# Patient Record
Sex: Male | Born: 1960
Health system: Southern US, Community
[De-identification: ages and names within clinical notes are randomized; demographics above are authoritative.]

## PROBLEM LIST (undated history)

## (undated) DIAGNOSIS — K6389 Other specified diseases of intestine: Secondary | ICD-10-CM

## (undated) DIAGNOSIS — I10 Essential (primary) hypertension: Secondary | ICD-10-CM

## (undated) DIAGNOSIS — E785 Hyperlipidemia, unspecified: Secondary | ICD-10-CM

## (undated) DIAGNOSIS — L989 Disorder of the skin and subcutaneous tissue, unspecified: Secondary | ICD-10-CM

## (undated) DIAGNOSIS — J342 Deviated nasal septum: Secondary | ICD-10-CM

## (undated) DIAGNOSIS — T7840XA Allergy, unspecified, initial encounter: Secondary | ICD-10-CM

## (undated) HISTORY — DX: Essential (primary) hypertension: I10

## (undated) HISTORY — DX: Other specified diseases of intestine: K63.89

## (undated) HISTORY — PX: WISDOM TOOTH EXTRACTION: SHX21

## (undated) HISTORY — DX: Hyperlipidemia, unspecified: E78.5

## (undated) HISTORY — DX: Allergy, unspecified, initial encounter: T78.40XA

## (undated) HISTORY — PX: VASECTOMY: SHX75

## (undated) HISTORY — PX: COLONOSCOPY: SHX174

## (undated) HISTORY — DX: Disorder of the skin and subcutaneous tissue, unspecified: L98.9

---

## 2005-04-01 ENCOUNTER — Ambulatory Visit: Payer: Self-pay | Admitting: Family Medicine

## 2006-10-06 ENCOUNTER — Ambulatory Visit: Payer: Self-pay | Admitting: Family Medicine

## 2006-10-06 LAB — CONVERTED CEMR LAB
Albumin: 4.5 g/dL (ref 3.5–5.2)
BUN: 12 mg/dL (ref 6–23)
Basophils Absolute: 0 10*3/uL (ref 0.0–0.1)
CO2: 29 meq/L (ref 19–32)
Calcium: 9.6 mg/dL (ref 8.4–10.5)
Cholesterol: 222 mg/dL (ref 0–200)
Glomerular Filtration Rate, Af Am: 116 mL/min/{1.73_m2}
Glucose, Bld: 89 mg/dL (ref 70–99)
HDL: 44.8 mg/dL (ref >39.0)
LDL DIRECT: 147.5 mg/dL
MCV: 94.4 fL (ref 78.0–100.0)
Monocytes Relative: 9 % (ref 3.0–11.0)
Neutrophils Relative %: 56.8 % (ref 43.0–77.0)
Platelets: 261 10*3/uL (ref 150–400)
RBC: 5 M/uL (ref 4.22–5.81)
Sodium: 143 meq/L (ref 135–145)
Total Bilirubin: 1 mg/dL (ref 0.3–1.2)
WBC: 6.1 10*3/uL (ref 4.5–10.5)

## 2006-10-13 ENCOUNTER — Ambulatory Visit: Payer: Self-pay | Admitting: Family Medicine

## 2008-05-20 DIAGNOSIS — R310 Gross hematuria: Secondary | ICD-10-CM | POA: Insufficient documentation

## 2008-05-21 ENCOUNTER — Ambulatory Visit: Payer: Self-pay | Admitting: Family Medicine

## 2008-05-21 LAB — CONVERTED CEMR LAB
Glucose, Urine, Semiquant: NEGATIVE
Protein, U semiquant: 30
Urobilinogen, UA: 0.2

## 2008-06-11 ENCOUNTER — Ambulatory Visit: Payer: Self-pay | Admitting: Family Medicine

## 2008-06-11 DIAGNOSIS — R030 Elevated blood-pressure reading, without diagnosis of hypertension: Secondary | ICD-10-CM | POA: Insufficient documentation

## 2008-06-11 LAB — CONVERTED CEMR LAB
Ketones, urine, test strip: NEGATIVE
Protein, U semiquant: NEGATIVE
Urobilinogen, UA: 0.2
WBC Urine, dipstick: NEGATIVE

## 2008-06-25 ENCOUNTER — Ambulatory Visit: Payer: Self-pay | Admitting: Family Medicine

## 2008-06-25 DIAGNOSIS — J309 Allergic rhinitis, unspecified: Secondary | ICD-10-CM | POA: Insufficient documentation

## 2008-06-25 DIAGNOSIS — I10 Essential (primary) hypertension: Secondary | ICD-10-CM | POA: Insufficient documentation

## 2008-08-01 ENCOUNTER — Ambulatory Visit: Payer: Self-pay | Admitting: Family Medicine

## 2008-09-28 DIAGNOSIS — M5137 Other intervertebral disc degeneration, lumbosacral region: Secondary | ICD-10-CM | POA: Insufficient documentation

## 2008-09-28 DIAGNOSIS — M51379 Other intervertebral disc degeneration, lumbosacral region without mention of lumbar back pain or lower extremity pain: Secondary | ICD-10-CM | POA: Insufficient documentation

## 2008-10-08 ENCOUNTER — Ambulatory Visit: Payer: Self-pay | Admitting: Family Medicine

## 2008-10-10 ENCOUNTER — Ambulatory Visit: Payer: Self-pay | Admitting: Family Medicine

## 2009-05-04 DIAGNOSIS — J3489 Other specified disorders of nose and nasal sinuses: Secondary | ICD-10-CM | POA: Insufficient documentation

## 2009-05-07 ENCOUNTER — Ambulatory Visit: Payer: Self-pay | Admitting: Family Medicine

## 2009-06-09 ENCOUNTER — Telehealth: Payer: Self-pay | Admitting: Family Medicine

## 2009-06-18 ENCOUNTER — Telehealth: Payer: Self-pay | Admitting: Family Medicine

## 2009-09-10 ENCOUNTER — Telehealth: Payer: Self-pay | Admitting: *Deleted

## 2010-04-02 ENCOUNTER — Ambulatory Visit: Payer: Self-pay | Admitting: Family Medicine

## 2010-04-02 LAB — CONVERTED CEMR LAB
ALT: 31 units/L (ref 0–53)
Blood in Urine, dipstick: NEGATIVE
CO2: 30 meq/L (ref 19–32)
Chloride: 109 meq/L (ref 96–112)
Cholesterol: 209 mg/dL — ABNORMAL HIGH (ref 0–200)
Creatinine, Ser: 0.9 mg/dL (ref 0.4–1.5)
Eosinophils Relative: 0.9 % (ref 0.0–5.0)
HCT: 44.2 % (ref 39.0–52.0)
HDL: 41.7 mg/dL (ref >39.00)
Ketones, urine, test strip: NEGATIVE
Lymphocytes Relative: 25.2 % (ref 12.0–46.0)
MCHC: 34.7 g/dL (ref 30.0–36.0)
Monocytes Relative: 6.1 % (ref 3.0–12.0)
PSA: 0.37 ng/mL (ref 0.10–4.00)
Platelets: 243 10*3/uL (ref 150.0–400.0)
Potassium: 4.4 meq/L (ref 3.5–5.1)
RBC: 4.75 M/uL (ref 4.22–5.81)
Specific Gravity, Urine: 1.02
TSH: 1.11 microintl units/mL (ref 0.35–5.50)
Total Bilirubin: 0.5 mg/dL (ref 0.3–1.2)
Total Protein: 7.3 g/dL (ref 6.0–8.3)
Triglycerides: 116 mg/dL (ref 0.0–149.0)
Urobilinogen, UA: 0.2
WBC Urine, dipstick: NEGATIVE

## 2010-04-09 ENCOUNTER — Ambulatory Visit: Payer: Self-pay | Admitting: Family Medicine

## 2010-04-09 DIAGNOSIS — L2089 Other atopic dermatitis: Secondary | ICD-10-CM | POA: Insufficient documentation

## 2010-04-09 DIAGNOSIS — N529 Male erectile dysfunction, unspecified: Secondary | ICD-10-CM | POA: Insufficient documentation

## 2010-04-13 ENCOUNTER — Ambulatory Visit: Payer: Self-pay | Admitting: Family Medicine

## 2010-04-14 ENCOUNTER — Telehealth: Payer: Self-pay | Admitting: Family Medicine

## 2010-08-18 ENCOUNTER — Encounter: Payer: Self-pay | Admitting: Family Medicine

## 2011-01-18 ENCOUNTER — Telehealth: Payer: Self-pay | Admitting: Family Medicine

## 2011-01-26 NOTE — Progress Notes (Signed)
Summary: Pt returning call re: xray results. Pls call back asap  Phone Note Call from Patient Call back at [phone] cell   Caller: Patient Summary of Call: Pt returned call re: xray results. Pls call back asap.  Initial call taken by: Lucy Antigua,  April 14, 2010 8:41 AM  Follow-up for Phone Call        Phone Call Completed Follow-up by: Kern Reap CMA Duncan Dull),  April 14, 2010 9:03 AM

## 2011-01-26 NOTE — Assessment & Plan Note (Signed)
Summary: CPX // RS   Vital Signs:  Patient profile:   50 year old male Height:      71.25 inches Weight:      205 pounds BMI:     28.49 Temp:     98.4 degrees F oral BP sitting:   120 / 86  (left arm) Cuff size:   regular  Vitals Entered By: Kern Reap CMA Duncan Dull) (April 09, 2010 3:49 PM) CC: cpx   CC:  cpx.  History of Present Illness: Sherri is a 50 year old, married male, nonsmoker comes in today for physical examination  He has underlying hypertension, treated with Zestoretic 20 -- 12.5 daily.  BP 120/86.  He's been off smokeless tobacco now for about a year.  He tried to stand checks, but he had side effects.  He used patches finally, he use the nicotine gum.  He strained position from nicotine gum now to regular gum.  These concerned now because he chews gum.  All day long.  He has some postnasal drip.  No change in his voice.  No hemoptysis, etc., but he is worried about throat cancer.  He gets routine eye care.  Dental care.  Colonoscopy will be done.  This year.  Tetanus 1999.  He also symptoms of postnasal drip allergic rhinitis.  Will try him on a trial of plain Zyrtec at bedtime  He's also complaining of right-sided low back pain, which he's had for many years.  Now he states the pain is pretty much constant it's a 6 on a scale of one to 10.  It is relieved by rest, increased by sitting.  No history of trauma.  It does not radiate down his legs.  He denies any neurologic symptoms.  He also has some mild ED, and would like to discuss treatment options  Allergies (verified): No Known Drug Allergies  Past History:  Past medical, surgical, family and social histories (including risk factors) reviewed, and no changes noted (except as noted below).  Past Medical History: Reviewed history from 06/25/2008 and no changes required. Allergic rhinitis Hypertension  Family History: Reviewed history from 06/11/2008 and no changes required. Family History  Hypertension  Social History: Reviewed history from 06/11/2008 and no changes required. Occupation: Married Never Smoked Alcohol use-no Drug use-no Regular exercise-yes  Review of Systems      See HPI  Physical Exam  General:  Well-developed,well-nourished,in no acute distress; alert,appropriate and cooperative throughout examination Head:  Normocephalic and atraumatic without obvious abnormalities. No apparent alopecia or balding. Eyes:  No corneal or conjunctival inflammation noted. EOMI. Perrla. Funduscopic exam benign, without hemorrhages, exudates or papilledema. Vision grossly normal. Ears:  External ear exam shows no significant lesions or deformities.  Otoscopic examination reveals clear canals, tympanic membranes are intact bilaterally without bulging, retraction, inflammation or discharge. Hearing is grossly normal bilaterally. Nose:  External nasal examination shows no deformity or inflammation. Nasal mucosa are pink and moist without lesions or exudates. Mouth:  Oral mucosa and oropharynx without lesions or exudates.  Teeth in good repair. Neck:  No deformities, masses, or tenderness noted. Chest Wall:  No deformities, masses, tenderness or gynecomastia noted. Breasts:  No masses or gynecomastia noted Lungs:  Normal respiratory effort, chest expands symmetrically. Lungs are clear to auscultation, no crackles or wheezes. Heart:  Normal rate and regular rhythm. S1 and S2 normal without gallop, murmur, click, rub or other extra sounds. Abdomen:  Bowel sounds positive,abdomen soft and non-tender without masses, organomegaly or hernias noted. Rectal:  No  external abnormalities noted. Normal sphincter tone. No rectal masses or tenderness. Genitalia:  Testes bilaterally descended without nodularity, tenderness or masses. No scrotal masses or lesions. No penis lesions or urethral discharge. Prostate:  Prostate gland firm and smooth, no enlargement, nodularity, tenderness, mass,  asymmetry or induration. Msk:  No deformity or scoliosis noted of thoracic or lumbar spine.   Pulses:  R and L carotid,radial,femoral,dorsalis pedis and posterior tibial pulses are full and equal bilaterally Extremities:  No clubbing, cyanosis, edema, or deformity noted with normal full range of motion of all joints.   Neurologic:  No cranial nerve deficits noted. Station and gait are normal. Plantar reflexes are down-going bilaterally. DTRs are symmetrical throughout. Sensory, motor and coordinative functions appear intact. Skin:  Intact without suspicious lesions or rashes Cervical Nodes:  No lymphadenopathy noted Axillary Nodes:  No palpable lymphadenopathy Inguinal Nodes:  No significant adenopathy Psych:  Cognition and judgment appear intact. Alert and cooperative with normal attention span and concentration. No apparent delusions, illusions, hallucinations   Problems:  Medical Problems Added: 1)  Dx of Preventive Health Care  (ICD-V70.0) 2)  Dx of Eczema, Atopic  (ICD-691.8) 3)  Dx of Erectile Dysfunction, Organic  (VWU-981.19)  Impression & Recommendations:  Problem # 1:  DISC DISEASE, LUMBAR (ICD-722.52) Assessment Deteriorated  Orders: T-Lumbar Spine w/Flex & Ext 4 Views (14782NF)  Problem # 2:  HYPERTENSION (ICD-401.9) Assessment: Improved  His updated medication list for this problem includes:    Zestoretic 10-12.5 Mg Tabs (Lisinopril-hydrochlorothiazide) .Marland Kitchen... Take 1 tablet by mouth every morning  Orders: EKG w/ Interpretation (93000)  Problem # 3:  ALLERGIC RHINITIS (ICD-477.9) Assessment: Unchanged  Problem # 4:  GROSS HEMATURIA (ICD-599.71) Assessment: Improved  His updated medication list for this problem includes:    Keflex 500 Mg Caps (Cephalexin) .Marland Kitchen... 2 by mouth two times a day  Problem # 5:  ERECTILE DYSFUNCTION, ORGANIC (ICD-607.84) Assessment: New  His updated medication list for this problem includes:    Viagra 50 Mg Tabs (Sildenafil citrate)  ..... Uad  Problem # 6:  ECZEMA, ATOPIC (ICD-691.8)  His updated medication list for this problem includes:    Triamcinolone Acetonide 0.5 % Crea (Triamcinolone acetonide) .Marland Kitchen... Apply to feet two times a day    Fluocinonide 0.05 % Oint (Fluocinonide) .Marland Kitchen... Compound with salac 10% apply to feet two times a day  Complete Medication List: 1)  Zestoretic 10-12.5 Mg Tabs (Lisinopril-hydrochlorothiazide) .... Take 1 tablet by mouth every morning 2)  Keflex 500 Mg Caps (Cephalexin) .... 2 by mouth two times a day 3)  Triamcinolone Acetonide 0.5 % Crea (Triamcinolone acetonide) .... Apply to feet two times a day 4)  Fluocinonide 0.05 % Oint (Fluocinonide) .... Compound with salac 10% apply to feet two times a day 5)  Viagra 50 Mg Tabs (Sildenafil citrate) .... Uad  Other Orders: Gastroenterology Referral (GI) Tdap => 25yrs IM (62130) Admin 1st Vaccine (86578)  Patient Instructions: 1)  call Dr. Narda Bonds, ENT, for consultation. 2)  Go to the main office for your back.  X-rays .  I will call u   The report and we  will go from there on that issue 3)  Viagra 50 mg one half tablet one hour prior to sex. 4)  Continue the Zestoretic one tab daily for your hypertension. 5)  Takes Zyrtec 10 mg plain ...n o d ...... at bedtime to see if this helps with the postnasal drip 6)  y r  due for a screening colonoscopy.  I  will set that up for you Prescriptions: FLUOCINONIDE 0.05 % OINT (FLUOCINONIDE) compound with salac 10% apply to feet two times a day  #60 gr x 6   Entered and Authorized by:   Roderick Pee MD   Signed by:   Roderick Pee MD on 04/09/2010   Method used:   Electronically to        [facility]* (retail)       [address]       [place], [place]  [address]       Ph: [phone]       Fax: [phone]   RxID:   [id number] ZESTORETIC 10-12.5 MG  TABS (LISINOPRIL-HYDROCHLOROTHIAZIDE) Take 1 tablet by mouth every morning  #100 x 3   Entered and  Authorized by:   Roderick Pee MD   Signed by:   Roderick Pee MD on 04/09/2010   Method used:   Electronically to        [facility]* (retail)       [address]       [place], [place]  [address]       Ph: [phone]       Fax: [phone]   RxID:   [id number] VIAGRA 50 MG TABS (SILDENAFIL CITRATE) UAD  #6 x 11   Entered and Authorized by:   Roderick Pee MD   Signed by:   Roderick Pee MD on 04/09/2010   Method used:   Electronically to        [facility]* (retail)       [address]       [place], [place]  [address]       Ph: [phone]       Fax: [phone]   RxID:   [id number]    Immunizations Administered:  Tetanus Vaccine:    Vaccine Type: Tdap    Site: left deltoid    Mfr: GlaxoSmithKline    Dose: 0.5 ml    Route: IM    Given by: Kern Reap CMA (AAMA)    Exp. Date: 03/20/2012    Lot #: [id number]    Physician counseled: yes

## 2011-01-26 NOTE — Letter (Signed)
Summary: Referral - not able to see patient  [facility]  [address] [place], [place] [address]   Phone: [phone]  Fax: [phone]    August 18, 2010    Tinnie Gens A. Tawanna Cooler, M.D. [address] [place], [place] [address]   Re:   Jesse Arroyo DOB:  [birth year] MRN:   [id number]    Dear Dr. Tawanna Cooler:  Thank you for your kind referral of the above patient.  We have attempted to schedule the recommended procedure Screening Colonoscopy but have not been able to schedule because:   X  The patient was not available by phone and/or has not returned our calls.  ___ The patient declined to schedule the procedure at this time.  We appreciate the referral and hope that we will have the opportunity to treat this patient in the future.    Sincerely,    [facility] [phone]

## 2011-01-28 NOTE — Progress Notes (Signed)
Summary: Pt req refill of antibiotic for nose infection. [facility] Note Call from Patient Call back at Home Phone [phone]   Caller: spouse - Britta Mccreedy Summary of Call: Pt has gotten the same infection up in his nose, as he did before. Pt is req the antibiotic Dr Tawanna Cooler prescribed before, but maybe stronger, since infection keep recurring. If pt needs ov, pls advise. Otherwise, [facility]s [facility] [address] [phone] Initial call taken by: Lucy Antigua,  January 18, 2011 2:33 PM    New/Updated Medications: DOXYCYCLINE HYCLATE 100 MG CAPS (DOXYCYCLINE HYCLATE) Take 1 tablet by mouth two times a day SEPTRA DS 800-160 MG TABS (SULFAMETHOXAZOLE-TRIMETHOPRIM) Take 1 tablet by mouth two times a day Prescriptions: SEPTRA DS 800-160 MG TABS (SULFAMETHOXAZOLE-TRIMETHOPRIM) Take 1 tablet by mouth two times a day  #30 x 2   Entered and Authorized by:   Roderick Pee MD   Signed by:   Roderick Pee MD on 01/18/2011   Method used:   Electronically to        [phone][facility] (retail)       [address]       [place], [place]  [address]       Ph: [phone] or [phone]       Fax: [phone]   RxID:   [id number] DOXYCYCLINE HYCLATE 100 MG CAPS (DOXYCYCLINE HYCLATE) Take 1 tablet by mouth two times a day  #30 x 2   Entered and Authorized by:   Roderick Pee MD   Signed by:   Roderick Pee MD on 01/18/2011   Method used:   Electronically to        [phone] (retail)       [address]       [place], [place]  [address]       Ph: [phone] or [phone]       Fax: [phone]   RxID:   [id number]

## 2011-05-19 ENCOUNTER — Encounter: Payer: Self-pay | Admitting: Family Medicine

## 2011-05-20 ENCOUNTER — Ambulatory Visit: Payer: Self-pay | Admitting: Family Medicine

## 2011-06-22 ENCOUNTER — Other Ambulatory Visit: Payer: Self-pay | Admitting: Family Medicine

## 2011-12-28 HISTORY — PX: COLON RESECTION: SHX5231

## 2012-01-20 ENCOUNTER — Encounter: Payer: Self-pay | Admitting: Family Medicine

## 2012-01-20 ENCOUNTER — Ambulatory Visit (INDEPENDENT_AMBULATORY_CARE_PROVIDER_SITE_OTHER): Payer: 59 | Admitting: Family Medicine

## 2012-01-20 DIAGNOSIS — K5900 Constipation, unspecified: Secondary | ICD-10-CM

## 2012-01-20 DIAGNOSIS — K5909 Other constipation: Secondary | ICD-10-CM

## 2012-01-20 DIAGNOSIS — L2089 Other atopic dermatitis: Secondary | ICD-10-CM

## 2012-01-20 DIAGNOSIS — M722 Plantar fascial fibromatosis: Secondary | ICD-10-CM

## 2012-01-20 MED ORDER — TRIAMCINOLONE ACETONIDE 0.5 % EX CREA: 1.0000 "application " | TOPICAL_CREAM | Freq: Three times a day (TID) | CUTANEOUS | Status: DC

## 2012-01-20 NOTE — Progress Notes (Signed)
  Subjective:    Patient ID: Jesse Arroyo, male    DOB: 29-Aug-1961, 51 y.o.   MRN: [id number]  HPI Jesse Arroyo is a 51 year old, married man nonsmoker, who comes in today for evaluation of 3 problems.  He states for the past 3 months.  He said changes in his bowel habits.  He feels like he has to go and can go.  He's tried some fleets enemas to no avail.  He has some discomfort in the right lower quadrant.  It comes and goes, and he describes Pencil  type stools.  He said no fever, nausea, vomiting, diarrhea.  He also has a lot of gas.  Some of which sounds like a lactase issue.  We had asked him to get a colonoscopy when he turns 48, but he hasn't done so yet.  Family history negative for GI disease.  He also has pain in his right heel for the past couple weeks.  He also has eczema and needs a refill on a steroid cream and he has an ingrown the fifth toenail.   Review of Systems    General GI, musculoskeletal review of systems otherwise negative Objective:   Physical Exam  Well-developed well-nourished, male in no acute distress.  Examination the abdomen the abdomen is flat.  The bowel sounds are normal.  There is some tenderness right lower quadrant.  No rebound.  No palpable masses.  Rectal exam normal prostate normal stool guaiac-negative.  The exam of the right foot shows tenderness consistent in the heel with plantar fasciitis.  He has an ingrown fifth toenail that I trimmed and excised.  He has eczema on his feet.      Assessment & Plan:  Symptoms of gas and change in bowel habits.  Plan lactose-free diet, stool softener, GI consult ASAP for colonoscopy.  Plantar fasciitis Motrin, 600 b.i.d. Exercises given.  Eczema.  Triamcinolone with moisturizing cream nightly.  Ingrown toenail fifth right foot, soak an d file   weekly

## 2012-01-20 NOTE — Patient Instructions (Signed)
To apply triamcinolone, and a moisturizing cream nightly   Soak  and follow the right fifth toenail weekly and fill it you can remove the nail completely.  Complete lactose free diet.  We will set you up in GI ASAP for a colonoscopy.  Motrin 600 mg twice daily with food and stretching exercises daily in the morning before you get out of bed for the plantar fasciitis.  Milk of Magnesia 3 tablespoons twice daily

## 2012-01-21 ENCOUNTER — Encounter: Payer: Self-pay | Admitting: Gastroenterology

## 2012-02-04 ENCOUNTER — Ambulatory Visit: Payer: 59 | Admitting: Gastroenterology

## 2012-02-08 ENCOUNTER — Encounter: Payer: Self-pay | Admitting: Gastroenterology

## 2012-02-08 ENCOUNTER — Ambulatory Visit (INDEPENDENT_AMBULATORY_CARE_PROVIDER_SITE_OTHER): Payer: 59 | Admitting: Gastroenterology

## 2012-02-08 VITALS — BP 118/68 | HR 76 | Ht 72.0 in | Wt 212.0 lb

## 2012-02-08 DIAGNOSIS — K5909 Other constipation: Secondary | ICD-10-CM

## 2012-02-08 DIAGNOSIS — K5900 Constipation, unspecified: Secondary | ICD-10-CM

## 2012-02-08 NOTE — Patient Instructions (Signed)
You will need to call back to schedule your colonoscopy recommended to you by Dr Arlyce Dice at your convenience You will need to call the office at [phone] You will be scheduled with a Previsit nurse at that time to sign paperwork and get instructions There is no fee for that visit

## 2012-02-08 NOTE — Assessment & Plan Note (Signed)
Although he has daily bowel movement it apparently is incomplete with a large amount of straining. He likely has chronic idiopathic constipation. A structural abnormality of the colon is less likely.  Recommendations #1 colonoscopy #2 if no abnormalities are seen a colonoscopy to explain constipation he will consider enrollment in a constipation trial

## 2012-02-08 NOTE — Progress Notes (Signed)
History of Present Illness: [name] is a pleasant 51 year old white male referred at the request of Dr. Tawanna Arroyo for evaluation of constipation. For years bowel movements have consisted of poorly formed stool. He constantly has the sense of incomplete evacuation. He has to struggle and strain to pass a stool which is often pencil thin or quite small. He denies abdominal pain, per se. There is no history of rectal bleeding.    Past Medical History  Diagnosis Date  . Allergy   . Hypertension    History reviewed. No pertinent past surgical history. family history includes Hypertension in his other. Current Outpatient Prescriptions  Medication Sig Dispense Refill  . fluocinonide (LIDEX) 0.05 % cream Apply 1 application topically 2 (two) times daily.        Marland Kitchen lisinopril-hydrochlorothiazide (PRINZIDE,ZESTORETIC) 10-12.5 MG per tablet TAKE 1 TABLET EVERY MORNING  100 tablet  2  . sildenafil (VIAGRA) 50 MG tablet Take 50 mg by mouth daily as needed.        . triamcinolone cream (KENALOG) 0.5 % Apply 1 application topically 3 (three) times daily. Apply nightly  60 g  4   Allergies as of 02/08/2012  . (No Known Allergies)    reports that he has never smoked. He has never used smokeless tobacco. He reports that he does not drink alcohol or use illicit drugs.     Review of Systems: He complains of sinus problems and nonproductive cough Pertinent positive and negative review of systems were noted in the above HPI section. All other review of systems were otherwise negative.  Vital signs were reviewed in today's medical record Physical Exam: General: Well developed , well nourished, no acute distress Head: Normocephalic and atraumatic Eyes:  sclerae anicteric, EOMI Ears: Normal auditory acuity Mouth: No deformity or lesions Neck: Supple, no masses or thyromegaly Lungs: Clear throughout to auscultation Heart: Regular rate and rhythm; no murmurs, rubs or bruits Abdomen: Soft, non tender and non  distended. No masses, hepatosplenomegaly or hernias noted. Normal Bowel sounds Rectal:deferred Musculoskeletal: Symmetrical with no gross deformities  Skin: No lesions on visible extremities Pulses:  Normal pulses noted Extremities: No clubbing, cyanosis, edema or deformities noted Neurological: Alert oriented x 4, grossly nonfocal Cervical Nodes:  No significant cervical adenopathy Inguinal Nodes: No significant inguinal adenopathy Psychological:  Alert and cooperative. Normal mood and affect

## 2012-03-03 ENCOUNTER — Encounter: Payer: Self-pay | Admitting: Gastroenterology

## 2012-03-10 ENCOUNTER — Encounter: Payer: Self-pay | Admitting: Gastroenterology

## 2012-03-10 ENCOUNTER — Ambulatory Visit (AMBULATORY_SURGERY_CENTER): Payer: 59 | Admitting: *Deleted

## 2012-03-10 VITALS — Ht 72.0 in | Wt 211.0 lb

## 2012-03-10 DIAGNOSIS — K5900 Constipation, unspecified: Secondary | ICD-10-CM

## 2012-03-10 MED ORDER — PEG-KCL-NACL-NASULF-NA ASC-C 100 G PO SOLR: ORAL | Status: DC

## 2012-03-23 ENCOUNTER — Encounter: Payer: Self-pay | Admitting: Gastroenterology

## 2012-03-23 ENCOUNTER — Other Ambulatory Visit: Payer: Self-pay | Admitting: Gastroenterology

## 2012-03-23 ENCOUNTER — Telehealth: Payer: Self-pay

## 2012-03-23 ENCOUNTER — Ambulatory Visit (AMBULATORY_SURGERY_CENTER): Payer: 59 | Admitting: Gastroenterology

## 2012-03-23 VITALS — BP 117/78 | HR 75 | Temp 98.0°F | Resp 16 | Ht 72.0 in | Wt 211.0 lb

## 2012-03-23 DIAGNOSIS — K6389 Other specified diseases of intestine: Secondary | ICD-10-CM

## 2012-03-23 DIAGNOSIS — K5900 Constipation, unspecified: Secondary | ICD-10-CM

## 2012-03-23 MED ORDER — SODIUM CHLORIDE 0.9 % IV SOLN: 500.0000 mL | INTRAVENOUS | Status: DC

## 2012-03-23 NOTE — Op Note (Signed)
[facility] [address] [place], [place]  [address]  COLONOSCOPY PROCEDURE REPORT  PATIENT:  Jesse, Arroyo  MR#:  [id number] BIRTHDATE:  16-Apr-1961, 50 yrs. old  GENDER:  male ENDOSCOPIST:  Barbette Hair. Arlyce Dice, MD REF. BY:  Tinnie Gens A. Tawanna Cooler, M.D. PROCEDURE DATE:  03/23/2012 PROCEDURE:  Colonoscopy with biopsy ASA CLASS:  Class I INDICATIONS:  constipation MEDICATIONS:   MAC sedation, administered by CRNA propofol 200mg IV  DESCRIPTION OF PROCEDURE:   After the risks benefits and alternatives of the procedure were thoroughly explained, informed consent was obtained.  Digital rectal exam was performed and revealed no abnormalities.   The LB 180AL [id number] endoscope was introduced through the anus and advanced to the terminal ileum which was intubated for a short distance, without limitations. The quality of the prep was excellent, using MoviPrep.  The instrument was then slowly withdrawn as the colon was fully examined. <<PROCEDUREIMAGES>>  FINDINGS:  A tubulovillous mass was found at the ileocecal valve. Villous appearing mass encompassing the ileocecal valve and extending outward into cecum. Biopsies taken (see image3, image9, and image4).  The terminal ileum appeared normal (see image6). This was otherwise a normal examination of the colon (see image1 and image10).   Retroflexed views in the rectum revealed no abnormalities.    The time to cecum =  1) 2.75  minutes. The scope was then withdrawn in  1) 13.0  minutes from the cecum and the procedure completed. COMPLICATIONS:  None ENDOSCOPIC IMPRESSION: 1) Tubulovillous mass at the ileocecal valve 2) Normal terminal ileum 3) Otherwise normal examination RECOMMENDATIONS: 1) Surgery REPEAT EXAM:  In 1 year(s) for Colonoscopy.  ______________________________ Barbette Hair. Arlyce Dice, MD  CC:  n. eSIGNED:   Barbette Hair. Froylan Hobby at 03/23/2012 11:34 AM  Tonye Royalty, [phone]

## 2012-03-23 NOTE — Progress Notes (Signed)
PROPOFOL PER S CAMP CRNA. SEE SCANNED INTRA PROCEDURE REPORT. EWM 

## 2012-03-23 NOTE — Telephone Encounter (Signed)
Spoke with pt and he is aware of appt date and time with Dr. Donell Beers.

## 2012-03-23 NOTE — Telephone Encounter (Signed)
Message copied by Chrystie Nose on Thu Mar 23, 2012  3:49 PM ------      Message from: Marnette Burgess      Created: Thu Mar 23, 2012  3:45 PM      Regarding: RE: Appointment       Patient is scheduled to be seen by Dr. Almond Lint on 04/14/12 @ 9:00am, arrive @ 8:30am.  If you have any questions please call [phone].            Thank You,      Elane Fritz      ----- Message -----         From: Lily Lovings, RN         Sent: 03/23/2012   3:21 PM           To: Marnette Burgess      Subject: Appointment                                              Elane Fritz,            This pt needs an appt with Dr. Donell Beers for surgical consult. Referral in Epic.            Thanks,      Bonita Quin            ----- Message -----         From: Louis Meckel, MD         Sent: 03/23/2012  11:52 AM           To: Almond Lint, MD, Lily Lovings, RN            Darrick Huntsman,      This pt underwent colonoscopy today for severe constipation.  He has a villous appearing mass at the ileocecal valve.  Biopsies are pending but nevertheless will require surgical resection.            My office will make an appointment.

## 2012-03-23 NOTE — Patient Instructions (Signed)
YOU HAD AN ENDOSCOPIC PROCEDURE TODAY AT THE [facility]: Refer to the procedure report that was given to you for any specific questions about what was found during the examination.  If the procedure report does not answer your questions, please call your gastroenterologist to clarify.  If you requested that your care partner not be given the details of your procedure findings, then the procedure report has been included in a sealed envelope for you to review at your convenience later.  YOU SHOULD EXPECT: Some feelings of bloating in the abdomen. Passage of more gas than usual.  Walking can help get rid of the air that was put into your GI tract during the procedure and reduce the bloating. If you had a lower endoscopy (such as a colonoscopy or flexible sigmoidoscopy) you may notice spotting of blood in your stool or on the toilet paper. If you underwent a bowel prep for your procedure, then you may not have a normal bowel movement for a few days.  DIET: Your first meal following the procedure should be a light meal and then it is ok to progress to your normal diet.  A half-sandwich or bowl of soup is an example of a good first meal.  Heavy or fried foods are harder to digest and may make you feel nauseous or bloated.  Likewise meals heavy in dairy and vegetables can cause extra gas to form and this can also increase the bloating.  Drink plenty of fluids but you should avoid alcoholic beverages for 24 hours.  ACTIVITY: Your care partner should take you home directly after the procedure.  You should plan to take it easy, moving slowly for the rest of the day.  You can resume normal activity the day after the procedure however you should NOT DRIVE or use heavy machinery for 24 hours (because of the sedation medicines used during the test).    SYMPTOMS TO REPORT IMMEDIATELY: A gastroenterologist can be reached at any hour.  During normal business hours, 8:30 AM to 5:00 PM Monday through Friday,  call [phone].  After hours and on weekends, please call the GI answering service at [phone] who will take a message and have the physician on call contact you.   Following lower endoscopy (colonoscopy or flexible sigmoidoscopy):  Excessive amounts of blood in the stool  Significant tenderness or worsening of abdominal pains  Swelling of the abdomen that is new, acute  Fever of 100F or higher  Following upper endoscopy (EGD)  Vomiting of blood or coffee ground material  New chest pain or pain under the shoulder blades  Painful or persistently difficult swallowing  New shortness of breath  Fever of 100F or higher  Black, tarry-looking stools  FOLLOW UP: If any biopsies were taken you will be contacted by phone or by letter within the next 1-3 weeks.  Call your gastroenterologist if you have not heard about the biopsies in 3 weeks.  Our staff will call the home number listed on your records the next business day following your procedure to check on you and address any questions or concerns that you may have at that time regarding the information given to you following your procedure. This is a courtesy call and so if there is no answer at the home number and we have not heard from you through the emergency physician on call, we will assume that you have returned to your regular daily activities without incident.  SIGNATURES/CONFIDENTIALITY: You and/or your care   partner have signed paperwork which will be entered into your electronic medical record.  These signatures attest to the fact that that the information above on your After Visit Summary has been reviewed and is understood.  Full responsibility of the confidentiality of this discharge information lies with you and/or your care-partner. YOU HAD AN ENDOSCOPIC PROCEDURE TODAY AT THE  ENDOSCOPY CENTER: Refer to the procedure report that was given to you for any specific questions about what was found during the  examination.  If the procedure report does not answer your questions, please call your gastroenterologist to clarify.  If you requested that your care partner not be given the details of your procedure findings, then the procedure report has been included in a sealed envelope for you to review at your convenience later.  YOU SHOULD EXPECT: Some feelings of bloating in the abdomen. Passage of more gas than usual.  Walking can help get rid of the air that was put into your GI tract during the procedure and reduce the bloating. If you had a lower endoscopy (such as a colonoscopy or flexible sigmoidoscopy) you may notice spotting of blood in your stool or on the toilet paper. If you underwent a bowel prep for your procedure, then you may not have a normal bowel movement for a few days.  DIET: Your first meal following the procedure should be a light meal and then it is ok to progress to your normal diet.  A half-sandwich or bowl of soup is an example of a good first meal.  Heavy or fried foods are harder to digest and may make you feel nauseous or bloated.  Likewise meals heavy in dairy and vegetables can cause extra gas to form and this can also increase the bloating.  Drink plenty of fluids but you should avoid alcoholic beverages for 24 hours.  ACTIVITY: Your care partner should take you home directly after the procedure.  You should plan to take it easy, moving slowly for the rest of the day.  You can resume normal activity the day after the procedure however you should NOT DRIVE or use heavy machinery for 24 hours (because of the sedation medicines used during the test).    SYMPTOMS TO REPORT IMMEDIATELY: A gastroenterologist can be reached at any hour.  During normal business hours, 8:30 AM to 5:00 PM Monday through Friday, call [phone].  After hours and on weekends, please call the GI answering service at [phone] who will take a message and have the physician on call contact  you.   Following lower endoscopy (colonoscopy or flexible sigmoidoscopy):  Excessive amounts of blood in the stool  Significant tenderness or worsening of abdominal pains  Swelling of the abdomen that is new, acute  Fever of 100F or higher  Following upper endoscopy (EGD)  Vomiting of blood or coffee ground material  New chest pain or pain under the shoulder blades  Painful or persistently difficult swallowing  New shortness of breath  Fever of 100F or higher  Black, tarry-looking stools  FOLLOW UP: If any biopsies were taken you will be contacted by phone or by letter within the next 1-3 weeks.  Call your gastroenterologist if you have not heard about the biopsies in 3 weeks.  Our staff will call the home number listed on your records the next business day following your procedure to check on you and address any questions or concerns that you may have at that time regarding the information given to   you following your procedure. This is a courtesy call and so if there is no answer at the home number and we have not heard from you through the emergency physician on call, we will assume that you have returned to your regular daily activities without incident.  SIGNATURES/CONFIDENTIALITY: You and/or your care partner have signed paperwork which will be entered into your electronic medical record.  These signatures attest to the fact that that the information above on your After Visit Summary has been reviewed and is understood.  Full responsibility of the confidentiality of this discharge information lies with you and/or your care-partner.  

## 2012-03-23 NOTE — Progress Notes (Signed)
Patient did not experience any of the following events: a burn prior to discharge; a fall within the facility; wrong site/side/patient/procedure/implant event; or a hospital transfer or hospital admission upon discharge from the facility. (G8907) Patient did not have preoperative order for IV antibiotic SSI prophylaxis. (G8918)  

## 2012-03-27 ENCOUNTER — Telehealth: Payer: Self-pay | Admitting: *Deleted

## 2012-03-27 NOTE — Telephone Encounter (Signed)
  Follow up Call-  Call back number 03/23/2012  Post procedure Call Back phone  # [phone]  Permission to leave phone message Yes     Patient questions:  Do you have a fever, pain , or abdominal swelling? no Pain Score  0 *  Have you tolerated food without any problems? yes  Have you been able to return to your normal activities? yes  Do you have any questions about your discharge instructions: Diet   no Medications  no Follow up visit  no  Do you have questions or concerns about your Care? no  Actions: * If pain score is 4 or above: No action needed, pain <4.

## 2012-03-27 NOTE — Telephone Encounter (Signed)
Left a message for patient to call me. 

## 2012-03-27 NOTE — Telephone Encounter (Signed)
Message copied by Daphine Deutscher on Mon Mar 27, 2012  3:37 PM ------      Message from: Melvia Heaps D      Created: Mon Mar 27, 2012  9:48 AM       Please contact the patient and inform him that the biopsies showed changes consistent with a polyp but no cancer is seen. He's scheduled to see by Dr. Donell Beers already for surgery      ----- Message -----         From: [facility]         Sent: 03/24/2012   3:51 PM           To: Louis Meckel, MD

## 2012-03-28 NOTE — Telephone Encounter (Signed)
Left a message for patient to call me. 

## 2012-03-29 NOTE — Telephone Encounter (Signed)
Spoke with patient and gave him results and recommendations by Dr. Arlyce Dice.

## 2012-04-14 ENCOUNTER — Encounter (INDEPENDENT_AMBULATORY_CARE_PROVIDER_SITE_OTHER): Payer: Self-pay | Admitting: General Surgery

## 2012-04-14 ENCOUNTER — Ambulatory Visit (INDEPENDENT_AMBULATORY_CARE_PROVIDER_SITE_OTHER): Payer: 59 | Admitting: General Surgery

## 2012-04-14 VITALS — BP 124/78 | HR 75 | Temp 97.0°F | Resp 14 | Ht 72.0 in | Wt 213.2 lb

## 2012-04-14 DIAGNOSIS — D126 Benign neoplasm of colon, unspecified: Secondary | ICD-10-CM

## 2012-04-14 NOTE — Progress Notes (Signed)
Chief Complaint  Patient presents with  . New Evaluation    colon mass consult for surgery    HISTORY: Patient is a 51-year-old male who was referred to Dr. Kaplan for colonoscopy. He has a history of loose stools and sometimes pencil thin stools. 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 Biopsies were negative for cancer. He also has had pain in the right lower quadrant recently. He describes it as crampy. He denies nausea or vomiting. He denies constipation. He has no family history of cancer, but he does have a family history of similar types of GI issues.   Past Medical History  Diagnosis Date  . Allergy   . Hypertension   . Skin abnormalities     feet    Past Surgical History  Procedure Date  . Wisdom tooth extraction     Current Outpatient Prescriptions  Medication Sig Dispense Refill  . aspirin 81 MG tablet Take 81 mg by mouth daily.      . fluocinonide (LIDEX) 0.05 % cream Apply 1 application topically 2 (two) times daily.        . lisinopril-hydrochlorothiazide (PRINZIDE,ZESTORETIC) 10-12.5 MG per tablet TAKE 1 TABLET EVERY MORNING  100 tablet  2  . Multiple Vitamin (MULTIVITAMIN) tablet Take 1 tablet by mouth daily.      . sildenafil (VIAGRA) 50 MG tablet Take 50 mg by mouth daily as needed.        . triamcinolone cream (KENALOG) 0.5 % Apply 1 application topically 3 (three) times daily. Apply nightly  60 g  4     No Known Allergies   Family History  Problem Relation Age of Onset  . Hypertension Other   . Lung cancer Father 75  . Colon cancer Neg Hx      History   Social History  . Marital Status: Married    Spouse Name: N/A    Number of Children: 2  . 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 Smoking status: Former Smoker    Quit date: 12/27/1993  . Smokeless tobacco: Former User    Types: Snuff    Quit date: 12/28/2007  . Alcohol Use: 2.4 oz/week    4  Glasses of wine per week  . Drug Use: No  . Sexually Active: Not on file   Other Topics Concern  . Not on file   Social History Narrative  . No narrative on file     REVIEW OF SYSTEMS - PERTINENT POSITIVES ONLY: 12 point review of systems negative other than HPI and PMH  EXAM: Filed Vitals:   04/14/12 0839  BP: 124/78  Pulse: 75  Temp: 97 F (36.1 C)  Resp: 14    Gen:  No acute distress.  Well nourished and well groomed.   Neurological: Alert and oriented to person, place, and time. Coordination normal.  Head: Normocephalic and atraumatic.  Eyes: Conjunctivae are normal. Pupils are equal, round, and reactive to light. No scleral icterus.  Neck: Normal range of motion. Neck supple. No tracheal deviation or thyromegaly present.  Cardiovascular: Normal rate, regular rhythm, normal heart sounds and intact distal pulses.  Exam reveals no gallop and no friction rub.  No murmur heard. Respiratory: Effort normal.  No respiratory distress. No chest wall tenderness. Breath sounds normal.  No wheezes, rales or rhonchi.  GI: Soft. Bowel sounds are normal. The abdomen is soft and   nontender.  There is no rebound and no guarding.  Musculoskeletal: Normal range of motion. Extremities are nontender.  Lymphadenopathy: No cervical, preauricular, postauricular or axillary adenopathy is present Skin: Skin is warm and dry. No rash noted. No diaphoresis. No erythema. No pallor. No clubbing, cyanosis, or edema.   Psychiatric: Normal mood and affect. Behavior is normal. Judgment and thought content normal.    LABORATORY RESULTS: None available.     RADIOLOGY RESULTS: None    ASSESSMENT AND PLAN: Tubulovillous adenoma of ileocecal valve Pt with flat adenoma at ileocecal valve. Biopsies are negative for cancer.  Plan ileocecectomy.    Complications such as the ones below were discussed with the patient and include: Bleeding Infection and possible wound complications such as  hernia Damage to adjacent structures Leak of surgical connections, which can lead to other surgeries and possibly an ostomy. (5-10%) Possible need for other procedures, such as abscess drains in radiology  Possible prolonged hospital stay Possible diarrhea from removal of part of the colon. Possible constipation from narcotics.    Prolonged fatigue/weakness/appetite MOST PATIENTS' ENERGY LEVEL IS NOT BACK TO NORMAL FOR AT LEAST 2-4 MONTHS.  OLDER PATIENTS MAY FEEL WEAK FOR LONGER PERIODS OF TIME.   Difficulty with eating or post operative nausea  Possible cancer in the polyp Possible complications of your medical problems such as heart disease or arrhythmias. Death (less than 1%)  Educational material was given to the patient.  He will do a one day miralax bowel prep.       Alyxis Grippi L Cheskel Silverio MD Surgical Oncology, General and Endocrine Surgery [facility]      Visit Diagnoses: 1. Tubulovillous adenoma of ileocecal valve     Primary Care Physician: TODD,JEFFREY ALLEN, MD, MD    

## 2012-04-14 NOTE — Patient Instructions (Signed)
Partial Removal of the colon Your medical providers are recommending removing part of your colon for a polyp at the ileocecal valve.  Based on the location and type of colon problem you have, the following will help describe what happens when you have this surgery.  TREATMENT Surgery is the most common treatment for these polyps. We remove the part of the colon with the polyp and some of the healthy tissue around it. This tissue includes an apron of tissue with lymph nodes and blood vessels.  We usually use laparoscopy (smaller incisions) to help minimize the size of the larger incision on your abdomen.  However, even if we use laparoscopy primarily, we still need to make an incision large enough to bring the two ends of the colon out to reconnect them.  For tumors that are very large or very adherent to the adjacent structures, we may have to make a larger incision to do your operation safely.   When a section of the colon or rectum is removed, we can usually reconnect the healthy parts. However, sometimes reconnection is not possible. In this case, we will make a new path for your bowel movements to leave the body. This is an opening (a stoma) in the wall of the abdomen. The upper end of the intestine is then connected to the stoma. The other end is closed. The operation to create the stoma is called a colostomy. A flat bag fits over the stoma to collect waste, and a special adhesive holds it in place.  Most patients are able to do their normal activities with a colostomy, including travelling, exercising, and swimming.     The part of colon that we plan to remove in your case is the cecum (right colon).  Whatever the operative findings, I will discuss the case with your family after we are done in the operating room.  I will talk to you in the next few days when you are more awake.  I will see you in the hospital every weekday that I am not out of town.  My partners help see patients on the weekends  and if I am out of town.    IF YOU ARE TAKING ASPIRIN, PLAVIX, COUMADIN, OR OTHER BLOOD THINNERS, LET us KNOW IMMEDIATELY SO WE CAN CONTACT YOUR PRESCRIBING HEALTH CARE PROVIDER TO HOLD THE MEDICATION FOR 5-7 DAYS BEFORE SURGERY    WHAT HAPPENS AFTER SURGERY: After surgery, you will go first to the recovery room, then to your room.   You will have many tubes and lines in place which is standard for this operation.  You will have several IVs, including possibly a central line in your chest or neck.  YOU WILL NOT BE ABLE TO EAT FOR SEVERAL DAYS AFTER SURGERY.  You will have a catheter in your bladder for around 1-3 days.  On your abdomen, you will have and possibly a pain pump with numbing medicine.  You will have compression stockings on your legs to decrease the risk of blood clots.    We will address your pain in several ways.  We will use an IV pain pump called a "PCA," or Patient Controlled Analgesia.  This allows you to press a button and immediately receive a dose of pain medication without waiting for a nurse.  We also use IV Tylenol and sometimes IV Toradol which is similar to ibuprofen.  You may also have a pump with numbing medicine delivered directly to your incision.  I use doses  and medications that work for the majority of people, but you may need an adjustment to the dose or type of medicine if your pain is not adequately controlled.  Your throat may be sore, in which case you may need a throat spray or lozenges.  Some patients become disoriented with the pain medication and may need adjustments due to that.    We will ask you to get out of bed the day after surgery in order to maximize your chances of not having complications.  Your risk of pneumonia and blood clots is lower with walking and sitting in the chair.  We will also ask you to perform breathing exercises.  We will also ask to you walk in your room and in the halls for the above reasons, but also in order for you to keep up your  strength.    EATING: We will usually start you on clear liquids in around 1-2 days if your bowel function seems to be returning.  We advance your diet slowly to make sure you are tolerating each step.  All patients do not have a normal appetite when they go home.  Many patients also find that their taste buds do not seem the same right after surgery.  We may need to use different doses or type of medicine than you use at home to control high blood pressure, diabetes, or other medical problems.   SLEEPING: We do not give sleeping medications in the hospital routinely.  Many patients have difficulty sleeping due to the unfamiliar environment or the medical care that occurs at night.  Sleeping medications can interfere with the pain medications and cause significant mental status changes that are dangerous.    GOING HOME! Usually you are able to go home in 4-8 days, depending on whether or not complications happen and what is going on with your overall health status.   If you have more health problems or if you have limited help at home, the therapists and nurses may recommend a temporary rehab or nursing facility to help you get back on your feet before you go home.  These decisions would be made while you are in the hospital with the assistance of a social worker or case manager.  You cannot do any heavy lifting for 6-8 weeks due to the risk of hernia.    Please bring all insurance/disability forms to our office for the staff to fill out   POSSIBLE COMPLICATIONS This is a major operation and includes complications listed below: Bleeding Infection and possible wound complications such as hernia Damage to adjacent structures Leak of surgical connections, which can lead to other surgeries and possibly an ostomy. (5-10%) Possible need for other procedures, such as abscess drains in radiology  Possible prolonged hospital stay Possible diarrhea from removal of part of the colon. Possible  constipation from narcotics.    Prolonged fatigue/weakness/appetite MOST PATIENTS' ENERGY LEVEL IS NOT BACK TO NORMAL FOR AT LEAST 2-4 MONTHS.  OLDER PATIENTS MAY FEEL WEAK FOR LONGER PERIODS OF TIME.   Difficulty with eating or post operative nausea  Possible small focus of cancer in polyp Possible complications of your medical problems such as heart disease or arrhythmias. Death (less than 1%)  All possible complications are not listed, just the most common.    FURTHER INFORMATION? Please ask questions if you find something that we did not discuss in the office and would like more information.  If you would like another appointment if you have many  questions or if your family members would like to come as well, please contact the office.

## 2012-04-14 NOTE — Assessment & Plan Note (Signed)
Pt with flat adenoma at ileocecal valve. Biopsies are negative for cancer.  Plan ileocecectomy.    Complications such as the ones below were discussed with the patient and include: Bleeding Infection and possible wound complications such as hernia Damage to adjacent structures Leak of surgical connections, which can lead to other surgeries and possibly an ostomy. (5-10%) Possible need for other procedures, such as abscess drains in radiology  Possible prolonged hospital stay Possible diarrhea from removal of part of the colon. Possible constipation from narcotics.    Prolonged fatigue/weakness/appetite MOST PATIENTS' ENERGY LEVEL IS NOT BACK TO NORMAL FOR AT LEAST 2-4 MONTHS.  OLDER PATIENTS MAY FEEL WEAK FOR LONGER PERIODS OF TIME.   Difficulty with eating or post operative nausea  Possible cancer in the polyp Possible complications of your medical problems such as heart disease or arrhythmias. Death (less than 1%)  Educational material was given to the patient.  He will do a one day miralax bowel prep.

## 2012-04-27 ENCOUNTER — Encounter (HOSPITAL_COMMUNITY): Payer: Self-pay | Admitting: Pharmacy Technician

## 2012-05-03 ENCOUNTER — Encounter (HOSPITAL_COMMUNITY)
Admission: RE | Admit: 2012-05-03 | Discharge: 2012-05-03 | Disposition: A | Discharge status: Home / Self Care | Payer: 59 | Source: Ambulatory Visit | Attending: General Surgery | Admitting: General Surgery

## 2012-05-03 ENCOUNTER — Encounter (HOSPITAL_COMMUNITY): Payer: Self-pay

## 2012-05-03 ENCOUNTER — Ambulatory Visit (HOSPITAL_COMMUNITY)
Admission: RE | Admit: 2012-05-03 | Discharge: 2012-05-03 | Disposition: A | Discharge status: Home / Self Care | Payer: 59 | Source: Ambulatory Visit | Attending: General Surgery | Admitting: General Surgery

## 2012-05-03 DIAGNOSIS — Z01812 Encounter for preprocedural laboratory examination: Secondary | ICD-10-CM | POA: Insufficient documentation

## 2012-05-03 DIAGNOSIS — I10 Essential (primary) hypertension: Secondary | ICD-10-CM | POA: Insufficient documentation

## 2012-05-03 DIAGNOSIS — Z01810 Encounter for preprocedural cardiovascular examination: Secondary | ICD-10-CM | POA: Insufficient documentation

## 2012-05-03 DIAGNOSIS — D126 Benign neoplasm of colon, unspecified: Secondary | ICD-10-CM | POA: Insufficient documentation

## 2012-05-03 LAB — BASIC METABOLIC PANEL
Chloride: 103 mEq/L (ref 96–112)
Creatinine, Ser: 0.98 mg/dL (ref 0.50–1.35)
GFR calc Af Amer: >90 mL/min (ref >90)
Potassium: 4.2 mEq/L (ref 3.5–5.1)

## 2012-05-03 LAB — CBC
Platelets: 238 10*3/uL (ref 150–400)
RDW: 13.3 % (ref 11.5–15.5)
WBC: 6.7 10*3/uL (ref 4.0–10.5)

## 2012-05-03 LAB — SURGICAL PCR SCREEN: MRSA, PCR: NEGATIVE

## 2012-05-03 NOTE — Patient Instructions (Signed)
18 Jesse Arroyo  05/03/2012   Your procedure is scheduled on:  05/10/12 100pm-300pm  Report to [facility] at 1030 AM.  Call this number if you have problems the morning of surgery: [phone]   Remember: One Day Bowel Prep    Do not eat food:After Midnight.  May have clear liquids:until Midnight .   Take these medicines the morning of surgery with A SIP OF WATER:   Do not wear jewelry, .  Do not wear lotions, powders, or perfumes. .    Do not bring valuables to the hospital.  Contacts, dentures or bridgework may not be worn into surgery.  Leave suitcase in the car. After surgery it may be brought to your room.  For patients admitted to the hospital, checkout time is 11:00 AM the day of discharge.      Special Instructions: CHG Shower Use Special Wash: 1/2 bottle night before surgery and 1/2 bottle morning of surgery. shower chin to toes with CHG.  Wash face and private parts with regular soap.     Please read over the following fact sheets that you were given: MRSA Information, coughing and deep breathing exercises, leg exercises

## 2012-05-10 ENCOUNTER — Encounter (HOSPITAL_COMMUNITY): Payer: Self-pay

## 2012-05-10 ENCOUNTER — Encounter (HOSPITAL_COMMUNITY)
Admission: RE | Disposition: A | Discharge status: Home / Self Care | Payer: Self-pay | Source: Ambulatory Visit | Attending: General Surgery

## 2012-05-10 ENCOUNTER — Encounter (HOSPITAL_COMMUNITY): Payer: Self-pay | Admitting: Anesthesiology

## 2012-05-10 ENCOUNTER — Ambulatory Visit (HOSPITAL_COMMUNITY): Payer: 59 | Admitting: Anesthesiology

## 2012-05-10 ENCOUNTER — Inpatient Hospital Stay (HOSPITAL_COMMUNITY)
Admission: RE | Admit: 2012-05-10 | Discharge: 2012-05-17 | DRG: 329 | Disposition: A | Discharge status: Home / Self Care | Payer: 59 | Source: Ambulatory Visit | Attending: General Surgery | Admitting: General Surgery

## 2012-05-10 DIAGNOSIS — R05 Cough: Secondary | ICD-10-CM | POA: Diagnosis not present

## 2012-05-10 DIAGNOSIS — I10 Essential (primary) hypertension: Secondary | ICD-10-CM | POA: Insufficient documentation

## 2012-05-10 DIAGNOSIS — D126 Benign neoplasm of colon, unspecified: Secondary | ICD-10-CM

## 2012-05-10 DIAGNOSIS — J189 Pneumonia, unspecified organism: Secondary | ICD-10-CM | POA: Diagnosis not present

## 2012-05-10 DIAGNOSIS — R059 Cough, unspecified: Secondary | ICD-10-CM | POA: Diagnosis not present

## 2012-05-10 HISTORY — PX: HEMICOLECTOMY: SHX854

## 2012-05-10 LAB — TYPE AND SCREEN
ABO/RH(D): O POS
Antibody Screen: NEGATIVE

## 2012-05-10 LAB — CREATININE, SERUM
Creatinine, Ser: 0.93 mg/dL (ref 0.50–1.35)
GFR calc Af Amer: >90 mL/min (ref >90)
GFR calc non Af Amer: >90 mL/min (ref >90)

## 2012-05-10 LAB — CBC
HCT: 37 % — ABNORMAL LOW (ref 39.0–52.0)
Hemoglobin: 12.6 g/dL — ABNORMAL LOW (ref 13.0–17.0)
RBC: 4.12 MIL/uL — ABNORMAL LOW (ref 4.22–5.81)
WBC: 12 10*3/uL — ABNORMAL HIGH (ref 4.0–10.5)

## 2012-05-10 SURGERY — EXCISION, CECUM WITH ILEUM, LAPAROSCOPIC
Anesthesia: General | Site: Abdomen | Wound class: Contaminated

## 2012-05-10 MED ORDER — MORPHINE SULFATE (PF) 1 MG/ML IV SOLN
INTRAVENOUS | Status: DC
  Administered 2012-05-10: 1.5 mg via INTRAVENOUS
  Administered 2012-05-10: 17:00:00 via INTRAVENOUS
  Administered 2012-05-10: 1.5 mg via INTRAVENOUS
  Administered 2012-05-11: 3 mg via INTRAVENOUS
  Administered 2012-05-11: 06:00:00 via INTRAVENOUS
  Administered 2012-05-11: 4.5 mg via INTRAVENOUS
  Administered 2012-05-11: 6 mg via INTRAVENOUS
  Administered 2012-05-11: 1.5 mg via INTRAVENOUS
  Administered 2012-05-11: 6 mg via INTRAVENOUS
  Administered 2012-05-12: 11:00:00 via INTRAVENOUS
  Administered 2012-05-12: 3 mL via INTRAVENOUS
  Administered 2012-05-12: 7.5 mL via INTRAVENOUS
  Administered 2012-05-12: 1.5 mg via INTRAVENOUS
  Administered 2012-05-12: 3 mg via INTRAVENOUS
  Administered 2012-05-12: 0.903 mg via INTRAVENOUS
  Administered 2012-05-13: 1.5 mg via INTRAVENOUS
  Administered 2012-05-13: 3 mg via INTRAVENOUS
  Administered 2012-05-13: 1.5 mg via INTRAVENOUS
  Administered 2012-05-14: 3 mg via INTRAVENOUS
  Filled 2012-05-10 (×2): qty 25

## 2012-05-10 MED ORDER — ALVIMOPAN 12 MG PO CAPS
ORAL_CAPSULE | ORAL | Status: AC
  Administered 2012-05-10: 12 mg via ORAL
  Filled 2012-05-10: qty 1

## 2012-05-10 MED ORDER — KCL IN DEXTROSE-NACL 20-5-0.45 MEQ/L-%-% IV SOLN
INTRAVENOUS | Status: AC
  Filled 2012-05-10: qty 1000

## 2012-05-10 MED ORDER — SODIUM CHLORIDE 0.9 % IV SOLN
INTRAVENOUS | Status: AC
  Filled 2012-05-10: qty 1

## 2012-05-10 MED ORDER — SODIUM CHLORIDE 0.9 % IJ SOLN: 9.0000 mL | INTRAMUSCULAR | Status: DC | PRN

## 2012-05-10 MED ORDER — LIDOCAINE HCL (CARDIAC) 20 MG/ML IV SOLN
INTRAVENOUS | Status: DC | PRN
  Administered 2012-05-10: 80 mg via INTRAVENOUS

## 2012-05-10 MED ORDER — ALVIMOPAN 12 MG PO CAPS
12.0000 mg | ORAL_CAPSULE | Freq: Two times a day (BID) | ORAL | Status: DC
  Administered 2012-05-11 – 2012-05-14 (×7): 12 mg via ORAL
  Filled 2012-05-10 (×8): qty 1

## 2012-05-10 MED ORDER — KETAMINE HCL 50 MG/ML IJ SOLN
INTRAMUSCULAR | Status: DC | PRN
  Administered 2012-05-10: 25 mg via INTRAMUSCULAR

## 2012-05-10 MED ORDER — ONDANSETRON HCL 4 MG/2ML IJ SOLN
4.0000 mg | Freq: Four times a day (QID) | INTRAMUSCULAR | Status: DC | PRN
  Administered 2012-05-10 – 2012-05-12 (×2): 4 mg via INTRAVENOUS
  Filled 2012-05-10 (×2): qty 2

## 2012-05-10 MED ORDER — HYDROMORPHONE HCL PF 1 MG/ML IJ SOLN
INTRAMUSCULAR | Status: DC | PRN
  Administered 2012-05-10 (×2): 0.5 mg via INTRAVENOUS
  Administered 2012-05-10: 1 mg via INTRAVENOUS

## 2012-05-10 MED ORDER — HYDROMORPHONE HCL PF 1 MG/ML IJ SOLN
0.2500 mg | INTRAMUSCULAR | Status: DC | PRN
  Administered 2012-05-10: 0.25 mg via INTRAVENOUS
  Administered 2012-05-10: 0.5 mg via INTRAVENOUS
  Administered 2012-05-10: 0.25 mg via INTRAVENOUS

## 2012-05-10 MED ORDER — SODIUM CHLORIDE 0.9 % IV SOLN
1.0000 g | INTRAVENOUS | Status: AC
  Administered 2012-05-10: 1 g via INTRAVENOUS

## 2012-05-10 MED ORDER — ENOXAPARIN SODIUM 40 MG/0.4ML ~~LOC~~ SOLN
40.0000 mg | SUBCUTANEOUS | Status: DC
  Administered 2012-05-11 – 2012-05-16 (×6): 40 mg via SUBCUTANEOUS
  Filled 2012-05-10 (×7): qty 0.4

## 2012-05-10 MED ORDER — ONDANSETRON HCL 4 MG PO TABS: 4.0000 mg | ORAL_TABLET | Freq: Four times a day (QID) | ORAL | Status: DC | PRN

## 2012-05-10 MED ORDER — ONDANSETRON HCL 4 MG/2ML IJ SOLN: 4.0000 mg | Freq: Four times a day (QID) | INTRAMUSCULAR | Status: DC | PRN

## 2012-05-10 MED ORDER — LACTATED RINGERS IV SOLN: INTRAVENOUS | Status: DC

## 2012-05-10 MED ORDER — NALOXONE HCL 0.4 MG/ML IJ SOLN: 0.4000 mg | INTRAMUSCULAR | Status: DC | PRN

## 2012-05-10 MED ORDER — LACTATED RINGERS IR SOLN
Status: DC | PRN
  Administered 2012-05-10: 3000 mL

## 2012-05-10 MED ORDER — ROCURONIUM BROMIDE 100 MG/10ML IV SOLN
INTRAVENOUS | Status: DC | PRN
  Administered 2012-05-10: 40 mg via INTRAVENOUS
  Administered 2012-05-10 (×2): 10 mg via INTRAVENOUS
  Administered 2012-05-10: 20 mg via INTRAVENOUS
  Administered 2012-05-10: 10 mg via INTRAVENOUS

## 2012-05-10 MED ORDER — BUPIVACAINE-EPINEPHRINE PF 0.25-1:200000 % IJ SOLN
INTRAMUSCULAR | Status: AC
  Filled 2012-05-10: qty 30

## 2012-05-10 MED ORDER — OXYCODONE-ACETAMINOPHEN 5-325 MG PO TABS: 1.0000 | ORAL_TABLET | ORAL | Status: AC | PRN

## 2012-05-10 MED ORDER — KCL IN DEXTROSE-NACL 20-5-0.45 MEQ/L-%-% IV SOLN
INTRAVENOUS | Status: DC
  Administered 2012-05-10 – 2012-05-11 (×2): via INTRAVENOUS
  Administered 2012-05-11: 100 mL via INTRAVENOUS
  Administered 2012-05-12 – 2012-05-16 (×6): via INTRAVENOUS
  Filled 2012-05-10 (×14): qty 1000

## 2012-05-10 MED ORDER — ONDANSETRON HCL 4 MG/2ML IJ SOLN
INTRAMUSCULAR | Status: DC | PRN
  Administered 2012-05-10: 4 mg via INTRAVENOUS

## 2012-05-10 MED ORDER — DIPHENHYDRAMINE HCL 12.5 MG/5ML PO ELIX: 12.5000 mg | ORAL_SOLUTION | Freq: Four times a day (QID) | ORAL | Status: DC | PRN

## 2012-05-10 MED ORDER — BUPIVACAINE ON-Q PAIN PUMP (FOR ORDER SET NO CHG)
INJECTION | Status: DC
  Filled 2012-05-10: qty 1

## 2012-05-10 MED ORDER — GLYCOPYRROLATE 0.2 MG/ML IJ SOLN
INTRAMUSCULAR | Status: DC | PRN
  Administered 2012-05-10: 0.6 mg via INTRAVENOUS

## 2012-05-10 MED ORDER — BUPIVACAINE-EPINEPHRINE 0.25% -1:200000 IJ SOLN
INTRAMUSCULAR | Status: DC | PRN
  Administered 2012-05-10: 10 mL

## 2012-05-10 MED ORDER — ACETAMINOPHEN 10 MG/ML IV SOLN
INTRAVENOUS | Status: AC
  Filled 2012-05-10: qty 100

## 2012-05-10 MED ORDER — DEXAMETHASONE SODIUM PHOSPHATE 10 MG/ML IJ SOLN
INTRAMUSCULAR | Status: DC | PRN
  Administered 2012-05-10: 10 mg via INTRAVENOUS

## 2012-05-10 MED ORDER — DIPHENHYDRAMINE HCL 50 MG/ML IJ SOLN: 12.5000 mg | Freq: Four times a day (QID) | INTRAMUSCULAR | Status: DC | PRN

## 2012-05-10 MED ORDER — FENTANYL CITRATE 0.05 MG/ML IJ SOLN
INTRAMUSCULAR | Status: DC | PRN
  Administered 2012-05-10 (×5): 50 ug via INTRAVENOUS

## 2012-05-10 MED ORDER — MIDAZOLAM HCL 5 MG/5ML IJ SOLN
INTRAMUSCULAR | Status: DC | PRN
  Administered 2012-05-10: 2 mg via INTRAVENOUS

## 2012-05-10 MED ORDER — LIDOCAINE HCL (PF) 1 % IJ SOLN
INTRAMUSCULAR | Status: DC | PRN
  Administered 2012-05-10: 10 mL

## 2012-05-10 MED ORDER — MORPHINE SULFATE (PF) 1 MG/ML IV SOLN
INTRAVENOUS | Status: AC
  Filled 2012-05-10: qty 25

## 2012-05-10 MED ORDER — LACTATED RINGERS IV SOLN
INTRAVENOUS | Status: DC | PRN
  Administered 2012-05-10 (×2): via INTRAVENOUS

## 2012-05-10 MED ORDER — SODIUM CHLORIDE 0.9 % IV SOLN
1.0000 g | INTRAVENOUS | Status: AC
  Administered 2012-05-11: 1 g via INTRAVENOUS
  Filled 2012-05-10: qty 1

## 2012-05-10 MED ORDER — ALVIMOPAN 12 MG PO CAPS
12.0000 mg | ORAL_CAPSULE | Freq: Once | ORAL | Status: AC
  Administered 2012-05-10: 12 mg via ORAL

## 2012-05-10 MED ORDER — ACETAMINOPHEN 10 MG/ML IV SOLN
1000.0000 mg | Freq: Four times a day (QID) | INTRAVENOUS | Status: AC
  Administered 2012-05-10 – 2012-05-11 (×4): 1000 mg via INTRAVENOUS
  Filled 2012-05-10 (×5): qty 100

## 2012-05-10 MED ORDER — HYDROMORPHONE HCL PF 1 MG/ML IJ SOLN
INTRAMUSCULAR | Status: AC
  Filled 2012-05-10: qty 1

## 2012-05-10 MED ORDER — HETASTARCH-ELECTROLYTES 6 % IV SOLN
INTRAVENOUS | Status: DC | PRN
  Administered 2012-05-10: 15:00:00 via INTRAVENOUS

## 2012-05-10 MED ORDER — 0.9 % SODIUM CHLORIDE (POUR BTL) OPTIME
TOPICAL | Status: DC | PRN
  Administered 2012-05-10: 2000 mL

## 2012-05-10 MED ORDER — PROPOFOL 10 MG/ML IV EMUL
INTRAVENOUS | Status: DC | PRN
  Administered 2012-05-10: 250 mg via INTRAVENOUS

## 2012-05-10 MED ORDER — MORPHINE SULFATE 2 MG/ML IJ SOLN
1.0000 mg | INTRAMUSCULAR | Status: DC | PRN
  Administered 2012-05-16: 2 mg via INTRAVENOUS
  Filled 2012-05-10: qty 1

## 2012-05-10 MED ORDER — LIDOCAINE HCL 1 % IJ SOLN
INTRAMUSCULAR | Status: AC
  Filled 2012-05-10: qty 20

## 2012-05-10 MED ORDER — ACETAMINOPHEN 10 MG/ML IV SOLN
INTRAVENOUS | Status: DC | PRN
  Administered 2012-05-10: 1000 mg via INTRAVENOUS

## 2012-05-10 MED ORDER — NEOSTIGMINE METHYLSULFATE 1 MG/ML IJ SOLN
INTRAMUSCULAR | Status: DC | PRN
  Administered 2012-05-10: 5 mg via INTRAVENOUS

## 2012-05-10 MED ORDER — BUPIVACAINE 0.25 % ON-Q PUMP DUAL CATH 300 ML
300.0000 mL | INJECTION | Status: DC
  Administered 2012-05-10: 300 mL
  Filled 2012-05-10: qty 300

## 2012-05-10 SURGICAL SUPPLY — 88 items
ADH SKN CLS APL DERMABOND .7 (GAUZE/BANDAGES/DRESSINGS) ×1
APPLIER CLIP 5 13 M/L LIGAMAX5 (MISCELLANEOUS)
APPLIER CLIP ROT 10 11.4 M/L (STAPLE)
APR CLP MED LRG 11.4X10 (STAPLE)
APR CLP MED LRG 5 ANG JAW (MISCELLANEOUS)
BLADE EXTENDED COATED 6.5IN (ELECTRODE) ×2 IMPLANT
BLADE HEX COATED 2.75 (ELECTRODE) ×2 IMPLANT
BLADE SURG SZ10 CARB STEEL (BLADE) ×2 IMPLANT
CABLE HIGH FREQUENCY MONO STRZ (ELECTRODE) ×1 IMPLANT
CANNULA ENDOPATH XCEL 11M (ENDOMECHANICALS) IMPLANT
CATH KIT ON Q 5IN SLV (PAIN MANAGEMENT) ×2 IMPLANT
CELLS DAT CNTRL 66122 CELL SVR (MISCELLANEOUS) IMPLANT
CHLORAPREP W/TINT 26ML (MISCELLANEOUS) ×2 IMPLANT
CLIP APPLIE 5 13 M/L LIGAMAX5 (MISCELLANEOUS) IMPLANT
CLIP APPLIE ROT 10 11.4 M/L (STAPLE) IMPLANT
CLOTH BEACON ORANGE TIMEOUT ST (SAFETY) ×2 IMPLANT
COVER MAYO STAND STRL (DRAPES) ×2 IMPLANT
DECANTER SPIKE VIAL GLASS SM (MISCELLANEOUS) ×2 IMPLANT
DERMABOND ADVANCED (GAUZE/BANDAGES/DRESSINGS) ×1
DERMABOND ADVANCED .7 DNX12 (GAUZE/BANDAGES/DRESSINGS) IMPLANT
DISSECTOR BLUNT TIP ENDO 5MM (MISCELLANEOUS) IMPLANT
DRAPE LAPAROSCOPIC ABDOMINAL (DRAPES) ×1 IMPLANT
DRAPE LG THREE QUARTER DISP (DRAPES) ×1 IMPLANT
DRAPE UTILITY XL STRL (DRAPES) ×7 IMPLANT
DRAPE WARM FLUID 44X44 (DRAPE) ×2 IMPLANT
DRSG TEGADERM 2-3/8X2-3/4 SM (GAUZE/BANDAGES/DRESSINGS) ×3 IMPLANT
ELECT REM PT RETURN 9FT ADLT (ELECTROSURGICAL) ×2
ELECTRODE REM PT RTRN 9FT ADLT (ELECTROSURGICAL) ×1 IMPLANT
FILTER SMOKE EVAC LAPAROSHD (FILTER) IMPLANT
GAUZE SPONGE 2X2 8PLY STRL LF (GAUZE/BANDAGES/DRESSINGS) IMPLANT
GLOVE BIO SURGEON STRL SZ 6 (GLOVE) ×6 IMPLANT
GLOVE BIOGEL PI IND STRL 6.5 (GLOVE) ×1 IMPLANT
GLOVE BIOGEL PI IND STRL 7.0 (GLOVE) ×1 IMPLANT
GLOVE BIOGEL PI INDICATOR 6.5 (GLOVE) ×1
GLOVE BIOGEL PI INDICATOR 7.0 (GLOVE) ×1
GLOVE INDICATOR 6.5 STRL GRN (GLOVE) ×4 IMPLANT
GOWN PREVENTION PLUS XLARGE (GOWN DISPOSABLE) ×2 IMPLANT
GOWN PREVENTION PLUS XXLARGE (GOWN DISPOSABLE) ×2 IMPLANT
GOWN STRL NON-REIN LRG LVL3 (GOWN DISPOSABLE) ×2 IMPLANT
KIT BASIN OR (CUSTOM PROCEDURE TRAY) ×2 IMPLANT
LIGASURE IMPACT 36 18CM CVD LR (INSTRUMENTS) IMPLANT
NS IRRIG 1000ML POUR BTL (IV SOLUTION) ×5 IMPLANT
PENCIL BUTTON HOLSTER BLD 10FT (ELECTRODE) ×2 IMPLANT
RELOAD PROXIMATE 75MM BLUE (ENDOMECHANICALS) ×2 IMPLANT
RELOAD STAPLE 75 3.8 BLU REG (ENDOMECHANICALS) IMPLANT
RETRACTOR WND ALEXIS 18 MED (MISCELLANEOUS) IMPLANT
RTRCTR WOUND ALEXIS 18CM MED (MISCELLANEOUS)
SCALPEL HARMONIC ACE (MISCELLANEOUS) IMPLANT
SCISSORS LAP 5X35 DISP (ENDOMECHANICALS) ×1 IMPLANT
SEALER TISSUE G2 CVD JAW 35 (ENDOMECHANICALS) IMPLANT
SEALER TISSUE G2 CVD JAW 45CM (ENDOMECHANICALS) ×1
SET IRRIG TUBING LAPAROSCOPIC (IRRIGATION / IRRIGATOR) ×1 IMPLANT
SOLUTION ANTI FOG 6CC (MISCELLANEOUS) ×2 IMPLANT
SPONGE GAUZE 2X2 STER 10/PKG (GAUZE/BANDAGES/DRESSINGS) ×1
SPONGE GAUZE 4X4 12PLY (GAUZE/BANDAGES/DRESSINGS) ×2 IMPLANT
SPONGE LAP 18X18 X RAY DECT (DISPOSABLE) ×1 IMPLANT
STAPLER GUN LINEAR PROX 60 (STAPLE) ×1 IMPLANT
STAPLER PROXIMATE 75MM BLUE (STAPLE) ×2 IMPLANT
STAPLER VISISTAT 35W (STAPLE) ×2 IMPLANT
SUCTION POOLE TIP (SUCTIONS) ×2 IMPLANT
SUT PDS AB 0 CTX 60 (SUTURE) ×2 IMPLANT
SUT PDS AB 1 CTX 36 (SUTURE) IMPLANT
SUT PROLENE 2 0 SH DA (SUTURE) IMPLANT
SUT VIC AB 2-0 CT1 27 (SUTURE)
SUT VIC AB 2-0 CT1 TAPERPNT 27 (SUTURE) IMPLANT
SUT VIC AB 2-0 CTX 36 (SUTURE) IMPLANT
SUT VIC AB 2-0 SH 18 (SUTURE) ×2 IMPLANT
SUT VIC AB 3-0 SH 18 (SUTURE) IMPLANT
SUT VIC AB 3-0 SH 27 (SUTURE) ×2
SUT VIC AB 3-0 SH 27XBRD (SUTURE) IMPLANT
SUT VICRYL 2 0 18  UND BR (SUTURE) ×1
SUT VICRYL 2 0 18 UND BR (SUTURE) ×1 IMPLANT
SUT VICRYL 3 0 BR 18  UND (SUTURE)
SUT VICRYL 3 0 BR 18 UND (SUTURE) IMPLANT
SYR BULB IRRIGATION 50ML (SYRINGE) ×2 IMPLANT
SYS LAPSCP GELPORT 120MM (MISCELLANEOUS) ×2
SYSTEM LAPSCP GELPORT 120MM (MISCELLANEOUS) IMPLANT
TOWEL OR 17X26 10 PK STRL BLUE (TOWEL DISPOSABLE) ×2 IMPLANT
TOWEL OR NON WOVEN STRL DISP B (DISPOSABLE) ×1 IMPLANT
TRAY FOLEY CATH 14FRSI W/METER (CATHETERS) ×2 IMPLANT
TRAY LAP CHOLE (CUSTOM PROCEDURE TRAY) ×2 IMPLANT
TROCAR BLADELESS OPT 5 75 (ENDOMECHANICALS) ×4 IMPLANT
TROCAR XCEL BLUNT TIP 100MML (ENDOMECHANICALS) IMPLANT
TROCAR XCEL NON-BLD 11X100MML (ENDOMECHANICALS) ×1 IMPLANT
TUBING FILTER THERMOFLATOR (ELECTROSURGICAL) ×2 IMPLANT
TUNNELER SHEATH ON-Q 16GX12 DP (PAIN MANAGEMENT) ×1 IMPLANT
YANKAUER SUCT BULB TIP 10FT TU (MISCELLANEOUS) ×2 IMPLANT
YANKAUER SUCT BULB TIP NO VENT (SUCTIONS) ×2 IMPLANT

## 2012-05-10 NOTE — Interval H&P Note (Signed)
History and Physical Interval Note:  05/10/2012 12:22 PM  Jesse Arroyo  has presented today for surgery, with the diagnosis of tubulovillous adenoma at ileocecal valve  The various methods of treatment have been discussed with the patient and family. After consideration of risks, benefits and other options for treatment, the patient has consented to  Procedure(s) (LRB): LAPAROSCOPIC ILEOCECECTOMY (N/A) as a surgical intervention .  The patients' history has been reviewed, patient examined, no change in status, stable for surgery.  I have reviewed the patients' chart and labs.  Questions were answered to the patient's satisfaction.     [name]

## 2012-05-10 NOTE — Anesthesia Preprocedure Evaluation (Addendum)
Anesthesia Evaluation  Patient identified by MRN, date of birth, ID band Patient awake    Reviewed: Allergy & Precautions, H&P , NPO status , Patient's Chart, lab work & pertinent test results, reviewed documented beta blocker date and time   History of Anesthesia Complications (+) DIFFICULT AIRWAY  Airway Mallampati: II TM Distance: >3 FB Neck ROM: Full    Dental  (+) Teeth Intact and Dental Advisory Given   Pulmonary neg pulmonary ROS,  breath sounds clear to auscultation        Cardiovascular hypertension, Pt. on medications Rhythm:Regular Rate:Normal  Denies cardiac symptoms   Neuro/Psych negative neurological ROS  negative psych ROS   GI/Hepatic Neg liver ROS, Villous adenoma   Endo/Other  negative endocrine ROS  Renal/GU negative Renal ROS  negative genitourinary   Musculoskeletal negative musculoskeletal ROS (+)   Abdominal   Peds negative pediatric ROS (+)  Hematology negative hematology ROS (+)   Anesthesia Other Findings Upper front veneer  Reproductive/Obstetrics negative OB ROS                        Anesthesia Physical Anesthesia Plan  ASA: II  Anesthesia Plan: General   Post-op Pain Management:    Induction: Intravenous  Airway Management Planned: Oral ETT  Additional Equipment:   Intra-op Plan:   Post-operative Plan: Extubation in OR  Informed Consent: I have reviewed the patients History and Physical, chart, labs and discussed the procedure including the risks, benefits and alternatives for the proposed anesthesia with the patient or authorized representative who has indicated his/her understanding and acceptance.   Dental advisory given  Plan Discussed with: CRNA and Surgeon  Anesthesia Plan Comments:         Anesthesia Quick Evaluation

## 2012-05-10 NOTE — Transfer of Care (Signed)
Immediate Anesthesia Transfer of Care Note  Patient: Jesse Arroyo  Procedure(s) Performed: Procedure(s) (LRB): LAPAROSCOPIC ILEOCECECTOMY (N/A)  Patient Location: PACU  Anesthesia Type: General  Level of Consciousness: awake, patient cooperative and responds to stimulation  Airway & Oxygen Therapy: Patient Spontanous Breathing and Patient connected to face mask  Post-op Assessment: Report given to PACU RN, Post -op Vital signs reviewed and stable and Patient moving all extremities  Post vital signs: Reviewed and stable  Complications: No apparent anesthesia complications

## 2012-05-10 NOTE — Op Note (Signed)
Laparoscopic Partial Colectomy (R hemicolectomy) Procedure Note   Indications: This patient presents for a laparoscopic right colectomy for tubulovillous adenoma of IC valve  Pre-operative Diagnosis: tubulovillous adenoma   Post-operative Diagnosis: Same  Surgeon: Almond Lint   Anesthesia: General endotracheal anesthesia   ASA Class: 2   Procedure Details  The patient was seen in the Holding Room. The risks, benefits, complications, treatment options, and expected outcomes were discussed with the patient. The possibilities of reaction to medication, perforation of viscus, bleeding, recurrent infection, finding a normal colon, the need for additional procedures, failure to diagnose a condition, and creating a complication requiring transfusion or operation were discussed with the patient. The patient was advised of the risk of ostomy. The patient concurred with the proposed plan, giving informed consent. The patient was taken to the operating room, identified, and the procedure verified as partial colectomy. A Time Out was held and the above information confirmed.   The patient was brought to the operating room and placed supine. After induction of a general anesthetic, a Foley catheter was inserted and the abdomen was prepped and draped in standard fashion. Local anesthetic was infiltrated at the left costal margin. A 5 mm Optiview trocar was placed into the abdomen under direct visualization. Pneumoperitoneum was insufflated to a pressure of 15 mm Hg. The laparoscope was introduced.   Exploration revealed a normal omentum, colon, small bowel, peritoneum, liver, and stomach. Two additional left lateral and one right lateral 5-mm trocars were then placed after anesthetizing the skin and peritoneum with Marcaine. The ascending colon and hepatic flexure were then mobilized with gentle retraction of the colon in a medial direction with mobilization of the peritoneal reflection with cautery and  the Enseal. Mobilization of this area was complete to expose the retroperitoneum. The omentum was quite friable and oozed during dissection off the colon.  It also was adherent to the ascending colon. The duodenum was avoided. The omentum was taken off the middle and proximal transverse colon with the Enseal.   After completing mobilization, a 6 cm incision was made in the midline just above the umbilicus.  A Gelport was placed to protect the skin, and the colon was delivered through the incision. The transverse colon would not come out into the wound, so pneumoperitoneum was reachieved, and additional omentum and retroperitoneal attachments were taken down.  The terminal ileum and colon was resected with a linear stapling device proximal and distal to the area in question in regard to the specimen. The mesenteric vessels were divided with the EnSeal. The specimen was submitted to pathology.   An end-to-end anastomosis was performed through the small anterior incision with the linear stapling device. The mucosa was inspected and found to be hemostatic. Closure was achieved with the transverse stapling device. A 3-0 Vicryl suture was used to reapproximate the angle of the anastomosis. Hemostasis was confirmed. The mesenteric defect was closed with 2-0 running Vicryl suture. The bowel anastomosis was returned. The OnQ tunnelers were advanced through the skin on each side of the incision. The 5 inch catheters were advanced through the tunnelers.   The fascial incision was then closed with a running looped 0-PDS suture. The soft tissue was irrigated and the incisions were closed with staples.  Soft dressings were applied.    Instrument, sponge, and needle counts were correct prior to abdominal closure and at the conclusion of the case.   Findings: Right colon without large mass palpable externally.     Estimated Blood  Loss: 100 mL  Specimens: R colon/terminal ileum  Complications: None; patient tolerated  the procedure well.   Disposition: PACU - hemodynamically stable.   Condition: stable

## 2012-05-10 NOTE — H&P (View-Only) (Signed)
Chief Complaint  Patient presents with  . New Evaluation    colon mass consult for surgery    HISTORY: Patient is a 51 year old male who was referred to Dr. Arlyce Dice for colonoscopy. He has a history of loose stools and sometimes pencil thin stools. 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 Biopsies were negative for cancer. He also has had pain in the right lower quadrant recently. He describes it as crampy. He denies nausea or vomiting. He denies constipation. He has no family history of cancer, but he does have a family history of similar types of GI issues.   Past Medical History  Diagnosis Date  . Allergy   . Hypertension   . Skin abnormalities     feet    Past Surgical History  Procedure Date  . Wisdom tooth extraction     Current Outpatient Prescriptions  Medication Sig Dispense Refill  . aspirin 81 MG tablet Take 81 mg by mouth daily.      . fluocinonide (LIDEX) 0.05 % cream Apply 1 application topically 2 (two) times daily.        Marland Kitchen lisinopril-hydrochlorothiazide (PRINZIDE,ZESTORETIC) 10-12.5 MG per tablet TAKE 1 TABLET EVERY MORNING  100 tablet  2  . Multiple Vitamin (MULTIVITAMIN) tablet Take 1 tablet by mouth daily.      . sildenafil (VIAGRA) 50 MG tablet Take 50 mg by mouth daily as needed.        . triamcinolone cream (KENALOG) 0.5 % Apply 1 application topically 3 (three) times daily. Apply nightly  60 g  4     No Known Allergies   Family History  Problem Relation Age of Onset  . Hypertension Other   . Lung cancer Father 92  . Colon cancer Neg Hx      History   Social History  . Marital Status: Married    Spouse Name: N/A    Number of Children: 2  . 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 Smoking status: Former Smoker    Quit date: 12/27/1993  . Smokeless tobacco: Former Neurosurgeon    Types: Snuff    Quit date: 12/28/2007  . Alcohol Use: 2.4 oz/week    4  Glasses of wine per week  . Drug Use: No  . Sexually Active: Not on file   Other Topics Concern  . Not on file   Social History Narrative  . No narrative on file     REVIEW OF SYSTEMS - PERTINENT POSITIVES ONLY: 12 point review of systems negative other than HPI and PMH  EXAM: Filed Vitals:   04/14/12 0839  BP: 124/78  Pulse: 75  Temp: 97 F (36.1 C)  Resp: 14    Gen:  No acute distress.  Well nourished and well groomed.   Neurological: Alert and oriented to person, place, and time. Coordination normal.  Head: Normocephalic and atraumatic.  Eyes: Conjunctivae are normal. Pupils are equal, round, and reactive to light. No scleral icterus.  Neck: Normal range of motion. Neck supple. No tracheal deviation or thyromegaly present.  Cardiovascular: Normal rate, regular rhythm, normal heart sounds and intact distal pulses.  Exam reveals no gallop and no friction rub.  No murmur heard. Respiratory: Effort normal.  No respiratory distress. No chest wall tenderness. Breath sounds normal.  No wheezes, rales or rhonchi.  GI: Soft. Bowel sounds are normal. The abdomen is soft and  nontender.  There is no rebound and no guarding.  Musculoskeletal: Normal range of motion. Extremities are nontender.  Lymphadenopathy: No cervical, preauricular, postauricular or axillary adenopathy is present Skin: Skin is warm and dry. No rash noted. No diaphoresis. No erythema. No pallor. No clubbing, cyanosis, or edema.   Psychiatric: Normal mood and affect. Behavior is normal. Judgment and thought content normal.    LABORATORY RESULTS: None available.     RADIOLOGY RESULTS: None    ASSESSMENT AND PLAN: Tubulovillous adenoma of ileocecal valve Pt with flat adenoma at ileocecal valve. Biopsies are negative for cancer.  Plan ileocecectomy.    Complications such as the ones below were discussed with the patient and include: Bleeding Infection and possible wound complications such as  hernia Damage to adjacent structures Leak of surgical connections, which can lead to other surgeries and possibly an ostomy. (5-10%) Possible need for other procedures, such as abscess drains in radiology  Possible prolonged hospital stay Possible diarrhea from removal of part of the colon. Possible constipation from narcotics.    Prolonged fatigue/weakness/appetite MOST PATIENTS' ENERGY LEVEL IS NOT BACK TO NORMAL FOR AT LEAST 2-4 MONTHS.  OLDER PATIENTS MAY FEEL WEAK FOR LONGER PERIODS OF TIME.   Difficulty with eating or post operative nausea  Possible cancer in the polyp Possible complications of your medical problems such as heart disease or arrhythmias. Death (less than 1%)  Educational material was given to the patient.  He will do a one day miralax bowel prep.       Maudry Diego MD Surgical Oncology, General and Endocrine Surgery [facility]      Visit Diagnoses: 1. Tubulovillous adenoma of ileocecal valve     Primary Care Physician: Evette Georges, MD, MD

## 2012-05-10 NOTE — Anesthesia Postprocedure Evaluation (Signed)
  Anesthesia Post-op Note  Patient: Jesse Arroyo  Procedure(s) Performed: Procedure(s) (LRB): LAPAROSCOPIC ILEOCECECTOMY (N/A)  Patient Location: PACU  Anesthesia Type: General  Level of Consciousness: oriented and sedated  Airway and Oxygen Therapy: Patient Spontanous Breathing and Patient connected to nasal cannula oxygen  Post-op Pain: mild  Post-op Assessment: Post-op Vital signs reviewed, Patient's Cardiovascular Status Stable, Respiratory Function Stable and Patent Airway  Post-op Vital Signs: stable  Complications: No apparent anesthesia complications

## 2012-05-10 NOTE — Discharge Instructions (Addendum)
[facility]      [facility], [place] [phone]  ABDOMINAL SURGERY: POST OP INSTRUCTIONS  Always review your discharge instruction sheet given to you by the facility where your surgery was performed.  IF YOU HAVE DISABILITY OR FAMILY LEAVE FORMS, YOU MUST BRING THEM TO THE OFFICE FOR PROCESSING.  PLEASE DO NOT GIVE THEM TO YOUR DOCTOR.  1. A prescription for pain medication may be given to you upon discharge.  Take your pain medication as prescribed, if needed.  If narcotic pain medicine is not needed, then you may take acetaminophen (Tylenol) or ibuprofen (Advil) as needed. 2. Take your usually prescribed medications unless otherwise directed. 3. If you need a refill on your pain medication, please contact your pharmacy. They will contact our office to request authorization.  Prescriptions will not be filled after 5pm or on week-ends. 4. You should follow a low fat diet.   Be sure to include lots of fluids daily. 5. Most patients will experience some swelling and bruising in the area of the incision. Ice pack will help. Swelling and bruising can take several days to resolve..  6. It is common to experience some constipation if taking pain medication after surgery.  Increasing fluid intake and taking a stool softener will usually help or prevent this problem from occurring.  A mild laxative (Milk of Magnesia or Miralax) should be taken according to package directions if there are no bowel movements after 48 hours. 7.  You may have steri-strips (small skin tapes) in place directly over the incision.  These strips should be left on the skin.  If your surgeon used skin glue on the incision, you may shower in 48 hours.  The glue will flake off over the next 2-3 weeks.  Any sutures or staples will be removed at the office during your follow-up visit. You may find that a light gauze bandage over your incision may keep your staples from being rubbed or pulled. You may shower and replace the bandage  daily. 8. ACTIVITIES:  You may resume regular (light) daily activities beginning the next day--such as daily self-care, walking, climbing stairs--gradually increasing activities as tolerated.  You may have sexual intercourse when it is comfortable.  Refrain from any heavy lifting or straining until approved by your doctor. a. You may drive when you no longer are taking prescription pain medication, you can comfortably wear a seatbelt, and you can safely maneuver your car and apply brakes b. Return to Work: __________4-8 weeks depending on work_______________________ 9. You should see your doctor in the office for a follow-up appointment approximately 2-3 weeks after your surgery.  Make sure that you call for this appointment within a day or two after you arrive home to insure a convenient appointment time. OTHER INSTRUCTIONS:  _____________________________________________________________ _____________________________________________________________  WHEN TO CALL YOUR DOCTOR: 1. Fever over 101.5 2. Inability to urinate 3. Nausea and/or vomiting 4. Extreme swelling or bruising 5. Continued bleeding from incision. 6. Increased pain, redness, or drainage from the incision.  The clinic staff is available to answer your questions during regular business hours.  Please don't hesitate to call and ask to speak to one of the nurses if you have concerns.  For further questions, please visit [email or web address]

## 2012-05-10 NOTE — Preoperative (Signed)
Beta Blockers   Reason not to administer Beta Blockers:Not Applicable 

## 2012-05-11 DIAGNOSIS — I10 Essential (primary) hypertension: Secondary | ICD-10-CM

## 2012-05-11 LAB — CBC
MCV: 90.1 fL (ref 78.0–100.0)
Platelets: 218 10*3/uL (ref 150–400)
RBC: 4.06 MIL/uL — ABNORMAL LOW (ref 4.22–5.81)
RDW: 13.2 % (ref 11.5–15.5)
WBC: 12.8 10*3/uL — ABNORMAL HIGH (ref 4.0–10.5)

## 2012-05-11 LAB — BASIC METABOLIC PANEL
BUN: 15 mg/dL (ref 6–23)
Chloride: 103 mEq/L (ref 96–112)
GFR calc Af Amer: >90 mL/min (ref >90)
GFR calc non Af Amer: >90 mL/min (ref >90)
Potassium: 4.3 mEq/L (ref 3.5–5.1)
Sodium: 135 mEq/L (ref 135–145)

## 2012-05-11 LAB — MAGNESIUM: Magnesium: 1.9 mg/dL (ref 1.5–2.5)

## 2012-05-11 MED ORDER — LISINOPRIL-HYDROCHLOROTHIAZIDE 10-12.5 MG PO TABS: 1.0000 | ORAL_TABLET | Freq: Every day | ORAL | Status: DC

## 2012-05-11 MED ORDER — LISINOPRIL 10 MG PO TABS
10.0000 mg | ORAL_TABLET | Freq: Every day | ORAL | Status: DC
  Administered 2012-05-12 – 2012-05-17 (×6): 10 mg via ORAL
  Filled 2012-05-11 (×8): qty 1

## 2012-05-11 MED ORDER — HYDROCHLOROTHIAZIDE 12.5 MG PO CAPS
12.5000 mg | ORAL_CAPSULE | Freq: Every day | ORAL | Status: DC
  Administered 2012-05-12 – 2012-05-17 (×6): 12.5 mg via ORAL
  Filled 2012-05-11 (×8): qty 1

## 2012-05-11 NOTE — Progress Notes (Signed)
PCA controlling pain. Patient ambulated in the hall x1. Up in chair.

## 2012-05-11 NOTE — Progress Notes (Signed)
UR complete 

## 2012-05-12 LAB — CBC
HCT: 34.3 % — ABNORMAL LOW (ref 39.0–52.0)
Hemoglobin: 11.5 g/dL — ABNORMAL LOW (ref 13.0–17.0)
MCHC: 33.5 g/dL (ref 30.0–36.0)
RDW: 14 % (ref 11.5–15.5)
WBC: 14.3 10*3/uL — ABNORMAL HIGH (ref 4.0–10.5)

## 2012-05-12 LAB — BASIC METABOLIC PANEL
Chloride: 104 mEq/L (ref 96–112)
GFR calc Af Amer: >90 mL/min (ref >90)
GFR calc non Af Amer: >90 mL/min (ref >90)
Potassium: 3.8 mEq/L (ref 3.5–5.1)
Sodium: 138 mEq/L (ref 135–145)

## 2012-05-12 MED ORDER — BIOTENE DRY MOUTH MT LIQD
15.0000 mL | Freq: Two times a day (BID) | OROMUCOSAL | Status: DC
  Administered 2012-05-12 – 2012-05-17 (×8): 15 mL via OROMUCOSAL

## 2012-05-12 MED ORDER — MENTHOL 3 MG MT LOZG
1.0000 | LOZENGE | OROMUCOSAL | Status: DC | PRN
  Filled 2012-05-12 (×2): qty 9

## 2012-05-12 MED ORDER — GUAIFENESIN ER 600 MG PO TB12
600.0000 mg | ORAL_TABLET | Freq: Two times a day (BID) | ORAL | Status: DC
  Administered 2012-05-12 – 2012-05-17 (×11): 600 mg via ORAL
  Filled 2012-05-12 (×12): qty 1

## 2012-05-12 MED ORDER — PROMETHAZINE HCL 25 MG/ML IJ SOLN
12.5000 mg | Freq: Four times a day (QID) | INTRAMUSCULAR | Status: DC | PRN
  Administered 2012-05-13: 12.5 mg via INTRAVENOUS
  Filled 2012-05-12: qty 1

## 2012-05-12 NOTE — Progress Notes (Signed)
2 Days Post-Op  Subjective: Pt having some pain.  No belching.  No nausea.  Having issues taking deep breaths.    Objective: Vital signs in last 24 hours: Temp:  [98.1 F (36.7 C)-98.9 F (37.2 C)] 98.1 F (36.7 C) (05/17 1400) Pulse Rate:  [96-102] 98  (05/17 1400) Resp:  [16-28] 20  (05/17 1555) BP: (128-136)/(78-83) 128/82 mmHg (05/17 1400) SpO2:  [98 %-100 %] 99 % (05/17 1555) Last BM Date: 05/09/12  Intake/Output from previous day: 05/16 0701 - 05/17 0700 In: 2993.1 [P.O.:1080; I.V.:1913.1] Out: 2900 [Urine:2900] Intake/Output this shift: Total I/O In: 1077 [I.V.:1077] Out: 500 [Urine:500]  General appearance: alert, cooperative and mild distress Chest wall: no tenderness GI: soft, appropriately tender, wounds c/d/i Extremities: extremities normal, atraumatic, no cyanosis or edema  Lab Results:   Basename 05/12/12 0450 05/11/12 0418  WBC 14.3* 12.8*  HGB 11.5* 12.5*  HCT 34.3* 36.6*  PLT 204 218   BMET  Basename 05/12/12 0450 05/11/12 0418  NA 138 135  K 3.8 4.3  CL 104 103  CO2 26 26  GLUCOSE 133* 172*  BUN 10 15  CREATININE 0.81 0.91  CALCIUM 8.1* 8.1*   PT/INR No results found for this basename: LABPROT:2,INR:2 in the last 72 hours ABG No results found for this basename: PHART:2,PCO2:2,PO2:2,HCO3:2 in the last 72 hours  Studies/Results: No results found.  Anti-infectives: Anti-infectives     Start     Dose/Rate Route Frequency Ordered Stop   05/11/12 1400   ertapenem (INVANZ) 1 g in sodium chloride 0.9 % 50 mL IVPB        1 g 100 mL/hr over 30 Minutes Intravenous Every 24 hours 05/10/12 1723 05/11/12 1434   05/10/12 1126   ertapenem (INVANZ) 1 g in sodium chloride 0.9 % 50 mL IVPB        1 g 100 mL/hr over 30 Minutes Intravenous 60 min pre-op 05/10/12 1126 05/10/12 1325          Assessment/Plan: s/p Procedure(s) (LRB): LAPAROSCOPIC ILEOCECECTOMY (N/A) PAS Continue foley due to urinary output monitoring Sips of clears OK.     Ambulate Aggressive pulmonary toilet for atelectasis. Entereg for bowel function.   HCTZ, lisinopril for HTN   LOS: 2 days    [facility] 05/12/2012

## 2012-05-12 NOTE — Progress Notes (Signed)
Patient ID: Jesse Arroyo, male   DOB: September 14, 1961, 51 y.o.   MRN: [id number] 2 Days Post-Op  Subjective: Pt sweaty and having issues coughing up mucous.  Keeps having to stop talking to cough.  Pain OK with meds, but sore.    Objective: Vital signs in last 24 hours: Temp:  [97.9 F (36.6 C)-98.9 F (37.2 C)] 97.9 F (36.6 C) (05/17 1800) Pulse Rate:  [96-116] 116  (05/17 1800) Resp:  [16-28] 20  (05/17 1555) BP: (128-152)/(78-87) 152/87 mmHg (05/17 1800) SpO2:  [94 %-100 %] 94 % (05/17 1800) Last BM Date: 05/09/12  Intake/Output from previous day: 05/16 0701 - 05/17 0700 In: 2993.1 [P.O.:1080; I.V.:1913.1] Out: 2900 [Urine:2900] Intake/Output this shift: Total I/O In: 1077 [I.V.:1077] Out: 500 [Urine:500]  General appearance: alert, cooperative and moderate distress, sweaty Chest wall: no tenderness GI: soft, appropriately tender, wounds c/d/i, slightly distended Extremities: extremities normal, atraumatic, no cyanosis or edema  Lab Results:   Basename 05/12/12 0450 05/11/12 0418  WBC 14.3* 12.8*  HGB 11.5* 12.5*  HCT 34.3* 36.6*  PLT 204 218   BMET  Basename 05/12/12 0450 05/11/12 0418  NA 138 135  K 3.8 4.3  CL 104 103  CO2 26 26  GLUCOSE 133* 172*  BUN 10 15  CREATININE 0.81 0.91  CALCIUM 8.1* 8.1*   PT/INR No results found for this basename: LABPROT:2,INR:2 in the last 72 hours ABG No results found for this basename: PHART:2,PCO2:2,PO2:2,HCO3:2 in the last 72 hours  Studies/Results: No results found.  Anti-infectives: Anti-infectives     Start     Dose/Rate Route Frequency Ordered Stop   05/11/12 1400   ertapenem (INVANZ) 1 g in sodium chloride 0.9 % 50 mL IVPB        1 g 100 mL/hr over 30 Minutes Intravenous Every 24 hours 05/10/12 1723 05/11/12 1434   05/10/12 1126   ertapenem (INVANZ) 1 g in sodium chloride 0.9 % 50 mL IVPB        1 g 100 mL/hr over 30 Minutes Intravenous 60 min pre-op 05/10/12 1126 05/10/12 1325           Assessment/Plan: s/p Procedure(s) (LRB): LAPAROSCOPIC ILEOCECECTOMY (N/A) PAS D/C foley today Sips of clears only OK.   Ambulate Aggressive pulmonary toilet for atelectasis. Entereg for bowel function.   HCTZ, lisinopril for HTN May need to get CXR if cough continues.   LOS: 2 days    [facility] 05/12/2012

## 2012-05-13 LAB — CBC
HCT: 44.9 % (ref 39.0–52.0)
Hemoglobin: 15.4 g/dL (ref 13.0–17.0)
MCH: 31.2 pg (ref 26.0–34.0)
MCHC: 34.3 g/dL (ref 30.0–36.0)

## 2012-05-13 LAB — BASIC METABOLIC PANEL
BUN: 10 mg/dL (ref 6–23)
Calcium: 8.9 mg/dL (ref 8.4–10.5)
GFR calc non Af Amer: >90 mL/min (ref >90)
Glucose, Bld: 158 mg/dL — ABNORMAL HIGH (ref 70–99)

## 2012-05-13 MED ORDER — BISACODYL 10 MG RE SUPP
10.0000 mg | Freq: Once | RECTAL | Status: AC
  Administered 2012-05-13: 10 mg via RECTAL
  Filled 2012-05-13: qty 1

## 2012-05-13 NOTE — Progress Notes (Signed)
Patient ID: Jesse Arroyo, male   DOB: [birth year], 51 y.o.   MRN: [id number]  General Surgery - [facility] - Progress Note  POD# 3  Subjective: Patient diaphorectic, somnolent, nausea after phenergan administration.  No flatus, no BM.  Ambulatory.  Objective: Vital signs in last 24 hours: Temp:  [97.9 F (36.6 C)-98.9 F (37.2 C)] 98.4 F (36.9 C) (05/18 0609) Pulse Rate:  [98-116] 98  (05/18 0609) Resp:  [18-26] 18  (05/18 0755) BP: (125-152)/(82-97) 125/84 mmHg (05/18 0609) SpO2:  [94 %-100 %] 99 % (05/18 0755) Last BM Date: 05/09/12  Intake/Output from previous day: 05/17 0701 - 05/18 0700 In: 1077 [I.V.:1077] Out: 950 [Urine:950]  Exam: HEENT - clear, not icteric Neck - soft Chest - clear bilaterally Cor - RRR, no murmur Abd - moderate distension; no BS; no flatus; no BM; On-Q removed; wound clear and dry Ext - no significant edema Neuro - grossly intact, no focal deficits  Lab Results:   Basename 05/12/12 0450 05/11/12 0418  WBC 14.3* 12.8*  HGB 11.5* 12.5*  HCT 34.3* 36.6*  PLT 204 218     Basename 05/12/12 0450 05/11/12 0418  NA 138 135  K 3.8 4.3  CL 104 103  CO2 26 26  GLUCOSE 133* 172*  BUN 10 15  CREATININE 0.81 0.91  CALCIUM 8.1* 8.1*    Studies/Results: No results found.  Assessment / Plan: 1.  Status post ileocecectomy for polyp at IC valve  - clear liquid diet  - trial dulcolax supp  - OOB, ambulate as tolerated  - discontinue phenergan IV  - check labs today  Velora Heckler, MD, [facility] Office: [phone]  05/13/2012

## 2012-05-13 NOTE — Progress Notes (Signed)
Pt request clear liquid diet stated has not had anything to eat in 6 days requested contact surgeon on call for a diet of clear liquid paged dr Daphine Deutscher on call for patient request. Annitta Needs, RN

## 2012-05-13 NOTE — Progress Notes (Signed)
Pt had episode of diarrhea 

## 2012-05-13 NOTE — Progress Notes (Signed)
Patient ID: Jesse Arroyo, male   DOB: 1961-12-07, 51 y.o.   MRN: [id number] Patient seen and examined.  He is passing gas and stool.  Feels much better and wants something to drink.  WBC noted 16.9k. Will offer clear liquids PO.  Discussed with family in the room.

## 2012-05-14 ENCOUNTER — Inpatient Hospital Stay (HOSPITAL_COMMUNITY): Payer: 59

## 2012-05-14 LAB — URINALYSIS, ROUTINE W REFLEX MICROSCOPIC
Bilirubin Urine: NEGATIVE
Hgb urine dipstick: NEGATIVE
Ketones, ur: NEGATIVE mg/dL
Protein, ur: NEGATIVE mg/dL
Urobilinogen, UA: 1 mg/dL (ref 0.0–1.0)

## 2012-05-14 NOTE — Progress Notes (Signed)
Pharmacy Brief Note - Alvimopan (Entereg)  The standing order set for alvimopan (Entereg) now includes an automatic order to discontinue the drug after the patient has had a bowel movement.  The change was approved by the Pharmacy & Therapeutics Committee and the Medical Executive Committee.    This patient has had 4 bowel movements documented by nursing.  Therefore, alvimopan has been discontinued.  If there are questions, please contact the pharmacy at [phone].  Thank you.  Darrol Angel, PharmD Pager: [phone] 05/14/2012 2:23 PM

## 2012-05-14 NOTE — Progress Notes (Signed)
Patient ID: Jesse Arroyo, male   DOB: March 26, 1961, 51 y.o.   MRN: [id number]  General Surgery - [facility] - Progress Note  POD# 4  Subjective: Pt feeling much better.  Had 4 BMs and is passing flatus now.    Objective: Vital signs in last 24 hours: Temp:  [98 F (36.7 C)-98.7 F (37.1 C)] 98.2 F (36.8 C) (05/19 0516) Pulse Rate:  [86-110] 86  (05/19 0516) Resp:  [16-23] 16  (05/19 0516) BP: (104-129)/(71-84) 113/74 mmHg (05/19 0516) SpO2:  [95 %-100 %] 95 % (05/19 0516) Last BM Date: 05/12/12  Intake/Output from previous day: 05/18 0701 - 05/19 0700 In: 3990 [P.O.:75; I.V.:3915] Out: -   Exam: HEENT - clear, not icteric Neck - soft Abd - moderate distension; no erythema Ext - no significant edema Neuro - grossly intact, no focal deficits  Lab Results:   Basename 05/13/12 1115 05/12/12 0450  WBC 16.9* 14.3*  HGB 15.4 11.5*  HCT 44.9 34.3*  PLT 324 204     Basename 05/13/12 1115 05/12/12 0450  NA 136 138  K 3.9 3.8  CL 99 104  CO2 27 26  GLUCOSE 158* 133*  BUN 10 10  CREATININE 0.92 0.81  CALCIUM 8.9 8.1*    Studies/Results: No results found.  Assessment / Plan: 1.  Status post ileocecectomy for polyp at IC valve  - full liquid diet  - OOB, ambulate as tolerated  - check labs today    05/14/2012

## 2012-05-14 NOTE — Progress Notes (Signed)
Pt has not used PCA all day Annitta Needs, RN

## 2012-05-15 ENCOUNTER — Other Ambulatory Visit: Payer: Self-pay | Admitting: Family Medicine

## 2012-05-15 MED ORDER — OXYCODONE-ACETAMINOPHEN 5-325 MG PO TABS
1.0000 | ORAL_TABLET | ORAL | Status: DC | PRN
  Administered 2012-05-15: 1 via ORAL
  Administered 2012-05-16: 0.5 via ORAL
  Filled 2012-05-15 (×2): qty 1

## 2012-05-15 NOTE — Progress Notes (Signed)
Seen and agree.  I gave him path report.  Looks well

## 2012-05-15 NOTE — Progress Notes (Signed)
5 Days Post-Op  Subjective: Concerned about path and walking vigorously in the halls.  Objective: Vital signs in last 24 hours: Temp:  [98 F (36.7 C)-98.7 F (37.1 C)] 98.3 F (36.8 C) (05/20 0600) Pulse Rate:  [97-123] 101  (05/20 0600) Resp:  [15-24] 16  (05/20 1200) BP: (113-130)/(67-79) 113/67 mmHg (05/20 0600) SpO2:  [91 %-100 %] 98 % (05/20 1200) Last BM Date: 05/13/12  4 BM 5/18, none yesterday, 1 today so far, no PO recorded yesterday.  Walking halls without difficulty, afebrile, VSS, he stays a little tachycardic, last WBC was 5/18. He thinks he's ready to go up in his diet  Intake/Output from previous day: 05/19 0701 - 05/20 0700 In: 1212.5 [I.V.:1212.5] Out: -  Intake/Output this shift: Total I/O In: 75 [P.O.:75] Out: -   General appearance: alert, cooperative and no distress Resp: BS clear GI: +BS, wound OK +BM and flatus  Lab Results:   [facility] 05/13/12 1115  WBC 16.9*  HGB 15.4  HCT 44.9  PLT 324    BMET  Basename 05/13/12 1115  NA 136  K 3.9  CL 99  CO2 27  GLUCOSE 158*  BUN 10  CREATININE 0.92  CALCIUM 8.9   PT/INR No results found for this basename: LABPROT:2,INR:2 in the last 72 hours  No results found for this basename: AST:5,ALT:5,ALKPHOS:5,BILITOT:5,PROT:5,ALBUMIN:5 in the last 168 hours   Lipase  No results found for this basename: lipase     Studies/Results: Dg Chest 2 View  05/14/2012  *RADIOLOGY REPORT*  Clinical Data: Rule out infiltrate  CHEST - 2 VIEW  Comparison: 05/03/2012  Findings: Right lower lobe infiltrate has increased since the prior study.  There is now a small right pleural effusion.  This may represent pneumonia.  Negative for heart failure.  Small left effusion also noted.  Diffuse bowel dilatation has progressed and may represent ileus.  IMPRESSION: Progression of right lower lobe infiltrate and effusion, suspicious for pneumonia.  Original Report Authenticated By: Camelia Phenes, M.D.    Medications:     . antiseptic oral rinse  15 mL Mouth Rinse BID  . enoxaparin  40 mg Subcutaneous Q24H  . guaiFENesin  600 mg Oral BID  . hydrochlorothiazide  12.5 mg Oral Daily  . lisinopril  10 mg Oral Daily  . morphine   Intravenous Q4H  . DISCONTD: alvimopan  12 mg Oral BID    Assessment/Plan tubulovillous adenoma s/p Laparoscopic Partial Colectomy (R hemicolectomy) 05/10/12 Dr. Donell Beers Path:  Large Sessile Serrated Adenoma, 14 nodes negative for neoplasm, margins are negative, and appenid, no inflammation or neoplasm   Plan:  Advance diet, d/c PCA, recheck labs in AM. May be able to get home tomorrow.  LOS: 5 days    Jesse Arroyo 05/15/2012

## 2012-05-16 LAB — COMPREHENSIVE METABOLIC PANEL
AST: 18 U/L (ref 0–37)
Albumin: 2.5 g/dL — ABNORMAL LOW (ref 3.5–5.2)
CO2: 26 mEq/L (ref 19–32)
Calcium: 8.6 mg/dL (ref 8.4–10.5)
Creatinine, Ser: 0.81 mg/dL (ref 0.50–1.35)
GFR calc non Af Amer: >90 mL/min (ref >90)

## 2012-05-16 LAB — CBC
MCH: 31 pg (ref 26.0–34.0)
MCV: 89.9 fL (ref 78.0–100.0)
Platelets: 261 10*3/uL (ref 150–400)
RDW: 13.4 % (ref 11.5–15.5)

## 2012-05-16 MED ORDER — LEVOFLOXACIN 750 MG PO TABS
750.0000 mg | ORAL_TABLET | Freq: Every day | ORAL | Status: DC
  Administered 2012-05-16 – 2012-05-17 (×2): 750 mg via ORAL
  Filled 2012-05-16 (×2): qty 1

## 2012-05-16 NOTE — Progress Notes (Signed)
6 Days Post-Op  Subjective: Sore.  Coughing some.  Objective: Vital signs in last 24 hours: Temp:  [98.8 F (37.1 C)-100.4 F (38 C)] 100.4 F (38 C) (05/21 0558) Pulse Rate:  [100-111] 100  (05/21 0558) Resp:  [16-18] 18  (05/21 0558) BP: (129-135)/(70-75) 129/75 mmHg (05/21 0558) SpO2:  [94 %-98 %] 98 % (05/21 0558) Last BM Date: 05/13/12  Intake/Output from previous day: 05/20 0701 - 05/21 0700 In: 1962.5 [P.O.:760; I.V.:1202.5] Out: 0  Intake/Output this shift:    PE: Lungs-clear Abd-soft, incisions clean/dry/intact  Lab Results:   Basename 05/16/12 0412 05/13/12 1115  WBC 12.7* 16.9*  HGB 11.7* 15.4  HCT 33.9* 44.9  PLT 261 324   BMET  Basename 05/16/12 0412 05/13/12 1115  NA 135 136  K 3.4* 3.9  CL 99 99  CO2 26 27  GLUCOSE 129* 158*  BUN 8 10  CREATININE 0.81 0.92  CALCIUM 8.6 8.9   PT/INR No results found for this basename: LABPROT:2,INR:2 in the last 72 hours Comprehensive Metabolic Panel:    Component Value Date/Time   NA 135 05/16/2012 0412   K 3.4* 05/16/2012 0412   CL 99 05/16/2012 0412   CO2 26 05/16/2012 0412   BUN 8 05/16/2012 0412   CREATININE 0.81 05/16/2012 0412   GLUCOSE 129* 05/16/2012 0412   GLUCOSE 89 10/06/2006 1114   CALCIUM 8.6 05/16/2012 0412   AST 18 05/16/2012 0412   ALT 24 05/16/2012 0412   ALKPHOS 39 05/16/2012 0412   BILITOT 0.7 05/16/2012 0412   PROT 5.8* 05/16/2012 0412   ALBUMIN 2.5* 05/16/2012 0412     Studies/Results: No results found.  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  Heplock IV.   Jesse Arroyo Shela Commons 05/16/2012

## 2012-05-16 NOTE — Progress Notes (Signed)
UR complete 

## 2012-05-17 LAB — CREATININE, SERUM
Creatinine, Ser: 0.85 mg/dL (ref 0.50–1.35)
GFR calc non Af Amer: >90 mL/min (ref >90)

## 2012-05-17 LAB — CBC
MCH: 30.6 pg (ref 26.0–34.0)
Platelets: 297 10*3/uL (ref 150–400)
RBC: 3.89 MIL/uL — ABNORMAL LOW (ref 4.22–5.81)
RDW: 13.3 % (ref 11.5–15.5)

## 2012-05-17 MED ORDER — OXYCODONE-ACETAMINOPHEN 5-325 MG PO TABS: 1.0000 | ORAL_TABLET | ORAL | Status: AC | PRN

## 2012-05-17 MED ORDER — LEVOFLOXACIN 750 MG PO TABS: 750.0000 mg | ORAL_TABLET | Freq: Every day | ORAL | Status: AC

## 2012-05-17 NOTE — Progress Notes (Signed)
7 Days Post-Op  Subjective: Tolerating diet.  Bowels moving.  Objective: Vital signs in last 24 hours: Temp:  [97.9 F (36.6 C)-99.2 F (37.3 C)] 97.9 F (36.6 C) (05/22 0620) Pulse Rate:  [94-110] 94  (05/22 0620) Resp:  [16-20] 18  (05/22 0620) BP: (130-142)/(78-87) 138/81 mmHg (05/22 0620) SpO2:  [94 %-100 %] 94 % (05/22 0620) Last BM Date: 05/16/12  Intake/Output from previous day: 05/21 0701 - 05/22 0700 In: 480 [P.O.:480] Out: 650 [Urine:650] Intake/Output this shift:    PE: Abd-soft, incisions clean/dry/intact  Lab Results:   Basename 05/17/12 0420 05/16/12 0412  WBC 9.5 12.7*  HGB 11.9* 11.7*  HCT 34.5* 33.9*  PLT 297 261   BMET  Basename 05/17/12 0420 05/16/12 0412  NA -- 135  K -- 3.4*  CL -- 99  CO2 -- 26  GLUCOSE -- 129*  BUN -- 8  CREATININE 0.85 0.81  CALCIUM -- 8.6   PT/INR No results found for this basename: LABPROT:2,INR:2 in the last 72 hours Comprehensive Metabolic Panel:    Component Value Date/Time   NA 135 05/16/2012 0412   K 3.4* 05/16/2012 0412   CL 99 05/16/2012 0412   CO2 26 05/16/2012 0412   BUN 8 05/16/2012 0412   CREATININE 0.85 05/17/2012 0420   GLUCOSE 129* 05/16/2012 0412   GLUCOSE 89 10/06/2006 1114   CALCIUM 8.6 05/16/2012 0412   AST 18 05/16/2012 0412   ALT 24 05/16/2012 0412   ALKPHOS 39 05/16/2012 0412   BILITOT 0.7 05/16/2012 0412   PROT 5.8* 05/16/2012 0412   ALBUMIN 2.5* 05/16/2012 0412     Studies/Results: No results found.  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  Discharge.  Continue Levaquin for 10 days. F/u with Dr. Donell Beers in 2 weeks.   Jesse Arroyo 05/17/2012

## 2012-05-17 NOTE — Progress Notes (Signed)
Patient provided with discharge instructions and prescription. Patient verbalized understanding. Patient discharged to home. 

## 2012-05-23 NOTE — Discharge Summary (Signed)
Physician Discharge Summary  Patient ID: Jesse Arroyo MRN: [id number] DOB/AGE: [birth year] 51 y.o.  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  He did reasonably well overnight, but POD 2, was having low grade fevers and sweats.  His white blood cell count also went up.  He got a pretty significant ileus, and had to be made NPO again.  He also developed a productive cough.  An xray demonstrated pneumonia, and he was started on po antibiotics.  His ileus resolved.  He was able to ambulate.  He was discharged to home in stable condition.  Consults: None  Significant Diagnostic Studies: radiology: CXR: infiltrates: lower lobe on the right  Treatments: antibiotics: Levaquin and surgery: see above.  Discharge Exam: Blood pressure 138/81, pulse 94, temperature 97.9 F (36.6 C), temperature source Oral, resp. rate 18, height 6' (1.829 m), weight 214 lb (97.07 kg), SpO2 94.00%. General appearance: alert and no distress Chest wall: Decreased right lower breath sounds. GI: soft, appropriately tender, wounds OK.  Disposition: 01-Home or Self Care   Medication List  As of 05/23/2012  3:05 PM   TAKE these medications         aspirin 81 MG tablet   Take 81 mg by mouth daily with breakfast.      fluocinonide cream 0.05 %   Commonly known as: LIDEX   Apply 1 application topically 2 (two) times daily.      levofloxacin 750 MG tablet   Commonly known as: LEVAQUIN   Take 1 tablet (750 mg total) by mouth daily.      lisinopril-hydrochlorothiazide 10-12.5 MG per tablet   Commonly known as: PRINZIDE,ZESTORETIC   TAKE 1 TABLET EVERY MORNING      multivitamin tablet   Take 1 tablet by mouth  daily with breakfast.      oxyCODONE-acetaminophen 5-325 MG per tablet   Commonly known as: PERCOCET   Take 1-2 tablets by mouth every 4 (four) hours as needed.      triamcinolone cream 0.5 %   Commonly known as: KENALOG   Apply 1 application topically 3 (three) times daily. Apply nightly           Follow-up Information    Follow up with [facility], [place]. Schedule an appointment as soon as possible for a visit in 3 weeks.   Contact information:   [employer] [address] [place] [address] [phone]          Signed: Almond Lint 05/23/2012, 3:05 PM

## 2012-06-02 ENCOUNTER — Telehealth (INDEPENDENT_AMBULATORY_CARE_PROVIDER_SITE_OTHER): Payer: Self-pay | Admitting: General Surgery

## 2012-06-02 NOTE — Telephone Encounter (Signed)
The patient contacted the office stating the incision over his umbilicus is bumpy and red, denies any drainage, no heat to touch and he is afebrile. He stated he is using neosporin. Recommended that he continue with the neosporin and wear clothing that does not rub the area. Patient has a scheduled appt 06/05/12 and is told to call back if symptoms get worse.

## 2012-06-03 ENCOUNTER — Other Ambulatory Visit: Payer: Self-pay | Admitting: Family Medicine

## 2012-06-06 ENCOUNTER — Ambulatory Visit (INDEPENDENT_AMBULATORY_CARE_PROVIDER_SITE_OTHER): Payer: 59 | Admitting: General Surgery

## 2012-06-06 ENCOUNTER — Encounter (INDEPENDENT_AMBULATORY_CARE_PROVIDER_SITE_OTHER): Payer: Self-pay | Admitting: General Surgery

## 2012-06-06 VITALS — BP 102/68 | HR 82 | Temp 97.9°F | Ht 72.0 in | Wt 201.4 lb

## 2012-06-06 DIAGNOSIS — D126 Benign neoplasm of colon, unspecified: Secondary | ICD-10-CM

## 2012-06-06 MED ORDER — HYDROCODONE-ACETAMINOPHEN 5-500 MG PO TABS: 1.0000 | ORAL_TABLET | Freq: Four times a day (QID) | ORAL | Status: AC | PRN

## 2012-06-06 NOTE — Assessment & Plan Note (Signed)
Doing OK post op Seroma of wound.    Follow up wound in 3 weeks.  OK to go back to work 6/20.  Advised pt to do half days for 2 days, then he can see how he does.  He is still very tired from surgery, and he still is fairly sore.  Advised to take laxative for small volume frequent stools.

## 2012-06-06 NOTE — Patient Instructions (Signed)
Remove packing tomorrow in shower.   Dry dressing as needed to protect clothes. Expect drainage for a week or so.  Follow up with me in 3 weeks. OK to go back to work 6/20

## 2012-06-06 NOTE — Progress Notes (Signed)
HISTORY: Pt is s/p lap R hemicolectomy for serrated adenoma at the ileocecal valve.  He is still a bit sore, and tiring out easily.  He is also having frequent small volume stools.  The stools are soft, but "look like stamey's hushpuppies."  He also complains of a 'blister' at his midline incision.      EXAM: General:  Alert and oriented.  No distress Incision:  Granulation tissue.  Opened with seroma present.  No erythema or warmth of incision.     PATHOLOGY: Serrated adenoma.   ASSESSMENT AND PLAN:   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 Doing OK post op Seroma of wound.    Follow up wound in 3 weeks.  OK to go back to work 6/20.  Advised pt to do half days for 2 days, then he can see how he does.  He is still very tired from surgery, and he still is fairly sore.  Advised to take laxative for small volume frequent stools.            Maudry Diego, MD Surgical Oncology, General & Endocrine Surgery [facility]  Evette Georges, MD, MD Roderick Pee, MD

## 2012-06-26 ENCOUNTER — Ambulatory Visit (INDEPENDENT_AMBULATORY_CARE_PROVIDER_SITE_OTHER): Payer: 59 | Admitting: General Surgery

## 2012-06-26 ENCOUNTER — Encounter (INDEPENDENT_AMBULATORY_CARE_PROVIDER_SITE_OTHER): Payer: Self-pay | Admitting: General Surgery

## 2012-06-26 VITALS — BP 142/82 | HR 103 | Temp 97.2°F | Resp 16 | Ht 72.0 in | Wt 198.4 lb

## 2012-06-26 DIAGNOSIS — D126 Benign neoplasm of colon, unspecified: Secondary | ICD-10-CM

## 2012-06-26 NOTE — Patient Instructions (Signed)
You will need a colonoscopy in 1-3 years (Dr. Marzetta Board office will call you).  Follow up with me as needed.

## 2012-06-26 NOTE — Progress Notes (Signed)
HISTORY: Pt s/p right lap hemicolectomy for large adenoma.  Still having some stool irregularity, but getting better.  On no narcotics.  No fevers.  No n/v.  Back at work.    EXAM: General:   Alert and oriented.  Incision:  Wound closed, but possible area that may open up.     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   Pt with possible stitch granuloma.    Pt advised that this may open back up.   He will call.       Maudry Diego, MD Surgical Oncology, General & Endocrine Surgery [facility]  Evette Georges, MD Roderick Pee, MD

## 2012-06-26 NOTE — Assessment & Plan Note (Signed)
Follow up as needed.   Pt with possible stitch granuloma.    Pt advised that this may open back up.   He will call.

## 2012-07-26 ENCOUNTER — Ambulatory Visit (INDEPENDENT_AMBULATORY_CARE_PROVIDER_SITE_OTHER): Payer: 59 | Admitting: Family Medicine

## 2012-07-26 ENCOUNTER — Encounter: Payer: Self-pay | Admitting: Family Medicine

## 2012-07-26 VITALS — BP 110/80 | Temp 98.1°F | Wt 199.0 lb

## 2012-07-26 DIAGNOSIS — R310 Gross hematuria: Secondary | ICD-10-CM

## 2012-07-26 DIAGNOSIS — R319 Hematuria, unspecified: Secondary | ICD-10-CM

## 2012-07-26 LAB — POCT URINALYSIS DIPSTICK
Bilirubin, UA: NEGATIVE
Nitrite, UA: NEGATIVE
Protein, UA: NEGATIVE
Urobilinogen, UA: 0.2
pH, UA: 7

## 2012-07-26 NOTE — Progress Notes (Signed)
  Subjective:    Patient ID: Jesse Arroyo, male    DOB: 1961-05-22, 51 y.o.   MRN: [id number]  HPI Jesse Arroyo is a 51 year old married male nonsmoker who comes in today for evaluation of hematuria  He states that about 3 days ago he hit the end of urination he started pain bright red blood. It's diminished since that time. No fever chills back pain testicular pain or previous history of hematuria. No history of trauma  Recently had a benign lesion removed from his colon and had to be done in the OR.   Review of Systems General and urologic review of systems otherwise negative    Objective:   Physical Exam Well-developed well-nourished male no acute distress urinalysis shows 4+ blood otherwise normal       Assessment & Plan:  Hematuria plan stop aspirin and urologic consult if bleeding persists

## 2012-07-26 NOTE — Patient Instructions (Addendum)
Drink lots of water  Stop the aspirin  Consult with urology if the bleeding persists  Also return ASAP for removal of the 2 red lesions on your back

## 2012-07-31 ENCOUNTER — Ambulatory Visit (INDEPENDENT_AMBULATORY_CARE_PROVIDER_SITE_OTHER): Payer: 59 | Admitting: Family Medicine

## 2012-07-31 ENCOUNTER — Encounter: Payer: Self-pay | Admitting: Family Medicine

## 2012-07-31 DIAGNOSIS — L989 Disorder of the skin and subcutaneous tissue, unspecified: Secondary | ICD-10-CM

## 2012-07-31 DIAGNOSIS — D239 Other benign neoplasm of skin, unspecified: Secondary | ICD-10-CM | POA: Insufficient documentation

## 2012-07-31 NOTE — Patient Instructions (Signed)
Removed the Band-Aids tomorrow  Within 2 weeks we will call you the report

## 2012-07-31 NOTE — Progress Notes (Signed)
  Subjective:    Patient ID: Jesse Arroyo, male    DOB: 28-Oct-1961, 51 y.o.   MRN: [id number]  HPI Jesse Arroyo is a 51 year old married male nonsmoker comes in today for removal of 3 lesions  Lesion #1 is a 5 mm x5 mm black lesion left hip  Lesion #2 is a 10 MM by 10 MM lesion to the right of L1  Lesion #3 is a 10 MM x 10 MM lesion in the left posterior axillary line below the scapula   Review of Systems    dermatologic review of systems otherwise negative Objective:   Physical Exam  Procedure after informed consent each lesion was cleaned with alcohol and then anesthetized with 1% Xylocaine with epinephrine. The lesions were removed in toto with 3 mm margins and sent for pathologic analysis. The bases were cauterized Band-Aids were applied he tolerated the procedure well no complications. Clinically they appear to be dysplastic nevi Path pending      Assessment & Plan:  Dysplastic nevi x3 Path pending

## 2013-01-02 ENCOUNTER — Encounter (INDEPENDENT_AMBULATORY_CARE_PROVIDER_SITE_OTHER): Payer: Self-pay | Admitting: General Surgery

## 2013-01-02 ENCOUNTER — Ambulatory Visit (INDEPENDENT_AMBULATORY_CARE_PROVIDER_SITE_OTHER): Payer: 59 | Admitting: General Surgery

## 2013-01-02 VITALS — BP 126/70 | HR 77 | Temp 97.8°F | Resp 18 | Ht 72.0 in | Wt 196.0 lb

## 2013-01-02 DIAGNOSIS — K432 Incisional hernia without obstruction or gangrene: Secondary | ICD-10-CM

## 2013-01-02 NOTE — Assessment & Plan Note (Signed)
Patient with small 3 cm reducible incisional hernia that is minimally symptomatic.  Is currently bothered some by would like to time surgery for a better time for his job.  He is going to back and see me in April to set up surgery. -I have sent a message to Dr. Arlyce Dice regarding timing of followup colonoscopy.   I recommended that he wear a binder or lifting corset when he is doing strenuous activity. If this hernia is getting larger he can come back earlier.  I also discussed incarceration and symptoms of incarceration. I advised him that if this occurs this is an emergency.

## 2013-01-02 NOTE — Patient Instructions (Addendum)
Wear lifting belt while doing strenuous activity.  Follow up in April.  I have sent message to Dr. Arlyce Dice regarding colonoscopy.

## 2013-01-02 NOTE — Progress Notes (Signed)
HISTORY: Patient is a 52 year old male who underwent right laparoscopic hemicolectomy for a large tubulovillous adenoma at the ileocecal valve. He was visiting his daughter multiple times in November and noticed a small bulge in his abdomen at that time. He does have to rub it it because of the discomfort. When he is standing, he does see a bulge in this area.  He is also having issues with his stool.     PERTINENT REVIEW OF SYSTEMS: Otherwise negative.     EXAM: Head: Normocephalic and atraumatic.  Eyes:  Conjunctivae are normal. Pupils are equal, round, and reactive to light. No scleral icterus.   Resp: No respiratory distress, normal effort. Abd:  Abdomen is soft, non distended and non tender. 3 cm incisional hernia  There is no rebound and no guarding.  Neurological: Alert and oriented to person, place, and time. Coordination normal.  Skin: Skin is warm and dry. No rash noted. No diaphoretic. No erythema. No pallor.  Psychiatric: Normal mood and affect. Normal behavior. Judgment and thought content normal.      ASSESSMENT AND PLAN:   Incisional hernia Patient with small 3 cm reducible incisional hernia that is minimally symptomatic.  Is currently bothered some by would like to time surgery for a better time for his job.  He is going to back and see me in April to set up surgery. -I have sent a message to Dr. Arlyce Dice regarding timing of followup colonoscopy.   I recommended that he wear a binder or lifting corset when he is doing strenuous activity. If this hernia is getting larger he can come back earlier.  I also discussed incarceration and symptoms of incarceration. I advised him that if this occurs this is an emergency.    Maudry Diego, MD Surgical Oncology, General & Endocrine Surgery [facility]  Evette Georges, MD Roderick Pee, MD

## 2013-02-11 ENCOUNTER — Other Ambulatory Visit: Payer: Self-pay | Admitting: Family Medicine

## 2013-03-26 ENCOUNTER — Encounter: Payer: Self-pay | Admitting: Gastroenterology

## 2013-08-28 ENCOUNTER — Ambulatory Visit (AMBULATORY_SURGERY_CENTER): Payer: 59

## 2013-08-28 VITALS — Ht 72.0 in | Wt 196.0 lb

## 2013-08-28 DIAGNOSIS — Z860100 Personal history of colon polyps, unspecified: Secondary | ICD-10-CM

## 2013-08-28 MED ORDER — SUPREP BOWEL PREP KIT 17.5-3.13-1.6 GM/177ML PO SOLN: 1.0000 | Freq: Once | ORAL | Status: DC

## 2013-08-29 ENCOUNTER — Encounter: Payer: Self-pay | Admitting: Gastroenterology

## 2013-09-10 ENCOUNTER — Encounter: Payer: Self-pay | Admitting: Gastroenterology

## 2013-09-10 ENCOUNTER — Ambulatory Visit (AMBULATORY_SURGERY_CENTER): Payer: 59 | Admitting: Gastroenterology

## 2013-09-10 VITALS — BP 130/78 | HR 66 | Temp 96.6°F | Resp 24 | Ht 72.0 in | Wt 196.0 lb

## 2013-09-10 DIAGNOSIS — Z86010 Personal history of colonic polyps: Secondary | ICD-10-CM

## 2013-09-10 MED ORDER — SODIUM CHLORIDE 0.9 % IV SOLN: 500.0000 mL | INTRAVENOUS | Status: DC

## 2013-09-10 NOTE — Progress Notes (Signed)
Procedure ends, to recovery, report given and VSS. 

## 2013-09-10 NOTE — Op Note (Addendum)
[facility] [address] [place], [address]   COLONOSCOPY PROCEDURE REPORT  PATIENT: Jesse Arroyo, Jesse Arroyo  MR#: [id number] BIRTHDATE: 1961-05-02 , 52  yrs. old GENDER: Male ENDOSCOPIST: Louis Meckel, MD REFERRED BY: PROCEDURE DATE:  09/10/2013 PROCEDURE:   Colonoscopy, diagnostic First Screening Colonoscopy - Avg.  risk and is 50 yrs.  old or older - No.  Prior Negative Screening - Now for repeat screening. N/A  History of Adenoma - Now for follow-up colonoscopy & has been > or = to 3 yrs.  No.  It has been less than 3 yrs since last colonoscopy.  Other: See Comments  Polyps Removed Today? No. Recommend repeat exam, <10 yrs? Yes.  High risk (family or personal hx). ASA CLASS:   Class III INDICATIONS:Patient's personal history of adenomatous colon polyps. status post ileocecectomy for 3 x 2 cm sessile serrated adenoma encompassing the ileocecal bowel MEDICATIONS: propofol (Diprivan) 300mg  IV  DESCRIPTION OF PROCEDURE:   After the risks benefits and alternatives of the procedure were thoroughly explained, informed consent was obtained.  A digital rectal exam revealed no abnormalities of the rectum.   The LB [id number]  endoscope was introduced through the anus and advanced to the surgical anastomosis. No adverse events experienced.   The quality of the prep was excellent using Suprep  The instrument was then slowly withdrawn as the colon was fully examined.      COLON FINDINGS: A normal appearing anastamosis was identified.  The ascending, hepatic flexure, transverse, splenic flexure, descending, sigmoid colon and rectum appeared unremarkable.  No polyps or cancers were seen.  Retroflexed views revealed no abnormalities. The time to anastamosis=2 minutes 46 seconds. Withdrawal time=6 minutes 03 seconds.  The scope was withdrawn and the procedure completed. COMPLICATIONS: There were no complications.  ENDOSCOPIC IMPRESSION: Normal  colon  RECOMMENDATIONS: Colonoscopy in 3 years   eSigned:  Louis Meckel, MD 09/10/2013 5:08 PM Revised: 09/10/2013 5:08 PM  cc: Roderick Pee, MD and Almond Lint, MD

## 2013-09-10 NOTE — Progress Notes (Signed)
Patient did not experience any of the following events: a burn prior to discharge; a fall within the facility; wrong site/side/patient/procedure/implant event; or a hospital transfer or hospital admission upon discharge from the facility. (G8907) Patient did not have preoperative order for IV antibiotic SSI prophylaxis. (G8918)  

## 2013-09-10 NOTE — Patient Instructions (Signed)
Normal colon exam today. Repeat colonoscopy in 3 years.  Resume current medications. Call us with any questions or concerns. Thank you!!  YOU HAD AN ENDOSCOPIC PROCEDURE TODAY AT THE [facility]: Refer to the procedure report that was given to you for any specific questions about what was found during the examination.  If the procedure report does not answer your questions, please call your gastroenterologist to clarify.  If you requested that your care partner not be given the details of your procedure findings, then the procedure report has been included in a sealed envelope for you to review at your convenience later.  YOU SHOULD EXPECT: Some feelings of bloating in the abdomen. Passage of more gas than usual.  Walking can help get rid of the air that was put into your GI tract during the procedure and reduce the bloating. If you had a lower endoscopy (such as a colonoscopy or flexible sigmoidoscopy) you may notice spotting of blood in your stool or on the toilet paper. If you underwent a bowel prep for your procedure, then you may not have a normal bowel movement for a few days.  DIET: Your first meal following the procedure should be a light meal and then it is ok to progress to your normal diet.  A half-sandwich or bowl of soup is an example of a good first meal.  Heavy or fried foods are harder to digest and may make you feel nauseous or bloated.  Likewise meals heavy in dairy and vegetables can cause extra gas to form and this can also increase the bloating.  Drink plenty of fluids but you should avoid alcoholic beverages for 24 hours.  ACTIVITY: Your care partner should take you home directly after the procedure.  You should plan to take it easy, moving slowly for the rest of the day.  You can resume normal activity the day after the procedure however you should NOT DRIVE or use heavy machinery for 24 hours (because of the sedation medicines used during the test).    SYMPTOMS TO  REPORT IMMEDIATELY: A gastroenterologist can be reached at any hour.  During normal business hours, 8:30 AM to 5:00 PM Monday through Friday, call [phone].  After hours and on weekends, please call the GI answering service at [phone] who will take a message and have the physician on call contact you.   Following lower endoscopy (colonoscopy or flexible sigmoidoscopy):  Excessive amounts of blood in the stool  Significant tenderness or worsening of abdominal pains  Swelling of the abdomen that is new, acute  Fever of 100F or higher  Following upper endoscopy (EGD)  Vomiting of blood or coffee ground material  New chest pain or pain under the shoulder blades  Painful or persistently difficult swallowing  New shortness of breath  Fever of 100F or higher  Black, tarry-looking stools  FOLLOW UP: If any biopsies were taken you will be contacted by phone or by letter within the next 1-3 weeks.  Call your gastroenterologist if you have not heard about the biopsies in 3 weeks.  Our staff will call the home number listed on your records the next business day following your procedure to check on you and address any questions or concerns that you may have at that time regarding the information given to you following your procedure. This is a courtesy call and so if there is no answer at the home number and we have not heard from you through the emergency  physician on call, we will assume that you have returned to your regular daily activities without incident.  SIGNATURES/CONFIDENTIALITY: You and/or your care partner have signed paperwork which will be entered into your electronic medical record.  These signatures attest to the fact that that the information above on your After Visit Summary has been reviewed and is understood.  Full responsibility of the confidentiality of this discharge information lies with you and/or your care-partner.

## 2013-09-11 ENCOUNTER — Telehealth: Payer: Self-pay

## 2013-09-11 NOTE — Telephone Encounter (Signed)
  Follow up Call-  Call back number 09/10/2013 03/23/2012  Post procedure Call Back phone  # [phone]  Permission to leave phone message Yes Yes     Patient questions:  Do you have a fever, pain , or abdominal swelling? no Pain Score  0 *  Have you tolerated food without any problems? yes  Have you been able to return to your normal activities? yes  Do you have any questions about your discharge instructions: Diet   no Medications  no Follow up visit  no  Do you have questions or concerns about your Care? no  Actions: * If pain score is 4 or above: No action needed, pain <4.

## 2013-11-19 ENCOUNTER — Encounter: Payer: Self-pay | Admitting: Internal Medicine

## 2013-11-19 ENCOUNTER — Ambulatory Visit (INDEPENDENT_AMBULATORY_CARE_PROVIDER_SITE_OTHER): Payer: 59 | Admitting: Internal Medicine

## 2013-11-19 VITALS — BP 130/76 | HR 88 | Temp 97.7°F | Resp 20 | Wt 200.0 lb

## 2013-11-19 DIAGNOSIS — I10 Essential (primary) hypertension: Secondary | ICD-10-CM

## 2013-11-19 DIAGNOSIS — M5137 Other intervertebral disc degeneration, lumbosacral region: Secondary | ICD-10-CM

## 2013-11-19 DIAGNOSIS — R300 Dysuria: Secondary | ICD-10-CM

## 2013-11-19 LAB — POCT URINALYSIS DIPSTICK
Nitrite, UA: NEGATIVE
Protein, UA: 30
Urobilinogen, UA: 0.2
pH, UA: 6

## 2013-11-19 MED ORDER — CIPROFLOXACIN HCL 500 MG PO TABS: 500.0000 mg | ORAL_TABLET | Freq: Two times a day (BID) | ORAL | Status: DC

## 2013-11-19 NOTE — Progress Notes (Signed)
Subjective:    Patient ID: Jesse Arroyo, male    DOB: [birth year], 52 y.o.   MRN: [id number]  HPI  52 year old patient who was stable until 2 days ago when he had the onset of diarrhea and low-grade fever. He later developed some mild dysuria but no frequency. He is noted no change in the quality of his urine. He does have a history of brief gross hematuria in July of 2013. No history of kidney stones does have a history of lumbar disc disease chronic constipation. No high fever chills or diaphoresis. No flank pain. His diarrhea has improved. He continues to have some mild dysuria but no frequency or urgency  The urinalysis was reviewed and revealed large occult blood and trace pyuria  Past Medical History  Diagnosis Date  . Allergy   . Hypertension   . Skin abnormalities     feet    History   Social History  . Marital Status: Married    Spouse Name: N/A    Number of Children: 2  . 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 Smoking status: Former Smoker    Quit date: 12/27/1993  . Smokeless tobacco: Former Neurosurgeon    Types: Snuff    Quit date: 12/28/2007  . Alcohol Use: 2.4 oz/week    4 Glasses of wine per week  . Drug Use: No  . Sexual Activity: Not on file   Other Topics Concern  . Not on file   Social History Narrative  . No narrative on file    Past Surgical History  Procedure Laterality Date  . Wisdom tooth extraction    . Vasectomy    . Hemicolectomy  05/10/12    Family History  Problem Relation Age of Onset  . Hypertension Other   . Lung cancer Father 69  . Cancer Father     lung  . Colon cancer Neg Hx   . Rectal cancer Neg Hx   . Stomach cancer Neg Hx   . Pancreatic cancer Neg Hx     No Known Allergies  Current Outpatient Prescriptions on File Prior to Visit  Medication Sig Dispense Refill  . aspirin 81 MG tablet Take 81 mg by mouth daily with breakfast.       .  lisinopril-hydrochlorothiazide (PRINZIDE,ZESTORETIC) 10-12.5 MG per tablet TAKE 1 TABLET EVERY MORNING  100 tablet  3  . Multiple Vitamin (MULTIVITAMIN) tablet Take 1 tablet by mouth daily with breakfast.        No current facility-administered medications on file prior to visit.    BP 130/76  Pulse 88  Temp(Src) 97.7 F (36.5 C) (Oral)  Resp 20  Wt 200 lb (90.719 kg)  SpO2 98%       Review of Systems  Constitutional: Positive for fever, activity change, appetite change and fatigue. Negative for chills.  HENT: Negative for congestion, dental problem, ear pain, hearing loss, sore throat, tinnitus, trouble swallowing and voice change.   Eyes: Negative for pain, discharge and visual disturbance.  Respiratory: Negative for cough, chest tightness, wheezing and stridor.   Cardiovascular: Negative for chest pain, palpitations and leg swelling.  Gastrointestinal: Positive for diarrhea. Negative for nausea, vomiting, abdominal pain, constipation, blood in stool and abdominal distention.  Genitourinary: Positive for dysuria. Negative for urgency, hematuria, flank pain, discharge, difficulty urinating and genital sores.  Musculoskeletal: Negative for arthralgias, back pain, gait problem, joint swelling, myalgias and neck  stiffness.  Skin: Negative for rash.  Neurological: Negative for dizziness, syncope, speech difficulty, weakness, numbness and headaches.  Hematological: Negative for adenopathy. Does not bruise/bleed easily.  Psychiatric/Behavioral: Negative for behavioral problems and dysphoric mood. The patient is not nervous/anxious.        Objective:   Physical Exam  Constitutional: He is oriented to person, place, and time. He appears well-developed.  HENT:  Head: Normocephalic.  Right Ear: External ear normal.  Left Ear: External ear normal.  Eyes: Conjunctivae and EOM are normal.  Neck: Normal range of motion.  Cardiovascular: Normal rate and normal heart sounds.    Pulmonary/Chest: Breath sounds normal.  Abdominal: Bowel sounds are normal.  Midline scar with incisional hernia  No CVA tenderness  Musculoskeletal: Normal range of motion. He exhibits no edema and no tenderness.  Neurological: He is alert and oriented to person, place, and time.  Psychiatric: He has a normal mood and affect. His behavior is normal.          Assessment & Plan:   Dysuria microscopic hematuria with mild pyuria. We'll treat with Cipro for 7 days. Hypertension  We'll sch and repeat UA at that timeedule for annual exam

## 2013-11-19 NOTE — Patient Instructions (Signed)
Drink as much fluid as you  can tolerate over the next few days  Take your antibiotic as prescribed until ALL of it is gone, but stop if you develop a rash, swelling, or any side effects of the medication.  Contact our office as soon as possible if  there are side effects of the medication.  Follow up with Dr. Tawanna Cooler in 3 months for your annual exam

## 2013-11-26 ENCOUNTER — Encounter (INDEPENDENT_AMBULATORY_CARE_PROVIDER_SITE_OTHER): Payer: Self-pay | Admitting: General Surgery

## 2013-11-26 ENCOUNTER — Ambulatory Visit (INDEPENDENT_AMBULATORY_CARE_PROVIDER_SITE_OTHER): Payer: 59 | Admitting: General Surgery

## 2013-11-26 VITALS — BP 130/76 | HR 88 | Temp 98.1°F | Resp 14 | Ht 72.0 in | Wt 198.0 lb

## 2013-11-26 DIAGNOSIS — K432 Incisional hernia without obstruction or gangrene: Secondary | ICD-10-CM

## 2013-11-26 NOTE — Patient Instructions (Signed)
Ventral Hernia Repair Ventral hernia repairis a surgery to fix a ventral hernia. Aventral hernia, also called an incisional hernia, is a bulge of body tissue or intestines that pushes through the front part of the abdomen. This can happen if the connective tissue covering the muscles over the abdomen has a weak spot or is torn because of a surgical cut (incision) from a previous surgery. Ventral hernia repair is often done soon after diagnosis to stop the hernia from getting bigger, becoming uncomfortable, or becoming an emergency. This surgery usually takes about 2 hours, but the time can vary greatly. LET YOUR CAREGIVER KNOW ABOUT:  Any allergies you have.  All medicines you are taking, including steroids, vitamins, herbs, eyedrops, and over-the-counter medicines and creams.  Previous problems you or members of your family have had with the use of anesthetics.  Any blood disorders or bleeding problems you have had.  Past surgeries you have had.  Other health problems you have. RISKS AND COMPLICATIONS  Generally, laparoscopic ventral hernia repair is a safe procedure. However, as with any surgical procedure, complications can occur. Possible complications include:  Bleeding.  Trouble passing urine or having a bowel movement after the operation.  Infection.  Pneumonia.  Blood clots.  Pain in the area of the hernia.  A bulge in the area of the hernia that may be caused by a collection of fluid.  Injury to intestines or other structures in the abdomen.  Return of the hernia after surgery.  BEFORE THE PROCEDURE   You may need to have blood tests, urine tests, a chest X-ray, or electrocardiography done before the day of the surgery.  Ask your caregiver about changing or stopping your regular medicines.  You may need to wash with a special type of germ-killing soap.  Do not eat or drink anything for at least 6 hours before the surgery.  Make plans to have someone drive you  home after the procedure. PROCEDURE   Small monitors will be put on your body. They are used to check your heart, blood pressure, and oxygen level.  An intravenous (IV) access tube will be put into a vein in your hand or arm. Fluids and medicine will flow directly into your body through the IV tube.  You will be given medicine to make you sleep through the procedure (general anesthetic).  Your abdomen will be cleaned with a special soap to kill any germs on your skin.  Once you are asleep, several small incisions will be made in your abdomen.  The large space in your abdomen will be filled with air so that it expands. This gives the caregiver more room and a better view.  A thin, lighted tube with a tiny camera on the end (laparoscope) is put through a small incision in your abdomen. The camera on the laparoscope sends a picture to a TV screen in the operating room. This gives the caregiver a good view inside the abdomen.  Hollow tubes are put through the other small incisions in your abdomen. The tools needed for the procedure are put through these tubes.  The caregiver puts the tissue or intestines that formed the hernia back in place. We will make an incision over the prior incision to close the muscle defect.    A screen-like patch (mesh) is used to close the hernia. This helps make the area stronger. Stitches, tacks, or staples are used to keep the mesh in place.  Medicine and a bandage (dressing) or skin glue  will be put over the incisions. AFTER THE PROCEDURE   You will stay in a recovery area until the anesthetic wears off. Your blood pressure and pulse will be checked often.  You may be able to go home the same day or may need to stay in the hospital for 1 or 2 days after this surgery. Your caregiver will decide when you can go home.  You may feel some pain. You will likely be given medicine for pain.  You will be urged to do breathing exercises that involve taking deep  breaths. This helps prevent a lung infection after a surgery.  You may have to wear compression stockings while you are in the hospital. These stockings help keep blood clots from forming in your legs.

## 2013-11-30 NOTE — Assessment & Plan Note (Signed)
Patient will investigate timing for ventral hernia repair with wife.  I think given his level of activity that he would benefit from a hybrid approach to repair this hernia. I would free up adhesions laparoscopically and then open to place the mesh and to close the muscle back together.  However, with the laparoscopic approach, the mesh can be fixed much more laterally than with open repair.  I reviewed the risk of surgery including bleeding, infection, damage to adjacent structures, hernia recurrence, prolonged pain.  He will schedule when he is able.

## 2013-11-30 NOTE — Progress Notes (Signed)
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 I saw him in January and discovered that he did have a hernia.  He was asymptomatic at that time, and was back at work and was not interested in getting it fixed at that time. He has started to develop some soreness and thinks the hernia is getting a bit larger.  He presents to discuss repair.   PERTINENT REVIEW OF SYSTEMS: Otherwise negative.     EXAM: Head: Normocephalic and atraumatic.  Eyes:  Conjunctivae are normal. Pupils are equal, round, and reactive to light. No scleral icterus.   Resp: No respiratory distress, normal effort. Abd:  Abdomen is soft, non distended and non tender. 5 cm reducible incisional hernia  There is no rebound and no guarding.  Neurological: Alert and oriented to person, place, and time. Coordination normal.  Skin: Skin is warm and dry. No rash noted. No diaphoretic. No erythema. No pallor.  Psychiatric: Normal mood and affect. Normal behavior. Judgment and thought content normal.      ASSESSMENT AND PLAN:   Incisional hernia Patient will investigate timing for ventral hernia repair with wife.  I think given his level of activity that he would benefit from a hybrid approach to repair this hernia. I would free up adhesions laparoscopically and then open to place the mesh and to close the muscle back together.  However, with the laparoscopic approach, the mesh can be fixed much more laterally than with open repair.  I reviewed the risk of surgery including bleeding, infection, damage to adjacent structures, hernia recurrence, prolonged pain.  He will schedule when he is able.     Maudry Diego, MD Surgical Oncology, General & Endocrine Surgery [facility]  Evette Georges, MD Roderick Pee, MD

## 2014-01-10 ENCOUNTER — Encounter (HOSPITAL_COMMUNITY): Payer: Self-pay | Admitting: Pharmacy Technician

## 2014-01-11 ENCOUNTER — Other Ambulatory Visit (HOSPITAL_COMMUNITY): Payer: Self-pay | Admitting: General Surgery

## 2014-01-11 NOTE — Patient Instructions (Addendum)
[place]  01/11/2014   Your procedure is scheduled on:  01/23/14  [facility]  Report to [facility] at  0600     AM.  Call this number if you have problems the morning of surgery: [phone]       Remember:   Do not eat food  Or drink :After Midnight. Tuesday NIGHT   Take these medicines the morning of surgery with A SIP OF WATER: NONE   .  Contacts, dentures or partial plates can not be worn to surgery  Leave suitcase in the car. After surgery it may be brought to your room.  For patients admitted to the hospital, checkout time is 11:00 AM day of  discharge.             SPECIAL INSTRUCTIONS- SEE [place] PREPARING FOR SURGERY INSTRUCTION SHEET-     DO NOT WEAR JEWELRY, LOTIONS, POWDERS, OR PERFUMES.  WOMEN-- DO NOT SHAVE LEGS OR UNDERARMS FOR 12 HOURS BEFORE SHOWERS. MEN MAY SHAVE FACE.  Patients discharged the day of surgery will not be allowed to drive home. IF going home the day of surgery, you must have a driver and someone to stay with you for the first 24 hours  Name and phone number of your driver:    Laurin Coder  [phone]                 [place]                                                  Patient Signature _____________________________

## 2014-01-14 ENCOUNTER — Encounter (HOSPITAL_COMMUNITY)
Admission: RE | Admit: 2014-01-14 | Discharge: 2014-01-14 | Disposition: A | Discharge status: Home / Self Care | Payer: 59 | Source: Ambulatory Visit | Attending: General Surgery | Admitting: General Surgery

## 2014-01-14 ENCOUNTER — Encounter (HOSPITAL_COMMUNITY): Payer: Self-pay

## 2014-01-14 ENCOUNTER — Ambulatory Visit (HOSPITAL_COMMUNITY)
Admission: RE | Admit: 2014-01-14 | Discharge: 2014-01-14 | Disposition: A | Discharge status: Home / Self Care | Payer: 59 | Source: Ambulatory Visit | Attending: General Surgery | Admitting: General Surgery

## 2014-01-14 DIAGNOSIS — Z01818 Encounter for other preprocedural examination: Secondary | ICD-10-CM | POA: Insufficient documentation

## 2014-01-14 DIAGNOSIS — K432 Incisional hernia without obstruction or gangrene: Secondary | ICD-10-CM | POA: Insufficient documentation

## 2014-01-14 DIAGNOSIS — Z01810 Encounter for preprocedural cardiovascular examination: Secondary | ICD-10-CM | POA: Insufficient documentation

## 2014-01-14 DIAGNOSIS — J342 Deviated nasal septum: Secondary | ICD-10-CM

## 2014-01-14 DIAGNOSIS — I10 Essential (primary) hypertension: Secondary | ICD-10-CM | POA: Insufficient documentation

## 2014-01-14 DIAGNOSIS — Z01812 Encounter for preprocedural laboratory examination: Secondary | ICD-10-CM | POA: Insufficient documentation

## 2014-01-14 HISTORY — DX: Deviated nasal septum: J34.2

## 2014-01-14 LAB — BASIC METABOLIC PANEL
BUN: 15 mg/dL (ref 6–23)
CHLORIDE: 103 meq/L (ref 96–112)
CO2: 27 mEq/L (ref 19–32)
CREATININE: 0.91 mg/dL (ref 0.50–1.35)
Calcium: 9.3 mg/dL (ref 8.4–10.5)
GFR calc non Af Amer: >90 mL/min (ref >90)
Glucose, Bld: 112 mg/dL — ABNORMAL HIGH (ref 70–99)
Potassium: 4.6 mEq/L (ref 3.7–5.3)
Sodium: 141 mEq/L (ref 137–147)

## 2014-01-14 LAB — CBC
HEMATOCRIT: 44.6 % (ref 39.0–52.0)
Hemoglobin: 15.6 g/dL (ref 13.0–17.0)
MCH: 31.3 pg (ref 26.0–34.0)
MCHC: 35 g/dL (ref 30.0–36.0)
MCV: 89.4 fL (ref 78.0–100.0)
Platelets: 224 10*3/uL (ref 150–400)
RBC: 4.99 MIL/uL (ref 4.22–5.81)
RDW: 13.8 % (ref 11.5–15.5)
WBC: 6 10*3/uL (ref 4.0–10.5)

## 2014-01-14 NOTE — Progress Notes (Signed)
01/14/14 0838  OBSTRUCTIVE SLEEP APNEA  Have you ever been diagnosed with sleep apnea through a sleep study? No  Do you snore loudly (loud enough to be heard through closed doors)?  1  Do you often feel tired, fatigued, or sleepy during the daytime? 0  Has anyone observed you stop breathing during your sleep? 0  Do you have, or are you being treated for high blood pressure? 1  BMI more than 35 kg/m2? 0  Age over 55 years old? 1  Neck circumference greater than 40 cm/18 inches? 0  Gender: 1  Obstructive Sleep Apnea Score 4  Score 4 or greater  Results sent to PCP

## 2014-01-14 NOTE — Progress Notes (Signed)
Per patient has missing left lower molar tooth

## 2014-01-22 NOTE — Anesthesia Preprocedure Evaluation (Addendum)
Anesthesia Evaluation  Patient identified by MRN, date of birth, ID band Patient awake    Reviewed: Allergy & Precautions, H&P , NPO status , Patient's Chart, lab work & pertinent test results, reviewed documented beta blocker date and time   History of Anesthesia Complications (+) DIFFICULT AIRWAY  Airway Mallampati: II TM Distance: >3 FB Neck ROM: Full  Mouth opening: Limited Mouth Opening  Dental  (+) Teeth Intact, Dental Advisory Given, Chipped and Missing,    Pulmonary former smoker,  breath sounds clear to auscultation        Cardiovascular hypertension, Pt. on medications Rhythm:Regular Rate:Normal  Denies cardiac symptoms   Neuro/Psych negative neurological ROS  negative psych ROS   GI/Hepatic Neg liver ROS, Villous adenoma   Endo/Other  negative endocrine ROS  Renal/GU negative Renal ROS  negative genitourinary   Musculoskeletal negative musculoskeletal ROS (+)   Abdominal   Peds  Hematology negative hematology ROS (+)   Anesthesia Other Findings Upper front veneer  Reproductive/Obstetrics                          Anesthesia Physical Anesthesia Plan  ASA: II  Anesthesia Plan: General   Post-op Pain Management:    Induction: Intravenous  Airway Management Planned: Oral ETT  Additional Equipment:   Intra-op Plan:   Post-operative Plan: Extubation in OR  Informed Consent: I have reviewed the patients History and Physical, chart, labs and discussed the procedure including the risks, benefits and alternatives for the proposed anesthesia with the patient or authorized representative who has indicated his/her understanding and acceptance.   Dental advisory given  Plan Discussed with: CRNA  Anesthesia Plan Comments:         Anesthesia Quick Evaluation

## 2014-01-23 ENCOUNTER — Observation Stay (HOSPITAL_COMMUNITY)
Admission: RE | Admit: 2014-01-23 | Discharge: 2014-01-25 | Disposition: A | Discharge status: Home / Self Care | Payer: 59 | Source: Ambulatory Visit | Attending: General Surgery | Admitting: General Surgery

## 2014-01-23 ENCOUNTER — Encounter (HOSPITAL_COMMUNITY): Payer: 59 | Admitting: Anesthesiology

## 2014-01-23 ENCOUNTER — Encounter (HOSPITAL_COMMUNITY)
Admission: RE | Disposition: A | Discharge status: Home / Self Care | Payer: Self-pay | Source: Ambulatory Visit | Attending: General Surgery

## 2014-01-23 ENCOUNTER — Encounter (HOSPITAL_COMMUNITY): Payer: Self-pay | Admitting: *Deleted

## 2014-01-23 ENCOUNTER — Ambulatory Visit (HOSPITAL_COMMUNITY): Payer: 59 | Admitting: Anesthesiology

## 2014-01-23 DIAGNOSIS — K432 Incisional hernia without obstruction or gangrene: Principal | ICD-10-CM | POA: Insufficient documentation

## 2014-01-23 DIAGNOSIS — K439 Ventral hernia without obstruction or gangrene: Secondary | ICD-10-CM | POA: Diagnosis present

## 2014-01-23 DIAGNOSIS — I10 Essential (primary) hypertension: Secondary | ICD-10-CM | POA: Insufficient documentation

## 2014-01-23 HISTORY — PX: VENTRAL HERNIA REPAIR: SHX424

## 2014-01-23 HISTORY — PX: INSERTION OF MESH: SHX5868

## 2014-01-23 LAB — CBC
HCT: 40.1 % (ref 39.0–52.0)
Hemoglobin: 14 g/dL (ref 13.0–17.0)
MCH: 31 pg (ref 26.0–34.0)
MCHC: 34.9 g/dL (ref 30.0–36.0)
MCV: 88.7 fL (ref 78.0–100.0)
PLATELETS: 212 10*3/uL (ref 150–400)
RBC: 4.52 MIL/uL (ref 4.22–5.81)
RDW: 13.7 % (ref 11.5–15.5)
WBC: 14.2 10*3/uL — ABNORMAL HIGH (ref 4.0–10.5)

## 2014-01-23 LAB — CREATININE, SERUM
CREATININE: 0.94 mg/dL (ref 0.50–1.35)
GFR calc Af Amer: >90 mL/min (ref >90)

## 2014-01-23 SURGERY — REPAIR, HERNIA, VENTRAL, LAPAROSCOPIC
Anesthesia: General | Site: Abdomen

## 2014-01-23 MED ORDER — HYDROCHLOROTHIAZIDE 12.5 MG PO CAPS
12.5000 mg | ORAL_CAPSULE | Freq: Every day | ORAL | Status: DC
  Administered 2014-01-23 – 2014-01-25 (×3): 12.5 mg via ORAL
  Filled 2014-01-23 (×3): qty 1

## 2014-01-23 MED ORDER — HYDROMORPHONE HCL PF 1 MG/ML IJ SOLN
INTRAMUSCULAR | Status: AC
  Filled 2014-01-23: qty 1

## 2014-01-23 MED ORDER — MIDAZOLAM HCL 2 MG/2ML IJ SOLN
INTRAMUSCULAR | Status: AC
  Filled 2014-01-23: qty 2

## 2014-01-23 MED ORDER — KCL IN DEXTROSE-NACL 20-5-0.45 MEQ/L-%-% IV SOLN
INTRAVENOUS | Status: AC
  Filled 2014-01-23: qty 1000

## 2014-01-23 MED ORDER — CEFAZOLIN SODIUM-DEXTROSE 2-3 GM-% IV SOLR
INTRAVENOUS | Status: AC
  Filled 2014-01-23: qty 50

## 2014-01-23 MED ORDER — PROPOFOL 10 MG/ML IV BOLUS
INTRAVENOUS | Status: DC | PRN
  Administered 2014-01-23: 150 mg via INTRAVENOUS

## 2014-01-23 MED ORDER — ONDANSETRON HCL 4 MG/2ML IJ SOLN
INTRAMUSCULAR | Status: DC | PRN
  Administered 2014-01-23: 4 mg via INTRAVENOUS

## 2014-01-23 MED ORDER — ROCURONIUM BROMIDE 100 MG/10ML IV SOLN
INTRAVENOUS | Status: AC
  Filled 2014-01-23: qty 1

## 2014-01-23 MED ORDER — KETOROLAC TROMETHAMINE 15 MG/ML IJ SOLN
15.0000 mg | Freq: Four times a day (QID) | INTRAMUSCULAR | Status: DC
  Administered 2014-01-23 – 2014-01-25 (×8): 15 mg via INTRAVENOUS
  Filled 2014-01-23 (×13): qty 1

## 2014-01-23 MED ORDER — ONDANSETRON HCL 4 MG/2ML IJ SOLN: 4.0000 mg | Freq: Four times a day (QID) | INTRAMUSCULAR | Status: DC | PRN

## 2014-01-23 MED ORDER — HYDROMORPHONE HCL PF 1 MG/ML IJ SOLN
0.2500 mg | INTRAMUSCULAR | Status: DC | PRN
  Administered 2014-01-23 (×4): 0.5 mg via INTRAVENOUS

## 2014-01-23 MED ORDER — GLYCOPYRROLATE 0.2 MG/ML IJ SOLN
INTRAMUSCULAR | Status: DC | PRN
  Administered 2014-01-23: 0.6 mg via INTRAVENOUS

## 2014-01-23 MED ORDER — ONDANSETRON HCL 4 MG PO TABS: 4.0000 mg | ORAL_TABLET | Freq: Four times a day (QID) | ORAL | Status: DC | PRN

## 2014-01-23 MED ORDER — LACTATED RINGERS IV SOLN: INTRAVENOUS | Status: DC

## 2014-01-23 MED ORDER — PROMETHAZINE HCL 25 MG/ML IJ SOLN: 6.2500 mg | INTRAMUSCULAR | Status: DC | PRN

## 2014-01-23 MED ORDER — LIDOCAINE HCL (PF) 1 % IJ SOLN
INTRAMUSCULAR | Status: DC | PRN
  Administered 2014-01-23: 2 mL via SUBCUTANEOUS

## 2014-01-23 MED ORDER — BUPIVACAINE-EPINEPHRINE 0.25% -1:200000 IJ SOLN
INTRAMUSCULAR | Status: DC | PRN
Start: 2014-01-23 — End: 2014-01-23
  Administered 2014-01-23: 2 mL

## 2014-01-23 MED ORDER — DEXAMETHASONE SODIUM PHOSPHATE 10 MG/ML IJ SOLN
INTRAMUSCULAR | Status: DC | PRN
  Administered 2014-01-23: 10 mg via INTRAVENOUS

## 2014-01-23 MED ORDER — ONDANSETRON HCL 4 MG/2ML IJ SOLN
INTRAMUSCULAR | Status: AC
  Filled 2014-01-23: qty 2

## 2014-01-23 MED ORDER — NEOSTIGMINE METHYLSULFATE 1 MG/ML IJ SOLN
INTRAMUSCULAR | Status: AC
  Filled 2014-01-23: qty 10

## 2014-01-23 MED ORDER — NEOSTIGMINE METHYLSULFATE 1 MG/ML IJ SOLN
INTRAMUSCULAR | Status: DC | PRN
  Administered 2014-01-23: 4 mg via INTRAVENOUS

## 2014-01-23 MED ORDER — CEFAZOLIN SODIUM 1-5 GM-% IV SOLN
1.0000 g | Freq: Four times a day (QID) | INTRAVENOUS | Status: AC
  Administered 2014-01-23 – 2014-01-24 (×3): 1 g via INTRAVENOUS
  Filled 2014-01-23 (×4): qty 50

## 2014-01-23 MED ORDER — DIPHENHYDRAMINE HCL 50 MG/ML IJ SOLN: 12.5000 mg | Freq: Four times a day (QID) | INTRAMUSCULAR | Status: DC | PRN

## 2014-01-23 MED ORDER — KCL IN DEXTROSE-NACL 20-5-0.45 MEQ/L-%-% IV SOLN
INTRAVENOUS | Status: DC
  Administered 2014-01-23 – 2014-01-25 (×4): via INTRAVENOUS
  Filled 2014-01-23 (×7): qty 1000

## 2014-01-23 MED ORDER — FENTANYL CITRATE 0.05 MG/ML IJ SOLN
INTRAMUSCULAR | Status: AC
  Filled 2014-01-23: qty 2

## 2014-01-23 MED ORDER — HEPARIN SODIUM (PORCINE) 5000 UNIT/ML IJ SOLN
5000.0000 [IU] | Freq: Three times a day (TID) | INTRAMUSCULAR | Status: DC
  Administered 2014-01-24 – 2014-01-25 (×4): 5000 [IU] via SUBCUTANEOUS
  Filled 2014-01-23 (×7): qty 1

## 2014-01-23 MED ORDER — PROPOFOL 10 MG/ML IV BOLUS
INTRAVENOUS | Status: AC
  Filled 2014-01-23: qty 20

## 2014-01-23 MED ORDER — ACETAMINOPHEN 500 MG PO TABS
1000.0000 mg | ORAL_TABLET | Freq: Four times a day (QID) | ORAL | Status: AC
  Administered 2014-01-23 – 2014-01-24 (×4): 1000 mg via ORAL
  Filled 2014-01-23 (×7): qty 2

## 2014-01-23 MED ORDER — LIDOCAINE HCL 1 % IJ SOLN
INTRAMUSCULAR | Status: AC
  Filled 2014-01-23: qty 20

## 2014-01-23 MED ORDER — 0.9 % SODIUM CHLORIDE (POUR BTL) OPTIME
TOPICAL | Status: DC | PRN
  Administered 2014-01-23: 1000 mL

## 2014-01-23 MED ORDER — KETOROLAC TROMETHAMINE 15 MG/ML IJ SOLN
INTRAMUSCULAR | Status: AC
  Filled 2014-01-23: qty 1

## 2014-01-23 MED ORDER — CEFAZOLIN SODIUM-DEXTROSE 2-3 GM-% IV SOLR
2.0000 g | INTRAVENOUS | Status: AC
  Administered 2014-01-23: 2 g via INTRAVENOUS

## 2014-01-23 MED ORDER — BUPIVACAINE-EPINEPHRINE PF 0.25-1:200000 % IJ SOLN
INTRAMUSCULAR | Status: AC
  Filled 2014-01-23: qty 30

## 2014-01-23 MED ORDER — HYDROMORPHONE HCL PF 1 MG/ML IJ SOLN
0.5000 mg | INTRAMUSCULAR | Status: DC | PRN
  Administered 2014-01-23: 0.5 mg via INTRAVENOUS
  Filled 2014-01-23: qty 1

## 2014-01-23 MED ORDER — LACTATED RINGERS IV SOLN
INTRAVENOUS | Status: DC | PRN
  Administered 2014-01-23: 08:00:00 via INTRAVENOUS

## 2014-01-23 MED ORDER — FENTANYL CITRATE 0.05 MG/ML IJ SOLN
INTRAMUSCULAR | Status: DC | PRN
  Administered 2014-01-23 (×2): 25 ug via INTRAVENOUS
  Administered 2014-01-23 (×4): 50 ug via INTRAVENOUS
  Administered 2014-01-23: 100 ug via INTRAVENOUS

## 2014-01-23 MED ORDER — METHOCARBAMOL 100 MG/ML IJ SOLN
500.0000 mg | Freq: Three times a day (TID) | INTRAVENOUS | Status: DC | PRN
  Filled 2014-01-23: qty 5

## 2014-01-23 MED ORDER — FENTANYL CITRATE 0.05 MG/ML IJ SOLN
INTRAMUSCULAR | Status: AC
  Filled 2014-01-23: qty 5

## 2014-01-23 MED ORDER — LISINOPRIL-HYDROCHLOROTHIAZIDE 10-12.5 MG PO TABS: 1.0000 | ORAL_TABLET | Freq: Every morning | ORAL | Status: DC

## 2014-01-23 MED ORDER — ROCURONIUM BROMIDE 100 MG/10ML IV SOLN
INTRAVENOUS | Status: DC | PRN
  Administered 2014-01-23: 30 mg via INTRAVENOUS
  Administered 2014-01-23: 10 mg via INTRAVENOUS
  Administered 2014-01-23: 5 mg via INTRAVENOUS

## 2014-01-23 MED ORDER — MIDAZOLAM HCL 5 MG/5ML IJ SOLN
INTRAMUSCULAR | Status: DC | PRN
  Administered 2014-01-23: 2 mg via INTRAVENOUS

## 2014-01-23 MED ORDER — OXYCODONE-ACETAMINOPHEN 5-325 MG PO TABS: 1.0000 | ORAL_TABLET | ORAL | Status: DC | PRN

## 2014-01-23 MED ORDER — SUCCINYLCHOLINE CHLORIDE 20 MG/ML IJ SOLN
INTRAMUSCULAR | Status: DC | PRN
  Administered 2014-01-23: 100 mg via INTRAVENOUS

## 2014-01-23 MED ORDER — DEXAMETHASONE SODIUM PHOSPHATE 10 MG/ML IJ SOLN
INTRAMUSCULAR | Status: AC
  Filled 2014-01-23: qty 1

## 2014-01-23 MED ORDER — GLYCOPYRROLATE 0.2 MG/ML IJ SOLN
INTRAMUSCULAR | Status: AC
  Filled 2014-01-23: qty 3

## 2014-01-23 MED ORDER — LISINOPRIL 10 MG PO TABS
10.0000 mg | ORAL_TABLET | Freq: Every day | ORAL | Status: DC
  Administered 2014-01-23 – 2014-01-25 (×3): 10 mg via ORAL
  Filled 2014-01-23 (×3): qty 1

## 2014-01-23 SURGICAL SUPPLY — 53 items
ADH SKN CLS APL DERMABOND .7 (GAUZE/BANDAGES/DRESSINGS) ×1
APL SKNCLS STERI-STRIP NONHPOA (GAUZE/BANDAGES/DRESSINGS)
BANDAGE ADH SHEER 1  50/CT (GAUZE/BANDAGES/DRESSINGS) ×4 IMPLANT
BENZOIN TINCTURE PRP APPL 2/3 (GAUZE/BANDAGES/DRESSINGS) ×1 IMPLANT
BINDER ABD UNIV 12 45-62 (WOUND CARE) IMPLANT
BINDER ABDOMINAL 46IN 62IN (WOUND CARE) ×2
BLADE HEX COATED 2.75 (ELECTRODE) ×1 IMPLANT
CABLE HIGH FREQUENCY MONO STRZ (ELECTRODE) ×1 IMPLANT
CANISTER SUCTION 2500CC (MISCELLANEOUS) ×2 IMPLANT
DECANTER SPIKE VIAL GLASS SM (MISCELLANEOUS) ×2 IMPLANT
DERMABOND ADVANCED (GAUZE/BANDAGES/DRESSINGS) ×1
DERMABOND ADVANCED .7 DNX12 (GAUZE/BANDAGES/DRESSINGS) IMPLANT
DEVICE PMI PUNCTURE CLOSURE (MISCELLANEOUS) ×2 IMPLANT
DEVICE SECURE STRAP 25 ABSORB (INSTRUMENTS) ×1 IMPLANT
DISSECTOR BLUNT TIP ENDO 5MM (MISCELLANEOUS) IMPLANT
DRAPE INCISE IOBAN 66X45 STRL (DRAPES) ×1 IMPLANT
DRAPE LAPAROSCOPIC ABDOMINAL (DRAPES) ×2 IMPLANT
DRSG TEGADERM 2-3/8X2-3/4 SM (GAUZE/BANDAGES/DRESSINGS) ×3 IMPLANT
ELECT REM PT RETURN 9FT ADLT (ELECTROSURGICAL) ×2
ELECTRODE REM PT RTRN 9FT ADLT (ELECTROSURGICAL) ×1 IMPLANT
GLOVE BIO SURGEON STRL SZ 6 (GLOVE) ×2 IMPLANT
GLOVE BIOGEL PI IND STRL 6.5 (GLOVE) ×1 IMPLANT
GLOVE BIOGEL PI IND STRL 7.0 (GLOVE) ×1 IMPLANT
GLOVE BIOGEL PI INDICATOR 6.5 (GLOVE) ×1
GLOVE BIOGEL PI INDICATOR 7.0 (GLOVE) ×1
GLOVE INDICATOR 6.5 STRL GRN (GLOVE) ×2 IMPLANT
GOWN STRL REUS W/TWL 2XL LVL3 (GOWN DISPOSABLE) ×2 IMPLANT
KIT BASIN OR (CUSTOM PROCEDURE TRAY) ×2 IMPLANT
MARKER SKIN DUAL TIP RULER LAB (MISCELLANEOUS) ×2 IMPLANT
MESH VENTRALIGHT ST 4X6IN (Mesh General) ×1 IMPLANT
NDL SPNL 22GX3.5 QUINCKE BK (NEEDLE) ×1 IMPLANT
NEEDLE SPNL 22GX3.5 QUINCKE BK (NEEDLE) ×2 IMPLANT
NS IRRIG 1000ML POUR BTL (IV SOLUTION) ×2 IMPLANT
PENCIL BUTTON HOLSTER BLD 10FT (ELECTRODE) ×1 IMPLANT
SCALPEL HARMONIC ACE (MISCELLANEOUS) ×1 IMPLANT
SCISSORS LAP 5X35 DISP (ENDOMECHANICALS) ×2 IMPLANT
SET IRRIG TUBING LAPAROSCOPIC (IRRIGATION / IRRIGATOR) IMPLANT
SOAP 2 % CHG 4 OZ (WOUND CARE) ×1 IMPLANT
SOLUTION ANTI FOG 6CC (MISCELLANEOUS) ×2 IMPLANT
SPONGE LAP 18X18 X RAY DECT (DISPOSABLE) ×2 IMPLANT
STRIP CLOSURE SKIN 1/2X4 (GAUZE/BANDAGES/DRESSINGS) ×1 IMPLANT
SUT MNCRL AB 4-0 PS2 18 (SUTURE) ×2 IMPLANT
SUT NOVA NAB DX-16 0-1 5-0 T12 (SUTURE) ×4 IMPLANT
SUT PROLENE 0 CT 1 CR/8 (SUTURE) ×1 IMPLANT
SUT VIC AB 3-0 SH 18 (SUTURE) ×1 IMPLANT
TOWEL OR 17X26 10 PK STRL BLUE (TOWEL DISPOSABLE) ×2 IMPLANT
TRAY FOLEY CATH 14FRSI W/METER (CATHETERS) ×1 IMPLANT
TRAY LAP CHOLE (CUSTOM PROCEDURE TRAY) ×2 IMPLANT
TROCAR BLADELESS OPT 5 75 (ENDOMECHANICALS) ×1 IMPLANT
TROCAR SLEEVE XCEL 5X75 (ENDOMECHANICALS) ×2 IMPLANT
TROCAR XCEL BLUNT TIP 100MML (ENDOMECHANICALS) IMPLANT
TROCAR XCEL UNIV SLVE 11M 100M (ENDOMECHANICALS) IMPLANT
TUBING INSUFFLATION 10FT LAP (TUBING) ×2 IMPLANT

## 2014-01-23 NOTE — Transfer of Care (Signed)
Immediate Anesthesia Transfer of Care Note  Patient: Jesse Arroyo  Procedure(s) Performed: Procedure(s): LAPAROSCOPIC VENTRAL HERNIA REPAIR (N/A) OPEN INSERTION OF MESH (N/A)  Patient Location: PACU  Anesthesia Type:General  Level of Consciousness: awake, alert , oriented and patient cooperative  Airway & Oxygen Therapy: Patient Spontanous Breathing and Patient connected to face mask oxygen  Post-op Assessment: Report given to PACU RN and Post -op Vital signs reviewed and stable  Post vital signs: Reviewed and stable  Complications: No apparent anesthesia complications

## 2014-01-23 NOTE — H&P (Signed)
  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 I saw him in January and discovered that he did have a hernia. He was asymptomatic at that time, and was back at work and was not interested in getting it fixed at that time. Hernia has gotten larger.   He has had no new symptoms.    PERTINENT REVIEW OF SYSTEMS:  Otherwise negative.   Wt Readings from Last 3 Encounters:  01/14/14 200 lb (90.719 kg)  11/26/13 198 lb (89.812 kg)  11/19/13 200 lb (90.719 kg)   Temp Readings from Last 3 Encounters:  01/23/14 97.9 F (36.6 C) Oral  01/23/14 97.9 F (36.6 C) Oral  01/14/14 97 F (36.1 C) Oral   BP Readings from Last 3 Encounters:  01/23/14 127/77  01/23/14 127/77  01/14/14 128/85   Pulse Readings from Last 3 Encounters:  01/23/14 68  01/23/14 68  01/14/14 79    EXAM:  Head: Normocephalic and atraumatic.  Eyes: Conjunctivae are normal. Pupils are equal, round, and reactive to light. No scleral icterus.  Resp: No respiratory distress, normal effort.  Abd: Abdomen is soft, non distended and non tender. 5 cm reducible incisional hernia above the umbilicus. There is no rebound and no guarding.  Neurological: Alert and oriented to person, place, and time. Coordination normal.  Skin: Skin is warm and dry. No rash noted. No diaphoretic. No erythema. No pallor.  Psychiatric: Normal mood and affect. Normal behavior. Judgment and thought content normal.   ASSESSMENT AND PLAN:  Incisional hernia  We will plan hybrid repair with lap LOA and open to place mesh.    I reviewed the risk of surgery including bleeding, infection, damage to adjacent structures, hernia recurrence, prolonged pain.     Milus Height, MD  Surgical Oncology, [facility]   Joycelyn Man, MD  Dorena Cookey, MD

## 2014-01-23 NOTE — Discharge Instructions (Signed)
[facility] _____[facility]  UMBILICAL OR INGUINAL HERNIA REPAIR: POST OP INSTRUCTIONS  Always review your discharge instruction sheet given to you by the facility where your surgery was performed. IF YOU HAVE DISABILITY OR FAMILY LEAVE FORMS, YOU MUST BRING THEM TO THE OFFICE FOR PROCESSING.   DO NOT GIVE THEM TO YOUR DOCTOR.  1. A  prescription for pain medication may be given to you upon discharge.  Take your pain medication as prescribed, if needed.  If narcotic pain medicine is not needed, then you may take acetaminophen (Tylenol) or ibuprofen (Advil) as needed. 2. Take your usually prescribed medications unless otherwise directed. 3. If you need a refill on your pain medication, please contact your pharmacy.  They will contact our office to request authorization. Prescriptions will not be filled after 5 pm or on week-ends. 4. You should follow a light diet the first 24 hours after arrival home, such as soup and crackers, etc.  Be sure to include lots of fluids daily.  Resume your normal diet the day after surgery. 5. Most patients will experience some swelling and bruising around the umbilicus or in the groin and scrotum.  Ice packs and reclining will help.  Swelling and bruising can take several days to resolve.  6. It is common to experience some constipation if taking pain medication after surgery.  Increasing fluid intake and taking a stool softener (such as Colace) will usually help or prevent this problem from occurring.  A mild laxative (Milk of Magnesia or Miralax) should be taken according to package directions if there are no bowel movements after 48 hours. 7. Unless discharge instructions indicate otherwise, you may remove your bandages 24-48 hours after surgery, and you may shower at that time.  You may have steri-strips (small skin tapes) in place directly over the incision.  These strips should be left on the skin for 7-10 days.  If your surgeon used skin glue on the  incision, you may shower in 24 hours.  The glue will flake off over the next 2-3 weeks.  Any sutures or staples will be removed at the office during your follow-up visit. 8. ACTIVITIES:  You may resume regular (light) daily activities beginning the next day--such as daily self-care, walking, climbing stairs--gradually increasing activities as tolerated.  You may have sexual intercourse when it is comfortable.  Refrain from any heavy lifting or straining until approved by your doctor. a. You may drive when you are no longer taking prescription pain medication, you can comfortably wear a seatbelt, and you can safely maneuver your car and apply brakes. b. RETURN TO WORK:  __________________________________________________________ 9. You should see your doctor in the office for a follow-up appointment approximately 2-3 weeks after your surgery.  Make sure that you call for this appointment within a day or two after you arrive home to insure a convenient appointment time. 10. OTHER INSTRUCTIONS:  __________________________________________________________________________________________________________________________________________________________________________________________  WHEN TO CALL YOUR DOCTOR: 1. Fever over 101.0 2. Inability to urinate 3. Nausea and/or vomiting 4. Extreme swelling or bruising 5. Continued bleeding from incision. 6. Increased pain, redness, or drainage from the incision  The clinic staff is available to answer your questions during regular business hours.  Please don't hesitate to call and ask to speak to one of the nurses for clinical concerns.  If you have a medical emergency, go to the nearest emergency room or call 911.  A surgeon from [facility] is always on call at the hospital     [address], [address], [place], [place]  [address] ?  [address] [address], [place], [place]   [address] [phone] ? [phone] ? FAX [phone] Web site:  [email or web address]  

## 2014-01-23 NOTE — Anesthesia Postprocedure Evaluation (Signed)
Anesthesia Post Note  Patient: Jesse Arroyo  Procedure(s) Performed: Procedure(s) (LRB): LAPAROSCOPIC VENTRAL HERNIA REPAIR (N/A) OPEN INSERTION OF MESH (N/A)  Anesthesia type: General  Patient location: PACU  Post pain: Pain level controlled  Post assessment: Post-op Vital signs reviewed  Last Vitals:  Filed Vitals:   01/23/14 1220  BP: 117/73  Pulse: 56  Temp: 36.5 C  Resp: 14    Post vital signs: Reviewed  Level of consciousness: sedated  Complications: No apparent anesthesia complications

## 2014-01-23 NOTE — Op Note (Signed)
Laparoscopic Assisted Ventral Hernia Repair with mesh  Indications: Symptomatic ventral hernia   Pre-operative Diagnosis: Reducible Incisional Ventral hernia   Post-operative Diagnosis: Same  Surgeon: [facility]   Assistants: none   Anesthesia: General endotracheal anesthesia and Local anesthesia 1% buffered lidocaine, 0.25.% bupivacaine, with epinephrine   ASA Class: 2  Procedure Details   The patient was seen in the Holding Room. The risks, benefits, complications, treatment options, and expected outcomes were discussed with the patient. The possibilities of reaction to medication, pulmonary aspiration, perforation of viscus, bleeding, recurrent infection, the need for additional procedures, failure to diagnose a condition, and creating a complication requiring transfusion or operation were discussed with the patient. The patient concurred with the proposed plan, giving informed consent. The site of surgery properly noted/marked. The patient was taken to the operating room, identified, and the procedure verified as ventral hernia repair. A Time Out was held and the above information confirmed.   The patient was placed supine. After establishing general anesthesia, the abdomen was prepped with Chloraprep and draped in sterile fashion. The patient was placed in to reverse trendelenberg position and rotated to the right. The abdomen was accessed with a 5 mm Optiview port without complication. Pneumoperitoneum was achieved to a pressure of 15 mm of mercury. 2 additional ports are placed on the left side of the abdomen after administration of local anesthetic.   The ventral hernia was seen and there was omentum adherent to the sac. This was pulled down with the locking graspers and cautery.  The colon was adherent to the right lateral abdominal wall.  This was gently grasped and scissors were used to take down filmy adhesions to the abdominal wall. The hernia defect was measured. This is  approximately 4x6 cm. The falciform ligament was taken down with the [facility] with cautery.    Ventralight ST 10.2x15.5 cm mesh was selected.  The midline incision was opened.  The mesh had 4 sutures placed at the 4 corners of the mesh.  The mesh was advanced into the abdomen.  The fascia was closed with interrupted #1 Novofil sutures.  Small incisions were made with the #11 blade and the suture passer was used to pull up the 4 sutures through the abdominal wall.  These were pulled tight and tied down.  The mesh was tacked down circumferentially with the SecureStrap tacker.  The mesh was examined and seen to have good lie.   The pneumoperitoneum was allowed to evacuate. This was done after making sure there was no pooling of blood in the abdomen. The skin incisions were closed with 4-0 Monocryl in subcuticular fashion. The wounds were then cleaned, dried, and dressed with Dermabond. The patient was awakened from anesthesia and taken to the [facility] in stable condition. Needle sponge and instrument counts were correct x2.   Findings:  Incarcerated omentum.    Estimated Blood Loss: Minimal   Drains: none   Complications: None; patient tolerated the procedure well.   Disposition: PACU - hemodynamically stable.   Condition: stable

## 2014-01-24 ENCOUNTER — Encounter (HOSPITAL_COMMUNITY): Payer: Self-pay | Admitting: General Surgery

## 2014-01-24 LAB — BASIC METABOLIC PANEL
BUN: 13 mg/dL (ref 6–23)
CO2: 26 meq/L (ref 19–32)
CREATININE: 0.88 mg/dL (ref 0.50–1.35)
Calcium: 8.6 mg/dL (ref 8.4–10.5)
Chloride: 101 mEq/L (ref 96–112)
GFR calc non Af Amer: >90 mL/min (ref >90)
Glucose, Bld: 145 mg/dL — ABNORMAL HIGH (ref 70–99)
Potassium: 4 mEq/L (ref 3.7–5.3)
SODIUM: 139 meq/L (ref 137–147)

## 2014-01-24 LAB — CBC
HCT: 39.8 % (ref 39.0–52.0)
Hemoglobin: 13.7 g/dL (ref 13.0–17.0)
MCH: 30.8 pg (ref 26.0–34.0)
MCHC: 34.4 g/dL (ref 30.0–36.0)
MCV: 89.4 fL (ref 78.0–100.0)
PLATELETS: 244 10*3/uL (ref 150–400)
RBC: 4.45 MIL/uL (ref 4.22–5.81)
RDW: 13.8 % (ref 11.5–15.5)
WBC: 15.9 10*3/uL — ABNORMAL HIGH (ref 4.0–10.5)

## 2014-01-24 LAB — GLUCOSE, CAPILLARY: GLUCOSE-CAPILLARY: 119 mg/dL — AB (ref 70–99)

## 2014-01-24 NOTE — Progress Notes (Signed)
1 Day Post-Op  Subjective: Pt sore, but pain reasonably controlled.    Objective: Vital signs in last 24 hours: Temp:  [97.6 F (36.4 C)-98 F (36.7 C)] 98 F (36.7 C) (01/29 1358) Pulse Rate:  [61-84] 84 (01/29 1358) Resp:  [16-18] 16 (01/29 1358) BP: (97-145)/(60-77) 145/76 mmHg (01/29 1358) SpO2:  [95 %-100 %] 97 % (01/29 1358) Last BM Date: 01/22/14  Intake/Output from previous day: 01/28 0701 - 01/29 0700 In: 2591.7 [P.O.:30; I.V.:2411.7; IV Piggyback:150] Out: 1000 [Urine:900; Blood:100] Intake/Output this shift: Total I/O In: 480 [P.O.:480] Out: -   General appearance: alert, cooperative and no distress Resp: breathing comfortably GI: soft, non distended, approp tender  Lab Results:   Recent Labs  01/23/14 1321 01/24/14 0438  WBC 14.2* 15.9*  HGB 14.0 13.7  HCT 40.1 39.8  PLT 212 244   BMET  Recent Labs  01/23/14 1321 01/24/14 0438  NA  --  139  K  --  4.0  CL  --  101  CO2  --  26  GLUCOSE  --  145*  BUN  --  13  CREATININE 0.94 0.88  CALCIUM  --  8.6   PT/INR No results found for this basename: LABPROT, INR,  in the last 72 hours ABG No results found for this basename: PHART, PCO2, PO2, HCO3,  in the last 72 hours  Studies/Results: No results found.  Anti-infectives: Anti-infectives   Start     Dose/Rate Route Frequency Ordered Stop   01/23/14 1400  ceFAZolin (ANCEF) IVPB 1 g/50 mL premix     1 g 100 mL/hr over 30 Minutes Intravenous Every 6 hours 01/23/14 1227 01/24/14 0227   01/23/14 0643  ceFAZolin (ANCEF) IVPB 2 g/50 mL premix     2 g 100 mL/hr over 30 Minutes Intravenous On call to O.R. 01/23/14 1696 01/23/14 0912      Assessment/Plan: s/p Procedure(s): LAPAROSCOPIC VENTRAL HERNIA REPAIR (N/A) OPEN INSERTION OF MESH (N/A) ADAT Heparin for dvt prophylaxis Ambulate. Await return of bowel function.    LOS: 1 day    [facility] 01/24/2014

## 2014-01-24 NOTE — Progress Notes (Signed)
UR completed 

## 2014-01-25 LAB — BASIC METABOLIC PANEL
BUN: 12 mg/dL (ref 6–23)
CHLORIDE: 105 meq/L (ref 96–112)
CO2: 25 meq/L (ref 19–32)
CREATININE: 0.93 mg/dL (ref 0.50–1.35)
Calcium: 8 mg/dL — ABNORMAL LOW (ref 8.4–10.5)
GFR calc non Af Amer: >90 mL/min (ref >90)
Glucose, Bld: 114 mg/dL — ABNORMAL HIGH (ref 70–99)
Potassium: 3.9 mEq/L (ref 3.7–5.3)
SODIUM: 140 meq/L (ref 137–147)

## 2014-01-25 LAB — CBC
HCT: 39.4 % (ref 39.0–52.0)
Hemoglobin: 13.5 g/dL (ref 13.0–17.0)
MCH: 30.7 pg (ref 26.0–34.0)
MCHC: 34.3 g/dL (ref 30.0–36.0)
MCV: 89.5 fL (ref 78.0–100.0)
Platelets: 226 10*3/uL (ref 150–400)
RBC: 4.4 MIL/uL (ref 4.22–5.81)
RDW: 14.2 % (ref 11.5–15.5)
WBC: 11.8 10*3/uL — AB (ref 4.0–10.5)

## 2014-01-25 MED ORDER — OXYCODONE-ACETAMINOPHEN 5-325 MG PO TABS: 1.0000 | ORAL_TABLET | ORAL | Status: DC | PRN

## 2014-01-25 NOTE — Progress Notes (Signed)
Patient ID: LABAN OROURKE, male   DOB: 25-Jul-1961, 53 y.o.   MRN: [id number] 2 Days Post-Op  Subjective: Pain tolerable.  Passing some gas.  No BM yet.    Objective: Vital signs in last 24 hours: Temp:  [98 F (36.7 C)-98.3 F (36.8 C)] 98.1 F (36.7 C) (01/30 0556) Pulse Rate:  [65-84] 83 (01/30 0556) Resp:  [16-18] 18 (01/30 0556) BP: (104-150)/(65-79) 138/79 mmHg (01/30 0556) SpO2:  [97 %-99 %] 97 % (01/30 0556) Last BM Date: 01/22/14  Intake/Output from previous day: 01/29 0701 - 01/30 0700 In: 3986.7 [P.O.:1200; I.V.:2786.7] Out: 350 [Urine:350] Intake/Output this shift: Total I/O In: 213.3 [I.V.:213.3] Out: -   General appearance: alert, cooperative and no distress Resp: breathing comfortably GI: soft, non distended, approp tender  Lab Results:   Recent Labs  01/24/14 0438 01/25/14 0450  WBC 15.9* 11.8*  HGB 13.7 13.5  HCT 39.8 39.4  PLT 244 226   BMET  Recent Labs  01/24/14 0438 01/25/14 0450  NA 139 140  K 4.0 3.9  CL 101 105  CO2 26 25  GLUCOSE 145* 114*  BUN 13 12  CREATININE 0.88 0.93  CALCIUM 8.6 8.0*   PT/INR No results found for this basename: LABPROT, INR,  in the last 72 hours ABG No results found for this basename: PHART, PCO2, PO2, HCO3,  in the last 72 hours  Studies/Results: No results found.  Anti-infectives: Anti-infectives   Start     Dose/Rate Route Frequency Ordered Stop   01/23/14 1400  ceFAZolin (ANCEF) IVPB 1 g/50 mL premix     1 g 100 mL/hr over 30 Minutes Intravenous Every 6 hours 01/23/14 1227 01/24/14 0227   01/23/14 0643  ceFAZolin (ANCEF) IVPB 2 g/50 mL premix     2 g 100 mL/hr over 30 Minutes Intravenous On call to O.R. 01/23/14 9826 01/23/14 0912      Assessment/Plan: s/p Procedure(s): LAPAROSCOPIC VENTRAL HERNIA REPAIR (N/A) OPEN INSERTION OF MESH (N/A) ADAT Heparin for dvt prophylaxis Ambulate. Regular diet. Saline lock ivf Home later today if tolerating diet.     LOS: 2 days     [facility] 01/25/2014

## 2014-01-25 NOTE — Discharge Summary (Signed)
Physician Discharge Summary  Patient ID: Jesse Arroyo MRN: [id number] DOB/AGE: [birth year] 53 y.o.  Admit date: 01/23/2014 Discharge date: 01/25/2014  Admission Diagnoses: Ventral incisional hernia  Discharge Diagnoses:  Active Problems:   Ventral hernia   Discharged Condition: stable  Hospital Course:  Pt was admitted to hospital following lap assisted ventral hernia repair.  He had reasonable pain control with PO meds.  He was able to pass gas and have BM.  He was able to advance diet.  He was ambulatory.  He was discharged to home on POD 2.  He was able to void spontaneously.    Consults: None  Significant Diagnostic Studies: labs: see epic.    Treatments: surgery: see above.  Discharge Exam: Blood pressure 124/76, pulse 71, temperature 98.3 F (36.8 C), temperature source Oral, resp. rate 16, height 6' (1.829 m), weight 200 lb (90.719 kg), SpO2 98.00%. General appearance: alert, cooperative and no distress Resp: breathing comfortably Cardio: regular rate and rhythm GI: soft, non distended.  incisions c/d/i.  Disposition: 01-Home or Self Care  Discharge Orders   Future Orders Complete By Expires   Call MD for:  persistant nausea and vomiting  As directed    Call MD for:  redness, tenderness, or signs of infection (pain, swelling, redness, odor or green/yellow discharge around incision site)  As directed    Call MD for:  severe uncontrolled pain  As directed    Call MD for:  temperature >100.4  As directed    Diet - low sodium heart healthy  As directed    Increase activity slowly  As directed        Medication List         aspirin 81 MG tablet  Take 81 mg by mouth daily with breakfast.     lisinopril-hydrochlorothiazide 10-12.5 MG per tablet  Commonly known as:  PRINZIDE,ZESTORETIC  Take 1 tablet by mouth every morning.     multivitamin tablet  Take 1 tablet by mouth daily with breakfast.     oxyCODONE-acetaminophen 5-325 MG per tablet  Commonly known as:   PERCOCET/ROXICET  Take 1-2 tablets by mouth every 4 (four) hours as needed for moderate pain.           Follow-up Information   Follow up with [facility], MD In 3 weeks.   Specialty:  General Surgery   Contact information:   [address] [place] [address] [phone]       Signed: Stark Klein 01/25/2014, 4:31 PM

## 2014-02-12 ENCOUNTER — Encounter (INDEPENDENT_AMBULATORY_CARE_PROVIDER_SITE_OTHER): Payer: Self-pay | Admitting: General Surgery

## 2014-02-12 ENCOUNTER — Ambulatory Visit (INDEPENDENT_AMBULATORY_CARE_PROVIDER_SITE_OTHER): Payer: 59 | Admitting: General Surgery

## 2014-02-12 ENCOUNTER — Encounter (INDEPENDENT_AMBULATORY_CARE_PROVIDER_SITE_OTHER): Payer: 59 | Admitting: General Surgery

## 2014-02-12 VITALS — BP 118/76 | HR 76 | Resp 14 | Ht 72.0 in | Wt 199.6 lb

## 2014-02-12 DIAGNOSIS — K432 Incisional hernia without obstruction or gangrene: Secondary | ICD-10-CM

## 2014-02-12 NOTE — Progress Notes (Signed)
HISTORY: Patient is doing well overall status post laparoscopic-assisted incisional hernia repair.  He is off pain medication. He is however still fairly sore, especially when bending. He also gets very tired, even if he just gets up to make a meal. He is now 3 weeks postop. He denies fevers chills nausea or vomiting. He has good bowel function.    EXAM: General:  Alert and oriented Incision:  Well healed.     PATHOLOGY: n/a   ASSESSMENT AND PLAN:   Incisional hernia Doing well overall but still very fatigued from surgery. He will need 8 weeks of work. I will see him back in 3 weeks to assess.      Milus Height, MD Surgical Oncology, [facility]  Joycelyn Man, MD Dorena Cookey, MD

## 2014-02-12 NOTE — Assessment & Plan Note (Signed)
Doing well overall but still very fatigued from surgery. He will need 8 weeks of work. I will see him back in 3 weeks to assess.

## 2014-02-12 NOTE — Patient Instructions (Signed)
Follow up in 3 weeks to assess readiness for work.

## 2014-03-04 ENCOUNTER — Encounter (INDEPENDENT_AMBULATORY_CARE_PROVIDER_SITE_OTHER): Payer: Self-pay

## 2014-03-04 ENCOUNTER — Ambulatory Visit (INDEPENDENT_AMBULATORY_CARE_PROVIDER_SITE_OTHER): Payer: 59 | Admitting: General Surgery

## 2014-03-04 ENCOUNTER — Encounter (INDEPENDENT_AMBULATORY_CARE_PROVIDER_SITE_OTHER): Payer: Self-pay | Admitting: General Surgery

## 2014-03-04 VITALS — BP 122/76 | HR 78 | Temp 97.8°F | Ht 72.0 in | Wt 193.0 lb

## 2014-03-04 DIAGNOSIS — K432 Incisional hernia without obstruction or gangrene: Secondary | ICD-10-CM

## 2014-03-04 NOTE — Progress Notes (Signed)
Patient ID: JI FELDNER, male   DOB: [birth year], 53 y.o.   MRN: [id number] Short term disability paperwork, including office note from 03/04/14, operative note from 01/23/14, and return to work note stating restrictions, all faxed to [facility] at [phone].  Pt is aware.

## 2014-03-04 NOTE — Patient Instructions (Signed)
Ok to go back to work 3/11 with restrictions.  No lifting over 30 pounds until 3/25.  Then can go to no restrictions.  Follow up with me as needed.    Colonoscopy per GI.

## 2014-03-04 NOTE — Assessment & Plan Note (Signed)
No evidence of surgical complications.  Follow up as needed.  GI to do colonoscopy as needed.

## 2014-03-04 NOTE — Progress Notes (Signed)
HISTORY: Pt is around 6 weeks s/p lap asst ventral hernia repair with mesh.  He is doing well.  Pain is improved.  He denies n/v/d/constipation.  He is not having fevers/ chills.  He denies wound drainage.  He is still a little tired, but this is improving.      EXAM: General:  Alert and oriented.   Incision:  Healing well.     PATHOLOGY: n/a   ASSESSMENT AND PLAN:   Incisional hernia No evidence of surgical complications.  Follow up as needed.  GI to do colonoscopy as needed.    OK to go back to work at 6 weeks with restrictions, then no restrictions at 8 weeks.      Milus Height, MD Surgical Oncology, [facility]  Joycelyn Man, MD Dorena Cookey, MD

## 2014-03-12 ENCOUNTER — Encounter: Payer: Self-pay | Admitting: Family Medicine

## 2014-03-12 ENCOUNTER — Ambulatory Visit (INDEPENDENT_AMBULATORY_CARE_PROVIDER_SITE_OTHER): Payer: 59 | Admitting: Family Medicine

## 2014-03-12 ENCOUNTER — Other Ambulatory Visit: Payer: Self-pay | Admitting: Family Medicine

## 2014-03-12 DIAGNOSIS — IMO0002 Reserved for concepts with insufficient information to code with codable children: Secondary | ICD-10-CM | POA: Insufficient documentation

## 2014-03-12 MED ORDER — TRAMADOL HCL 50 MG PO TABS: 50.0000 mg | ORAL_TABLET | Freq: Three times a day (TID) | ORAL | Status: DC | PRN

## 2014-03-12 NOTE — Patient Instructions (Signed)
Elevation and ice as much as possible  Knee immobilizer and crutches  Followup in 4 weeks  Motrin 6 400 mg twice daily with food  Tramadol 50 mg,,,,,,,,,, one half to one tablet up to 3 times daily. For pain

## 2014-03-12 NOTE — Progress Notes (Signed)
   Subjective:    Patient ID: Jesse Arroyo, male    DOB: [birth year], 53 y.o.   MRN: [id number]  HPI Zahid is a 53 year old married male nonsmoker who comes in today for evaluation of pain in his right knee for 1 week  He states he was home fell down the stairs and fell a pop in his right knee. It's been painful and swollen since that time. He's had no previous history of trauma to that knee.   Review of Systems Review of systems otherwise negative    Objective:   Physical Exam Well-developed well-nourished male no acute distress vital signs stable he is afebrile examination right knee shows some edema medially is also slightly warm. Ligaments are intact tenderness medial joint line       Assessment & Plan:  Torn cartilage medial right knee.......Marland Kitchen plan see orders

## 2014-03-12 NOTE — Progress Notes (Signed)
Pre visit review using our clinic review tool, if applicable. No additional management support is needed unless otherwise documented below in the visit note. 

## 2014-04-11 ENCOUNTER — Encounter: Payer: Self-pay | Admitting: Family Medicine

## 2014-04-11 ENCOUNTER — Ambulatory Visit (INDEPENDENT_AMBULATORY_CARE_PROVIDER_SITE_OTHER): Payer: 59 | Admitting: Family Medicine

## 2014-04-11 VITALS — BP 110/80 | Temp 98.4°F | Wt 201.0 lb

## 2014-04-11 DIAGNOSIS — N529 Male erectile dysfunction, unspecified: Secondary | ICD-10-CM

## 2014-04-11 DIAGNOSIS — IMO0002 Reserved for concepts with insufficient information to code with codable children: Secondary | ICD-10-CM

## 2014-04-11 MED ORDER — SILDENAFIL CITRATE 100 MG PO TABS: 50.0000 mg | ORAL_TABLET | Freq: Every day | ORAL | Status: DC | PRN

## 2014-04-11 NOTE — Progress Notes (Signed)
   Subjective:    Patient ID: Jesse Arroyo, male    DOB: 1961-10-03, 53 y.o.   MRN: [id number]  HPI  Chrsitopher is a 53 year old married male nonsmoker who comes in today for reevaluation of right knee pain  He twisted his knee at home coming down the stairs about a month ago. We saw him after that because he was having significant pain in the medial portion of his right knee. We felt like he had is partial tear the cartilage. We've been treating symptomatically with elevation ice and Motrin however he doesn't see much improvement.   Review of Systems Negative    Objective:   Physical Exam  Well-developed well-nourished male no acute distress vital signs stable is afebrile examination right knee shows slight swelling on inspection it's not warm or hot. Ligaments intact tenderness medial joint line      Assessment & Plan:  I still think is a tear neck cartilage however since not improved in 4 weeks with symptomatic therapy we'll begin workup start with x-ray

## 2014-04-11 NOTE — Patient Instructions (Signed)
Go to the main office tomorrow for x-rays of your knee  I will call you the report  In the meantime continue elevation ice splinting Motrin 600 mg twice daily with food

## 2014-04-11 NOTE — Progress Notes (Signed)
Pre visit review using our clinic review tool, if applicable. No additional management support is needed unless otherwise documented below in the visit note. 

## 2014-04-15 ENCOUNTER — Other Ambulatory Visit: Payer: Self-pay | Admitting: Family Medicine

## 2014-04-15 ENCOUNTER — Ambulatory Visit (INDEPENDENT_AMBULATORY_CARE_PROVIDER_SITE_OTHER)
Admission: RE | Admit: 2014-04-15 | Discharge: 2014-04-15 | Disposition: A | Discharge status: Home / Self Care | Payer: 59 | Source: Ambulatory Visit | Attending: Family Medicine | Admitting: Family Medicine

## 2014-04-15 DIAGNOSIS — IMO0002 Reserved for concepts with insufficient information to code with codable children: Secondary | ICD-10-CM

## 2014-04-17 ENCOUNTER — Other Ambulatory Visit: Payer: Self-pay | Admitting: Family Medicine

## 2014-04-17 DIAGNOSIS — M25561 Pain in right knee: Secondary | ICD-10-CM

## 2014-05-06 ENCOUNTER — Other Ambulatory Visit (HOSPITAL_COMMUNITY): Payer: Self-pay | Admitting: Orthopaedic Surgery

## 2014-05-06 DIAGNOSIS — M25561 Pain in right knee: Secondary | ICD-10-CM

## 2014-05-07 ENCOUNTER — Ambulatory Visit (HOSPITAL_COMMUNITY)
Admission: RE | Admit: 2014-05-07 | Discharge: 2014-05-07 | Disposition: A | Discharge status: Home / Self Care | Payer: 59 | Source: Ambulatory Visit | Attending: Orthopaedic Surgery | Admitting: Orthopaedic Surgery

## 2014-05-07 DIAGNOSIS — S99929A Unspecified injury of unspecified foot, initial encounter: Secondary | ICD-10-CM

## 2014-05-07 DIAGNOSIS — X500XXA Overexertion from strenuous movement or load, initial encounter: Secondary | ICD-10-CM | POA: Insufficient documentation

## 2014-05-07 DIAGNOSIS — S8990XA Unspecified injury of unspecified lower leg, initial encounter: Secondary | ICD-10-CM | POA: Insufficient documentation

## 2014-05-07 DIAGNOSIS — M25561 Pain in right knee: Secondary | ICD-10-CM

## 2014-05-07 DIAGNOSIS — M25569 Pain in unspecified knee: Secondary | ICD-10-CM | POA: Insufficient documentation

## 2014-05-07 DIAGNOSIS — S99919A Unspecified injury of unspecified ankle, initial encounter: Secondary | ICD-10-CM

## 2014-05-16 ENCOUNTER — Ambulatory Visit (HOSPITAL_COMMUNITY): Payer: 59

## 2014-05-29 ENCOUNTER — Ambulatory Visit (INDEPENDENT_AMBULATORY_CARE_PROVIDER_SITE_OTHER): Payer: 59 | Admitting: Family Medicine

## 2014-05-29 ENCOUNTER — Encounter: Payer: Self-pay | Admitting: Family Medicine

## 2014-05-29 VITALS — BP 110/80 | Temp 98.4°F | Wt 202.0 lb

## 2014-05-29 DIAGNOSIS — S0501XA Injury of conjunctiva and corneal abrasion without foreign body, right eye, initial encounter: Secondary | ICD-10-CM | POA: Insufficient documentation

## 2014-05-29 DIAGNOSIS — S058X9A Other injuries of unspecified eye and orbit, initial encounter: Secondary | ICD-10-CM

## 2014-05-29 NOTE — Patient Instructions (Signed)
Rest at home today  Ophthalmology consult if you don't seem improved by tomorrow or the pain gets worse

## 2014-05-29 NOTE — Progress Notes (Signed)
   Subjective:    Patient ID: REGGIE BISE, male    DOB: 1961-02-09, 53 y.o.   MRN: [id number]  HPI Dan is a 53 year old married male nonsmoker who comes in today suspecting he has a corneal abrasion  He was cleaning the girl last night with a wire brush and a piece of debris flunisolide. He thinks he got it out but he can feel a rough spot and is tearful and somewhat painful  Vision otherwise normal   Review of Systems    review of systems negative Objective:   Physical Exam  Well-developed well-nourished male in no acute distress in the sitting position the pupils equal reactive light and accommodation cornea is intact there is a slight abrasion at 12:00. No foreign body.      Assessment & Plan:  Corneal abrasion right eye plan bed rest at home tetracaine for acute numbing return when necessary. Ophthalmology consult within 24 hours if symptoms persist or pain gets worse

## 2014-05-29 NOTE — Progress Notes (Signed)
Pre visit review using our clinic review tool, if applicable. No additional management support is needed unless otherwise documented below in the visit note. 

## 2015-01-28 ENCOUNTER — Other Ambulatory Visit (INDEPENDENT_AMBULATORY_CARE_PROVIDER_SITE_OTHER): Payer: 59

## 2015-01-28 DIAGNOSIS — Z0000 Encounter for general adult medical examination without abnormal findings: Secondary | ICD-10-CM

## 2015-01-28 LAB — CBC WITH DIFFERENTIAL/PLATELET
BASOS ABS: 0 10*3/uL (ref 0.0–0.1)
BASOS PCT: 0.5 % (ref 0.0–3.0)
EOS PCT: 2.5 % (ref 0.0–5.0)
Eosinophils Absolute: 0.2 10*3/uL (ref 0.0–0.7)
HCT: 43.8 % (ref 39.0–52.0)
Hemoglobin: 15 g/dL (ref 13.0–17.0)
LYMPHS ABS: 2.3 10*3/uL (ref 0.7–4.0)
Lymphocytes Relative: 34 % (ref 12.0–46.0)
MCHC: 34.2 g/dL (ref 30.0–36.0)
MCV: 90.4 fl (ref 78.0–100.0)
MONO ABS: 0.5 10*3/uL (ref 0.1–1.0)
Monocytes Relative: 7.9 % (ref 3.0–12.0)
Neutro Abs: 3.7 10*3/uL (ref 1.4–7.7)
Neutrophils Relative %: 55.1 % (ref 43.0–77.0)
PLATELETS: 226 10*3/uL (ref 150.0–400.0)
RBC: 4.85 Mil/uL (ref 4.22–5.81)
RDW: 14.1 % (ref 11.5–15.5)
WBC: 6.7 10*3/uL (ref 4.0–10.5)

## 2015-01-28 LAB — LIPID PANEL
CHOL/HDL RATIO: 4
CHOLESTEROL: 181 mg/dL (ref 0–200)
HDL: 41.1 mg/dL (ref >39.00)
LDL CALC: 104 mg/dL — AB (ref 0–99)
NONHDL: 139.9
TRIGLYCERIDES: 179 mg/dL — AB (ref 0.0–149.0)
VLDL: 35.8 mg/dL (ref 0.0–40.0)

## 2015-01-28 LAB — HEPATIC FUNCTION PANEL
ALT: 19 U/L (ref 0–53)
AST: 16 U/L (ref 0–37)
Albumin: 4.1 g/dL (ref 3.5–5.2)
Alkaline Phosphatase: 40 U/L (ref 39–117)
Bilirubin, Direct: 0.1 mg/dL (ref 0.0–0.3)
TOTAL PROTEIN: 6.4 g/dL (ref 6.0–8.3)
Total Bilirubin: 0.4 mg/dL (ref 0.2–1.2)

## 2015-01-28 LAB — POCT URINALYSIS DIPSTICK
BILIRUBIN UA: NEGATIVE
GLUCOSE UA: NEGATIVE
Ketones, UA: NEGATIVE
LEUKOCYTES UA: NEGATIVE
Nitrite, UA: NEGATIVE
Protein, UA: NEGATIVE
RBC UA: NEGATIVE
Spec Grav, UA: <=1.005
Urobilinogen, UA: 0.2
pH, UA: 5.5

## 2015-01-28 LAB — BASIC METABOLIC PANEL
BUN: 20 mg/dL (ref 6–23)
CHLORIDE: 103 meq/L (ref 96–112)
CO2: 31 meq/L (ref 19–32)
CREATININE: 0.95 mg/dL (ref 0.40–1.50)
Calcium: 9.6 mg/dL (ref 8.4–10.5)
GFR: 87.24 mL/min (ref >60.00)
Glucose, Bld: 94 mg/dL (ref 70–99)
Potassium: 4.8 mEq/L (ref 3.5–5.1)
SODIUM: 138 meq/L (ref 135–145)

## 2015-01-28 LAB — TSH: TSH: 1.39 u[IU]/mL (ref 0.35–4.50)

## 2015-01-28 LAB — PSA: PSA: 0.51 ng/mL (ref 0.10–4.00)

## 2015-02-04 ENCOUNTER — Encounter: Payer: Self-pay | Admitting: Family Medicine

## 2015-02-04 ENCOUNTER — Other Ambulatory Visit: Payer: Self-pay | Admitting: *Deleted

## 2015-02-04 ENCOUNTER — Ambulatory Visit (INDEPENDENT_AMBULATORY_CARE_PROVIDER_SITE_OTHER): Payer: 59 | Admitting: Family Medicine

## 2015-02-04 VITALS — BP 120/80 | Ht 71.5 in | Wt 203.0 lb

## 2015-02-04 DIAGNOSIS — Z0000 Encounter for general adult medical examination without abnormal findings: Secondary | ICD-10-CM

## 2015-02-04 DIAGNOSIS — N529 Male erectile dysfunction, unspecified: Secondary | ICD-10-CM

## 2015-02-04 DIAGNOSIS — N528 Other male erectile dysfunction: Secondary | ICD-10-CM

## 2015-02-04 DIAGNOSIS — I10 Essential (primary) hypertension: Secondary | ICD-10-CM

## 2015-02-04 MED ORDER — AMBULATORY NON FORMULARY MEDICATION: Status: DC

## 2015-02-04 MED ORDER — SILDENAFIL CITRATE 100 MG PO TABS: 50.0000 mg | ORAL_TABLET | Freq: Every day | ORAL | Status: DC | PRN

## 2015-02-04 MED ORDER — LISINOPRIL-HYDROCHLOROTHIAZIDE 10-12.5 MG PO TABS: 1.0000 | ORAL_TABLET | Freq: Every morning | ORAL | Status: DC

## 2015-02-04 NOTE — Patient Instructions (Signed)
Continue current medications  Follow-up in one year sooner if any problems.................. the best priced on the Viagra is [email or web address]

## 2015-02-04 NOTE — Progress Notes (Signed)
Pre visit review using our clinic review tool, if applicable. No additional management support is needed unless otherwise documented below in the visit note. 

## 2015-02-04 NOTE — Progress Notes (Signed)
   Subjective:    Patient ID: Jesse Arroyo, male    DOB: March 17, 1961, 54 y.o.   MRN: [id number]  HPI  Jesse Arroyo is a 54 year old married male nonsmoker who comes in today for general physical examination  He takes Zestoretic 10-12.5 daily for hypertension BP 120/80  He uses Viagra 100 mg dose one half tab when necessary for ED  Does not get routine eye care recommended regular screening for glaucoma, he does get regular dental care. A colonoscopy 2014 was normal. He admits to chronic slow bowel movements. Outlined a bowel program  Vaccinations up-to-date   Review of Systems  Constitutional: Negative.   HENT: Negative.   Eyes: Negative.   Respiratory: Negative.   Cardiovascular: Negative.   Gastrointestinal: Negative.   Endocrine: Negative.   Genitourinary: Negative.   Musculoskeletal: Negative.   Skin: Negative.   Allergic/Immunologic: Negative.   Neurological: Negative.   Hematological: Negative.   Psychiatric/Behavioral: Negative.        Objective:   Physical Exam  Constitutional: He is oriented to person, place, and time. He appears well-developed and well-nourished.  HENT:  Head: Normocephalic and atraumatic.  Right Ear: External ear normal.  Left Ear: External ear normal.  Nose: Nose normal.  Mouth/Throat: Oropharynx is clear and moist.  Eyes: Conjunctivae and EOM are normal. Pupils are equal, round, and reactive to light.  Neck: Normal range of motion. Neck supple. No JVD present. No tracheal deviation present. No thyromegaly present.  Cardiovascular: Normal rate, regular rhythm, normal heart sounds and intact distal pulses.  Exam reveals no gallop and no friction rub.   No murmur heard. No carotid nor aortic bruits peripheral pulses 2+ and symmetrical  Pulmonary/Chest: Effort normal and breath sounds normal. No stridor. No respiratory distress. He has no wheezes. He has no rales. He exhibits no tenderness.  Abdominal: Soft. Bowel sounds are normal. He exhibits no  distension and no mass. There is no tenderness. There is no rebound and no guarding.  Genitourinary: Rectum normal, prostate normal and penis normal. Guaiac negative stool. No penile tenderness.  Musculoskeletal: Normal range of motion. He exhibits no edema or tenderness.  Lymphadenopathy:    He has no cervical adenopathy.  Neurological: He is alert and oriented to person, place, and time. He has normal reflexes. No cranial nerve deficit. He exhibits normal muscle tone.  Skin: Skin is warm and dry. No rash noted. No erythema. No pallor.  Total body skin exam normal except for scar midline abdomen had a ventral hernia repair year ago  Psychiatric: He has a normal mood and affect. His behavior is normal. Judgment and thought content normal.  Nursing note and vitals reviewed.         Assessment & Plan:  Healthy male  Hypertension ago continue current therapy  Mild ED refill Viagra

## 2015-02-07 ENCOUNTER — Other Ambulatory Visit: Payer: Self-pay | Admitting: Family Medicine

## 2015-02-10 ENCOUNTER — Telehealth: Payer: Self-pay | Admitting: Family Medicine

## 2015-02-10 NOTE — Telephone Encounter (Signed)
emmi emailed °

## 2016-02-03 ENCOUNTER — Encounter: Payer: Self-pay | Admitting: Internal Medicine

## 2016-02-03 ENCOUNTER — Ambulatory Visit (INDEPENDENT_AMBULATORY_CARE_PROVIDER_SITE_OTHER): Payer: 59 | Admitting: Internal Medicine

## 2016-02-03 VITALS — BP 140/86 | HR 94 | Temp 98.5°F | Resp 20 | Ht 71.5 in | Wt 202.0 lb

## 2016-02-03 DIAGNOSIS — Z0000 Encounter for general adult medical examination without abnormal findings: Secondary | ICD-10-CM

## 2016-02-03 DIAGNOSIS — R109 Unspecified abdominal pain: Secondary | ICD-10-CM | POA: Diagnosis not present

## 2016-02-03 DIAGNOSIS — I10 Essential (primary) hypertension: Secondary | ICD-10-CM

## 2016-02-03 DIAGNOSIS — K432 Incisional hernia without obstruction or gangrene: Secondary | ICD-10-CM

## 2016-02-03 DIAGNOSIS — R7989 Other specified abnormal findings of blood chemistry: Secondary | ICD-10-CM

## 2016-02-03 LAB — POC URINALSYSI DIPSTICK (AUTOMATED)
BILIRUBIN UA: NEGATIVE
Glucose, UA: NEGATIVE
Ketones, UA: NEGATIVE
Leukocytes, UA: NEGATIVE
NITRITE UA: NEGATIVE
Protein, UA: NEGATIVE
RBC UA: NEGATIVE
SPEC GRAV UA: 1.02
UROBILINOGEN UA: 1
pH, UA: 7

## 2016-02-03 NOTE — Progress Notes (Signed)
Subjective:    Patient ID: Jesse Arroyo, male    DOB: 12/10/1961, 55 y.o.   MRN: [id number]  HPI  55 year old patient with a long history of right lumbar/flank discomfort.  He has been receiving physical therapy without much benefit.  Pain is aggravated by twisting at the waist and heavy lifting.  He is quite concerned about kidney disease been the source of his pain. He has treated hypertension which has been stable.  Past Medical History  Diagnosis Date  . Allergy   . Hypertension   . Skin abnormalities     feet  . Deviated nasal septum 01/14/14    per pt    Social History   Social History  . Marital Status: Married    Spouse Name: N/A  . Number of Children: 2  . 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 Smoking status: Former Smoker    Quit date: 12/27/1993  . Smokeless tobacco: Former Systems developer    Types: Snuff    Quit date: 12/28/2007  . Alcohol Use: 2.4 oz/week    4 Glasses of wine per week  . Drug Use: No  . Sexual Activity: Not on file   Other Topics Concern  . Not on file   Social History Narrative    Past Surgical History  Procedure Laterality Date  . Wisdom tooth extraction    . Vasectomy    . Hemicolectomy  05/10/12  . Ventral hernia repair N/A 01/23/2014    Procedure: LAPAROSCOPIC VENTRAL HERNIA REPAIR;  Surgeon: Stark Klein, MD;  Location: [facility];  Service: General;  Laterality: N/A;  . Insertion of mesh N/A 01/23/2014    Procedure: OPEN INSERTION OF MESH;  Surgeon: Stark Klein, MD;  Location: [facility];  Service: General;  Laterality: N/A;    Family History  Problem Relation Age of Onset  . Hypertension Other   . Lung cancer Father 77  . Cancer Father     lung  . Colon cancer Neg Hx   . Rectal cancer Neg Hx   . Stomach cancer Neg Hx   . Pancreatic cancer Neg Hx     No Known Allergies  Current Outpatient Prescriptions on File Prior to Visit  Medication Sig Dispense Refill  .  AMBULATORY NON FORMULARY MEDICATION Medication Name: fluocinonide/salicyclic acid [id number] ointment [facility] [phone] 240 g 3  . aspirin 81 MG tablet Take 81 mg by mouth daily with breakfast.     . lisinopril-hydrochlorothiazide (PRINZIDE,ZESTORETIC) 10-12.5 MG per tablet Take 1 tablet by mouth every morning. 100 tablet 3  . Multiple Vitamin (MULTIVITAMIN) tablet Take 1 tablet by mouth daily with breakfast.     . sildenafil (VIAGRA) 100 MG tablet Take 0.5-1 tablets (50-100 mg total) by mouth daily as needed for erectile dysfunction. 10 tablet 11   No current facility-administered medications on file prior to visit.    BP 140/86 mmHg  Pulse 94  Temp(Src) 98.5 F (36.9 C) (Oral)  Resp 20  Ht 5' 11.5" (1.816 m)  Wt 202 lb (91.627 kg)  BMI 27.78 kg/m2  SpO2 98%    Review of Systems  Constitutional: Negative for fever, chills, appetite change and fatigue.  HENT: Negative for congestion, dental problem, ear pain, hearing loss, sore throat, tinnitus, trouble swallowing and voice change.   Eyes: Negative for pain, discharge and visual disturbance.  Respiratory: Negative for cough, chest tightness, wheezing and stridor.  Cardiovascular: Negative for chest pain, palpitations and leg swelling.  Gastrointestinal: Negative for nausea, vomiting, abdominal pain, diarrhea, constipation, blood in stool and abdominal distention.  Genitourinary: Negative for urgency, hematuria, flank pain, discharge, difficulty urinating and genital sores.  Musculoskeletal: Positive for back pain. Negative for myalgias, joint swelling, arthralgias, gait problem and neck stiffness.  Skin: Negative for rash.  Neurological: Negative for dizziness, syncope, speech difficulty, weakness, numbness and headaches.  Hematological: Negative for adenopathy. Does not bruise/bleed easily.  Psychiatric/Behavioral: Negative for behavioral problems and dysphoric mood. The patient is not nervous/anxious.          Objective:   Physical Exam  Constitutional: He is oriented to person, place, and time. He appears well-developed.  Blood pressure repeat 124 over 82  HENT:  Head: Normocephalic.  Right Ear: External ear normal.  Left Ear: External ear normal.  Eyes: Conjunctivae and EOM are normal.  Neck: Normal range of motion.  Cardiovascular: Normal rate and normal heart sounds.   Pulmonary/Chest: Breath sounds normal.  Abdominal: Bowel sounds are normal.  Clockwise twisting at the waist reproduced his right lumbar discomfort  Musculoskeletal: Normal range of motion. He exhibits no edema or tenderness.  Neurological: He is alert and oriented to person, place, and time.  Psychiatric: He has a normal mood and affect. His behavior is normal.          Assessment & Plan:   Right lumbar pain.  Will continue physical therapy.  He will consider massage therapy as well Essential hypertension  Patient is quite concerned about kidney disease.  Will schedule CPX but obtain CPX, labs today

## 2016-02-03 NOTE — Progress Notes (Signed)
Pre visit review using our clinic review tool, if applicable. No additional management support is needed unless otherwise documented below in the visit note. 

## 2016-02-03 NOTE — Patient Instructions (Addendum)
Annual clinical exam as scheduled  Report any new or worsening symptoms

## 2016-02-04 LAB — LIPID PANEL
CHOLESTEROL: 196 mg/dL (ref 0–200)
HDL: 38.9 mg/dL — ABNORMAL LOW (ref >39.00)
NonHDL: 157.06
TRIGLYCERIDES: 313 mg/dL — AB (ref 0.0–149.0)
Total CHOL/HDL Ratio: 5
VLDL: 62.6 mg/dL — ABNORMAL HIGH (ref 0.0–40.0)

## 2016-02-04 LAB — COMPREHENSIVE METABOLIC PANEL
ALBUMIN: 4.5 g/dL (ref 3.5–5.2)
ALK PHOS: 41 U/L (ref 39–117)
ALT: 21 U/L (ref 0–53)
AST: 16 U/L (ref 0–37)
BILIRUBIN TOTAL: 0.4 mg/dL (ref 0.2–1.2)
BUN: 18 mg/dL (ref 6–23)
CALCIUM: 9.9 mg/dL (ref 8.4–10.5)
CO2: 29 meq/L (ref 19–32)
CREATININE: 1 mg/dL (ref 0.40–1.50)
Chloride: 105 mEq/L (ref 96–112)
GFR: 81.93 mL/min (ref >60.00)
Glucose, Bld: 95 mg/dL (ref 70–99)
Potassium: 4.3 mEq/L (ref 3.5–5.1)
Sodium: 144 mEq/L (ref 135–145)
Total Protein: 7.2 g/dL (ref 6.0–8.3)

## 2016-02-04 LAB — CBC WITH DIFFERENTIAL/PLATELET
Basophils Absolute: 0.2 10*3/uL — ABNORMAL HIGH (ref 0.0–0.1)
Basophils Relative: 1.9 % (ref 0.0–3.0)
EOS ABS: 0.1 10*3/uL (ref 0.0–0.7)
Eosinophils Relative: 1 % (ref 0.0–5.0)
HEMATOCRIT: 48.3 % (ref 39.0–52.0)
HEMOGLOBIN: 16 g/dL (ref 13.0–17.0)
LYMPHS PCT: 30.8 % (ref 12.0–46.0)
Lymphs Abs: 3.1 10*3/uL (ref 0.7–4.0)
MCHC: 33.2 g/dL (ref 30.0–36.0)
MCV: 92.8 fl (ref 78.0–100.0)
MONOS PCT: 6.4 % (ref 3.0–12.0)
Monocytes Absolute: 0.6 10*3/uL (ref 0.1–1.0)
Neutro Abs: 6 10*3/uL (ref 1.4–7.7)
Neutrophils Relative %: 59.9 % (ref 43.0–77.0)
Platelets: 277 10*3/uL (ref 150.0–400.0)
RBC: 5.21 Mil/uL (ref 4.22–5.81)
RDW: 13.8 % (ref 11.5–15.5)
WBC: 10 10*3/uL (ref 4.0–10.5)

## 2016-02-04 LAB — TSH: TSH: 1.92 u[IU]/mL (ref 0.35–4.50)

## 2016-02-04 LAB — PSA: PSA: 0.5 ng/mL (ref 0.10–4.00)

## 2016-02-04 LAB — LDL CHOLESTEROL, DIRECT: LDL DIRECT: 110 mg/dL

## 2016-02-17 ENCOUNTER — Other Ambulatory Visit: Payer: Self-pay | Admitting: Family Medicine

## 2016-03-26 ENCOUNTER — Telehealth: Payer: Self-pay | Admitting: Family Medicine

## 2016-03-26 NOTE — Telephone Encounter (Signed)
Pt would like to know if Dr. Sherren Mocha could work him in for a physical before May.

## 2016-03-26 NOTE — Telephone Encounter (Signed)
Per Dr Sherren Mocha you can block 2 spots to schedule an appt for this pt.

## 2016-03-26 NOTE — Telephone Encounter (Signed)
Pt has been sch

## 2016-03-29 ENCOUNTER — Other Ambulatory Visit (INDEPENDENT_AMBULATORY_CARE_PROVIDER_SITE_OTHER): Payer: BLUE CROSS/BLUE SHIELD

## 2016-03-29 DIAGNOSIS — Z0000 Encounter for general adult medical examination without abnormal findings: Secondary | ICD-10-CM | POA: Diagnosis not present

## 2016-03-29 LAB — POC URINALSYSI DIPSTICK (AUTOMATED)
BILIRUBIN UA: NEGATIVE
Blood, UA: NEGATIVE
Glucose, UA: NEGATIVE
Ketones, UA: NEGATIVE
LEUKOCYTES UA: NEGATIVE
Nitrite, UA: NEGATIVE
PH UA: 7.5
Protein, UA: NEGATIVE
Spec Grav, UA: 1.02
UROBILINOGEN UA: 0.2

## 2016-03-29 LAB — CBC WITH DIFFERENTIAL/PLATELET
BASOS PCT: 0.3 % (ref 0.0–3.0)
Basophils Absolute: 0 10*3/uL (ref 0.0–0.1)
EOS ABS: 0 10*3/uL (ref 0.0–0.7)
EOS PCT: 0.8 % (ref 0.0–5.0)
HEMATOCRIT: 45.4 % (ref 39.0–52.0)
HEMOGLOBIN: 15.4 g/dL (ref 13.0–17.0)
Lymphocytes Relative: 27.8 % (ref 12.0–46.0)
Lymphs Abs: 1.7 10*3/uL (ref 0.7–4.0)
MCHC: 33.8 g/dL (ref 30.0–36.0)
MCV: 92 fl (ref 78.0–100.0)
Monocytes Absolute: 0.4 10*3/uL (ref 0.1–1.0)
Monocytes Relative: 6.9 % (ref 3.0–12.0)
NEUTROS ABS: 3.9 10*3/uL (ref 1.4–7.7)
Neutrophils Relative %: 64.2 % (ref 43.0–77.0)
PLATELETS: 266 10*3/uL (ref 150.0–400.0)
RBC: 4.93 Mil/uL (ref 4.22–5.81)
RDW: 14 % (ref 11.5–15.5)
WBC: 6.1 10*3/uL (ref 4.0–10.5)

## 2016-03-29 LAB — LIPID PANEL
Cholesterol: 177 mg/dL (ref 0–200)
HDL: 54.1 mg/dL (ref >39.00)
LDL CALC: 106 mg/dL — AB (ref 0–99)
NonHDL: 123.1
TRIGLYCERIDES: 85 mg/dL (ref 0.0–149.0)
Total CHOL/HDL Ratio: 3
VLDL: 17 mg/dL (ref 0.0–40.0)

## 2016-03-29 LAB — HEPATIC FUNCTION PANEL
ALT: 21 U/L (ref 0–53)
AST: 20 U/L (ref 0–37)
Albumin: 4.5 g/dL (ref 3.5–5.2)
Alkaline Phosphatase: 43 U/L (ref 39–117)
Bilirubin, Direct: 0.1 mg/dL (ref 0.0–0.3)
TOTAL PROTEIN: 7.1 g/dL (ref 6.0–8.3)
Total Bilirubin: 0.6 mg/dL (ref 0.2–1.2)

## 2016-03-29 LAB — BASIC METABOLIC PANEL
BUN: 17 mg/dL (ref 6–23)
CHLORIDE: 103 meq/L (ref 96–112)
CO2: 31 mEq/L (ref 19–32)
Calcium: 9.6 mg/dL (ref 8.4–10.5)
Creatinine, Ser: 0.88 mg/dL (ref 0.40–1.50)
GFR: 94.9 mL/min (ref >60.00)
Glucose, Bld: 105 mg/dL — ABNORMAL HIGH (ref 70–99)
Potassium: 4.8 mEq/L (ref 3.5–5.1)
SODIUM: 141 meq/L (ref 135–145)

## 2016-03-29 LAB — TSH: TSH: 0.48 u[IU]/mL (ref 0.35–4.50)

## 2016-03-29 LAB — PSA: PSA: 0.9 ng/mL (ref 0.10–4.00)

## 2016-04-05 ENCOUNTER — Encounter: Payer: Self-pay | Admitting: Family Medicine

## 2016-04-05 ENCOUNTER — Ambulatory Visit (INDEPENDENT_AMBULATORY_CARE_PROVIDER_SITE_OTHER): Payer: BLUE CROSS/BLUE SHIELD | Admitting: Family Medicine

## 2016-04-05 VITALS — BP 130/90 | Temp 98.4°F | Ht 71.0 in | Wt 198.0 lb

## 2016-04-05 DIAGNOSIS — I10 Essential (primary) hypertension: Secondary | ICD-10-CM | POA: Diagnosis not present

## 2016-04-05 DIAGNOSIS — N529 Male erectile dysfunction, unspecified: Secondary | ICD-10-CM | POA: Diagnosis not present

## 2016-04-05 DIAGNOSIS — Z0000 Encounter for general adult medical examination without abnormal findings: Secondary | ICD-10-CM

## 2016-04-05 MED ORDER — LISINOPRIL-HYDROCHLOROTHIAZIDE 10-12.5 MG PO TABS: 1.0000 | ORAL_TABLET | Freq: Every morning | ORAL | Status: DC

## 2016-04-05 MED ORDER — SILDENAFIL CITRATE 100 MG PO TABS: 50.0000 mg | ORAL_TABLET | Freq: Every day | ORAL | Status: DC | PRN

## 2016-04-05 MED ORDER — SILDENAFIL CITRATE 20 MG PO TABS: 20.0000 mg | ORAL_TABLET | Freq: Three times a day (TID) | ORAL | Status: DC

## 2016-04-05 NOTE — Patient Instructions (Signed)
Check your blood pressure daily in the morning for 2 weeks...Marland KitchenMarland KitchenMarland Kitchen BP goal 1 3585 or less....... if not at goal call and leave a voicemail with Apolonio Schneiders extension [phone] and well increase your medication  Continue your exercise and diet  Follow-up in one year sooner if any problems

## 2016-04-05 NOTE — Progress Notes (Signed)
Pre visit review using our clinic review tool, if applicable. No additional management support is needed unless otherwise documented below in the visit note. 

## 2016-04-05 NOTE — Progress Notes (Signed)
   Subjective:    Patient ID: Jesse Arroyo, male    DOB: 07-Sep-1961, 55 y.o.   MRN: [id number]  HPI Jesse Arroyo is a 55 year old married male nonsmoker who comes in today for general physical examination  In the past year he said a history of fatigue and low energy wonders about his testosterone level. He uses Viagra 50 mg for ED  He had a tick bite on his right thigh April 6. Advised to watch his calendar for 2 weeks and to report immediately if any fever chills.  He takes Zestoretic 10-12 0.5 daily for hypertension BP 130/90. He'll monitor his blood pressure at home in the morning for 2 weeks to be sure his blood pressure is at goal 135/85 or less. If not we will increase his Zestoretic 20-12 0.5  He gets routine eye care, dental care, colonoscopy 2014. If lesion at his cecum underwent a partial colectomy. Subsequent had a ventral hernia which was repaired with mesh 2015. He can exercise daily but he can't do a lot of sit ups with this abdominal repair.  Vaccinations up-to-date   Review of Systems  Constitutional: Negative.   HENT: Negative.   Eyes: Negative.   Respiratory: Negative.   Cardiovascular: Negative.   Gastrointestinal: Negative.   Endocrine: Negative.   Genitourinary: Negative.   Musculoskeletal: Negative.   Skin: Negative.   Allergic/Immunologic: Negative.   Neurological: Negative.   Hematological: Negative.   Psychiatric/Behavioral: Negative.        Objective:   Physical Exam  Constitutional: He is oriented to person, place, and time. He appears well-developed and well-nourished.  HENT:  Head: Normocephalic and atraumatic.  Right Ear: External ear normal.  Left Ear: External ear normal.  Nose: Nose normal.  Mouth/Throat: Oropharynx is clear and moist.  Eyes: Conjunctivae and EOM are normal. Pupils are equal, round, and reactive to light.  Neck: Normal range of motion. Neck supple. No JVD present. No tracheal deviation present. No thyromegaly present.    Cardiovascular: Normal rate, regular rhythm, normal heart sounds and intact distal pulses.  Exam reveals no gallop and no friction rub.   No murmur heard. Pulmonary/Chest: Effort normal and breath sounds normal. No stridor. No respiratory distress. He has no wheezes. He has no rales. He exhibits no tenderness.  Abdominal: Soft. Bowel sounds are normal. He exhibits no distension and no mass. There is no tenderness. There is no rebound and no guarding.  Genitourinary: Rectum normal, prostate normal and penis normal. Guaiac negative stool. No penile tenderness.  Musculoskeletal: Normal range of motion. He exhibits no edema or tenderness.  Lymphadenopathy:    He has no cervical adenopathy.  Neurological: He is alert and oriented to person, place, and time. He has normal reflexes. No cranial nerve deficit. He exhibits normal muscle tone.  Skin: Skin is warm and dry. No rash noted. No erythema. No pallor.  Total body skin exam normal except for scar midabdomen free of his ventral hernia repair  Psychiatric: He has a normal mood and affect. His behavior is normal. Judgment and thought content normal.  Nursing note and vitals reviewed.         Assessment & Plan:  Healthy male  Hypertension,,,,,, BP 130/90,,,,,,,,, goal 135/85 or less,,,,,,,, BP check every morning for 2 weeks call if not at goal  Status post partial colectomy in the right side lesion was benign at the ileocecal valve  Status post ventral hernia repair

## 2016-04-28 DIAGNOSIS — I340 Nonrheumatic mitral (valve) insufficiency: Secondary | ICD-10-CM | POA: Diagnosis not present

## 2016-05-03 ENCOUNTER — Telehealth: Payer: Self-pay | Admitting: Family Medicine

## 2016-05-03 MED ORDER — CIPROFLOXACIN HCL 500 MG PO TABS: 500.0000 mg | ORAL_TABLET | Freq: Two times a day (BID) | ORAL | Status: DC

## 2016-05-03 NOTE — Telephone Encounter (Signed)
Rx sent in and Left message on machine for patient.

## 2016-05-03 NOTE — Telephone Encounter (Signed)
Pt state that he is normally given antibiotic without a OV and pt is needing antibiotic for blood in his urine.

## 2016-05-27 DIAGNOSIS — Z9889 Other specified postprocedural states: Secondary | ICD-10-CM | POA: Diagnosis not present

## 2016-06-03 DIAGNOSIS — Z9889 Other specified postprocedural states: Secondary | ICD-10-CM | POA: Diagnosis not present

## 2016-06-18 DIAGNOSIS — I119 Hypertensive heart disease without heart failure: Secondary | ICD-10-CM | POA: Diagnosis not present

## 2016-06-18 DIAGNOSIS — Z79899 Other long term (current) drug therapy: Secondary | ICD-10-CM | POA: Diagnosis not present

## 2016-06-18 DIAGNOSIS — R002 Palpitations: Secondary | ICD-10-CM | POA: Diagnosis not present

## 2016-06-18 DIAGNOSIS — Z9889 Other specified postprocedural states: Secondary | ICD-10-CM | POA: Diagnosis not present

## 2016-06-18 DIAGNOSIS — I340 Nonrheumatic mitral (valve) insufficiency: Secondary | ICD-10-CM | POA: Diagnosis not present

## 2016-07-02 DIAGNOSIS — Z79899 Other long term (current) drug therapy: Secondary | ICD-10-CM | POA: Diagnosis not present

## 2016-07-02 DIAGNOSIS — Z9889 Other specified postprocedural states: Secondary | ICD-10-CM | POA: Diagnosis not present

## 2016-07-06 DIAGNOSIS — Z954 Presence of other heart-valve replacement: Secondary | ICD-10-CM | POA: Diagnosis not present

## 2016-07-06 DIAGNOSIS — Z9889 Other specified postprocedural states: Secondary | ICD-10-CM | POA: Diagnosis not present

## 2016-07-12 DIAGNOSIS — Z9889 Other specified postprocedural states: Secondary | ICD-10-CM | POA: Diagnosis not present

## 2016-07-12 DIAGNOSIS — Z954 Presence of other heart-valve replacement: Secondary | ICD-10-CM | POA: Diagnosis not present

## 2016-07-16 DIAGNOSIS — Z954 Presence of other heart-valve replacement: Secondary | ICD-10-CM | POA: Diagnosis not present

## 2016-07-16 DIAGNOSIS — Z9889 Other specified postprocedural states: Secondary | ICD-10-CM | POA: Diagnosis not present

## 2016-07-19 ENCOUNTER — Encounter: Payer: Self-pay | Admitting: Gastroenterology

## 2016-07-19 DIAGNOSIS — Z954 Presence of other heart-valve replacement: Secondary | ICD-10-CM | POA: Diagnosis not present

## 2016-07-19 DIAGNOSIS — Z9889 Other specified postprocedural states: Secondary | ICD-10-CM | POA: Diagnosis not present

## 2016-07-21 DIAGNOSIS — Z9889 Other specified postprocedural states: Secondary | ICD-10-CM | POA: Diagnosis not present

## 2016-07-21 DIAGNOSIS — Z954 Presence of other heart-valve replacement: Secondary | ICD-10-CM | POA: Diagnosis not present

## 2016-07-23 DIAGNOSIS — Z9889 Other specified postprocedural states: Secondary | ICD-10-CM | POA: Diagnosis not present

## 2016-07-23 DIAGNOSIS — Z954 Presence of other heart-valve replacement: Secondary | ICD-10-CM | POA: Diagnosis not present

## 2016-07-26 DIAGNOSIS — Z954 Presence of other heart-valve replacement: Secondary | ICD-10-CM | POA: Diagnosis not present

## 2016-07-26 DIAGNOSIS — Z9889 Other specified postprocedural states: Secondary | ICD-10-CM | POA: Diagnosis not present

## 2016-08-07 DIAGNOSIS — J019 Acute sinusitis, unspecified: Secondary | ICD-10-CM | POA: Diagnosis not present

## 2016-08-07 DIAGNOSIS — B9689 Other specified bacterial agents as the cause of diseases classified elsewhere: Secondary | ICD-10-CM | POA: Diagnosis not present

## 2016-09-21 ENCOUNTER — Encounter: Payer: Self-pay | Admitting: Family Medicine

## 2016-09-21 ENCOUNTER — Ambulatory Visit (INDEPENDENT_AMBULATORY_CARE_PROVIDER_SITE_OTHER): Payer: BLUE CROSS/BLUE SHIELD | Admitting: Family Medicine

## 2016-09-21 VITALS — BP 138/86 | HR 72 | Temp 97.9°F | Wt 186.8 lb

## 2016-09-21 DIAGNOSIS — I10 Essential (primary) hypertension: Secondary | ICD-10-CM | POA: Diagnosis not present

## 2016-09-21 LAB — BASIC METABOLIC PANEL
BUN: 15 mg/dL (ref 6–23)
CO2: 33 mEq/L — ABNORMAL HIGH (ref 19–32)
Calcium: 9.2 mg/dL (ref 8.4–10.5)
Chloride: 102 mEq/L (ref 96–112)
Creatinine, Ser: 0.96 mg/dL (ref 0.40–1.50)
GFR: 85.69 mL/min (ref >60.00)
Glucose, Bld: 84 mg/dL (ref 70–99)
POTASSIUM: 4.7 meq/L (ref 3.5–5.1)
SODIUM: 141 meq/L (ref 135–145)

## 2016-09-21 MED ORDER — LISINOPRIL-HYDROCHLOROTHIAZIDE 20-12.5 MG PO TABS: 1.0000 | ORAL_TABLET | Freq: Every day | ORAL | 3 refills | Status: DC

## 2016-09-21 NOTE — Progress Notes (Signed)
Jesse Arroyo is a 55 year old married male nonsmoker who comes in today for reevaluation of hypertension  He states she's noted over the last couple weeks from 3 to 6 PM the afternoon his face gets flushed and red. He feels his blood pressures going up. He's on lisinopril 10-12 0.5 daily for hypertension. BP today 140/80 right arm sitting position. Repeat by me same with his digital cough. He's been compliant with his daily medications  Review of systems negative except for recent onset of leg cramps.  Physical examination vital signs stable set for BP 140/80 right arm sitting position repeat 2 same  Impression  #1 hypertension not at goal,,,,,,,,,,, increase lisinopril to 20 mg daily check potassium level

## 2016-09-21 NOTE — Patient Instructions (Signed)
Zestoretic 20-12 0.5...........Marland Kitchen 1 daily in the morning  Check your blood pressure in the morning late afternoon and at bedtime daily for the next 4-6 weeks.  BP goal 135/85 or less.  If your blood pressure is not at goal return for reevaluation or she have any questions call and leave a voicemail such with [place] extension [phone]

## 2016-09-21 NOTE — Progress Notes (Signed)
Pre visit review using our clinic review tool, if applicable. No additional management support is needed unless otherwise documented below in the visit note. 

## 2016-09-23 ENCOUNTER — Telehealth: Payer: Self-pay | Admitting: *Deleted

## 2016-09-23 NOTE — Telephone Encounter (Signed)
-----   Message from Dorena Cookey, MD sent at 09/22/2016 11:05 AM EDT ----- Please call labs normal

## 2016-09-24 NOTE — Telephone Encounter (Signed)
Called and informed pt of lab results. Pt verbalized understanding.

## 2016-11-03 ENCOUNTER — Encounter: Payer: Self-pay | Admitting: Gastroenterology

## 2016-12-06 ENCOUNTER — Ambulatory Visit (AMBULATORY_SURGERY_CENTER): Payer: BLUE CROSS/BLUE SHIELD

## 2016-12-06 VITALS — Ht 72.0 in | Wt 187.0 lb

## 2016-12-06 DIAGNOSIS — Z860100 Personal history of colon polyps, unspecified: Secondary | ICD-10-CM

## 2016-12-06 MED ORDER — SUPREP BOWEL PREP KIT 17.5-3.13-1.6 GM/177ML PO SOLN: 1.0000 | Freq: Once | ORAL | 0 refills | Status: AC

## 2016-12-06 NOTE — Progress Notes (Signed)
No allergies to eggs or soy No past problems with anesthesia No home oxygen No diet meds  Declined emmi 

## 2016-12-07 ENCOUNTER — Encounter: Payer: Self-pay | Admitting: Gastroenterology

## 2016-12-17 ENCOUNTER — Ambulatory Visit (AMBULATORY_SURGERY_CENTER): Payer: BLUE CROSS/BLUE SHIELD | Admitting: Gastroenterology

## 2016-12-17 ENCOUNTER — Encounter: Payer: Self-pay | Admitting: Gastroenterology

## 2016-12-17 VITALS — BP 118/73 | HR 79 | Temp 98.8°F | Resp 13 | Ht 72.0 in | Wt 187.0 lb

## 2016-12-17 DIAGNOSIS — Z86010 Personal history of colonic polyps: Secondary | ICD-10-CM | POA: Diagnosis present

## 2016-12-17 DIAGNOSIS — D123 Benign neoplasm of transverse colon: Secondary | ICD-10-CM | POA: Diagnosis not present

## 2016-12-17 DIAGNOSIS — Z1211 Encounter for screening for malignant neoplasm of colon: Secondary | ICD-10-CM | POA: Diagnosis not present

## 2016-12-17 MED ORDER — SODIUM CHLORIDE 0.9 % IV SOLN: 500.0000 mL | INTRAVENOUS | Status: DC

## 2016-12-17 NOTE — Op Note (Signed)
[facility] Patient Name: Jesse Arroyo Procedure Date: 12/17/2016 3:37 PM MRN: [id number] Endoscopist: Mauri Pole , MD Age: 55 Referring MD:  Date of Birth: 1961/08/13 Gender: Male Account #: [id number] Procedure:                Colonoscopy Indications:              High risk colon cancer surveillance: Personal                            history of colonic polyps. 2x3 cecal polyp s/p                            limited colonic resection and ileocolonic                            anastamosis Medicines:                Monitored Anesthesia Care Procedure:                Pre-Anesthesia Assessment:                           - Prior to the procedure, a History and Physical                            was performed, and patient medications and                            allergies were reviewed. The patient's tolerance of                            previous anesthesia was also reviewed. The risks                            and benefits of the procedure and the sedation                            options and risks were discussed with the patient.                            All questions were answered, and informed consent                            was obtained. Prior Anticoagulants: The patient has                            taken no previous anticoagulant or antiplatelet                            agents. ASA Grade Assessment: II - A patient with                            mild systemic disease. After reviewing the risks  and benefits, the patient was deemed in                            satisfactory condition to undergo the procedure.                           After obtaining informed consent, the colonoscope                            was passed under direct vision. Throughout the                            procedure, the patient's blood pressure, pulse, and                            oxygen saturations were monitored continuously. The                  Model CF-HQ190L [id number]) scope was introduced                            through the anus and advanced to the the                            ileocolonic anastomosis. The colonoscopy was                            performed without difficulty. The patient tolerated                            the procedure well. The quality of the bowel                            preparation was excellent. The terminal ileum, the                            appendiceal orifice and the rectum were                            photographed. Scope In: 3:44:00 PM Scope Out: 3:54:53 PM Scope Withdrawal Time: 0 hours 8 minutes 3 seconds  Total Procedure Duration: 0 hours 10 minutes 53 seconds  Findings:                 The perianal and digital rectal examinations were                            normal.                           A 3 mm polyp was found in the transverse colon. The                            polyp was sessile. The polyp was removed with a  cold biopsy forceps. Resection and retrieval were                            complete.                           Non-bleeding internal hemorrhoids were found during                            retroflexion. The hemorrhoids were small. Complications:            No immediate complications. Estimated Blood Loss:     Estimated blood loss was minimal. Impression:               - One 3 mm polyp in the transverse colon, removed                            with a cold biopsy forceps. Resected and retrieved.                           - Non-bleeding internal hemorrhoids. Recommendation:           - Patient has a contact number available for                            emergencies. The signs and symptoms of potential                            delayed complications were discussed with the                            patient. Return to normal activities tomorrow.                            Written discharge instructions were provided to  the                            patient.                           - Resume previous diet.                           - Continue present medications.                           - Await pathology results.                           - Repeat colonoscopy in 5 years for surveillance. Mauri Pole, MD 12/17/2016 4:08:19 PM This report has been signed electronically.

## 2016-12-17 NOTE — Patient Instructions (Signed)
Impression/Recommendations:  Polyp handout given to patient. Hemorrhoid handout given to patient.  Repeat colonoscopy in 5 years for surveillance.  YOU HAD AN ENDOSCOPIC PROCEDURE TODAY AT THE [facility]:   Refer to the procedure report that was given to you for any specific questions about what was found during the examination.  If the procedure report does not answer your questions, please call your gastroenterologist to clarify.  If you requested that your care partner not be given the details of your procedure findings, then the procedure report has been included in a sealed envelope for you to review at your convenience later.  YOU SHOULD EXPECT: Some feelings of bloating in the abdomen. Passage of more gas than usual.  Walking can help get rid of the air that was put into your GI tract during the procedure and reduce the bloating. If you had a lower endoscopy (such as a colonoscopy or flexible sigmoidoscopy) you may notice spotting of blood in your stool or on the toilet paper. If you underwent a bowel prep for your procedure, you may not have a normal bowel movement for a few days.  Please Note:  You might notice some irritation and congestion in your nose or some drainage.  This is from the oxygen used during your procedure.  There is no need for concern and it should clear up in a day or so.  SYMPTOMS TO REPORT IMMEDIATELY:   Following lower endoscopy (colonoscopy or flexible sigmoidoscopy):  Excessive amounts of blood in the stool  Significant tenderness or worsening of abdominal pains  Swelling of the abdomen that is new, acute  Fever of 100F or higher For urgent or emergent issues, a gastroenterologist can be reached at any hour by calling [phone].   DIET:  We do recommend a small meal at first, but then you may proceed to your regular diet.  Drink plenty of fluids but you should avoid alcoholic beverages for 24 hours.  ACTIVITY:  You should plan to take it  easy for the rest of today and you should NOT DRIVE or use heavy machinery until tomorrow (because of the sedation medicines used during the test).    FOLLOW UP: Our staff will call the number listed on your records the next business day following your procedure to check on you and address any questions or concerns that you may have regarding the information given to you following your procedure. If we do not reach you, we will leave a message.  However, if you are feeling well and you are not experiencing any problems, there is no need to return our call.  We will assume that you have returned to your regular daily activities without incident.  If any biopsies were taken you will be contacted by phone or by letter within the next 1-3 weeks.  Please call us at [phone] if you have not heard about the biopsies in 3 weeks.    SIGNATURES/CONFIDENTIALITY: You and/or your care partner have signed paperwork which will be entered into your electronic medical record.  These signatures attest to the fact that that the information above on your After Visit Summary has been reviewed and is understood.  Full responsibility of the confidentiality of this discharge information lies with you and/or your care-partner. 

## 2016-12-17 NOTE — Progress Notes (Signed)
Called to room to assist during endoscopic procedure.  Patient ID and intended procedure confirmed with present staff. Received instructions for my participation in the procedure from the performing physician.  

## 2016-12-17 NOTE — Progress Notes (Signed)
A and O x3. Report to RN. Tolerated MAC anesthesia well. 

## 2016-12-21 ENCOUNTER — Telehealth: Payer: Self-pay | Admitting: *Deleted

## 2016-12-21 NOTE — Telephone Encounter (Signed)
  Follow up Call-  Call back number 12/17/2016  Post procedure Call Back phone  # [phone]  Permission to leave phone message Yes  Some recent data might be hidden     Patient questions:  Do you have a fever, pain , or abdominal swelling? No. Pain Score  0 *  Have you tolerated food without any problems? Yes.    Have you been able to return to your normal activities? Yes.    Do you have any questions about your discharge instructions: Diet   No. Medications  No. Follow up visit  No.  Do you have questions or concerns about your Care? No.  Actions: * If pain score is 4 or above: No action needed, pain <4.

## 2016-12-23 ENCOUNTER — Encounter: Payer: Self-pay | Admitting: Gastroenterology

## 2017-02-01 ENCOUNTER — Ambulatory Visit (INDEPENDENT_AMBULATORY_CARE_PROVIDER_SITE_OTHER): Payer: BLUE CROSS/BLUE SHIELD | Admitting: *Deleted

## 2017-02-01 DIAGNOSIS — Z23 Encounter for immunization: Secondary | ICD-10-CM | POA: Diagnosis not present

## 2017-02-07 ENCOUNTER — Encounter: Payer: Self-pay | Admitting: Family Medicine

## 2017-02-07 ENCOUNTER — Ambulatory Visit (INDEPENDENT_AMBULATORY_CARE_PROVIDER_SITE_OTHER): Payer: BLUE CROSS/BLUE SHIELD | Admitting: Family Medicine

## 2017-02-07 DIAGNOSIS — M5137 Other intervertebral disc degeneration, lumbosacral region: Secondary | ICD-10-CM | POA: Diagnosis not present

## 2017-02-07 MED ORDER — KETOROLAC TROMETHAMINE 60 MG/2ML IM SOLN
60.0000 mg | Freq: Once | INTRAMUSCULAR | Status: AC
  Administered 2017-02-07: 60 mg via INTRAMUSCULAR

## 2017-02-07 MED ORDER — PREDNISONE 20 MG PO TABS: ORAL_TABLET | ORAL | 1 refills | Status: DC

## 2017-02-07 MED ORDER — HYDROCODONE-ACETAMINOPHEN 10-325 MG PO TABS: ORAL_TABLET | ORAL | 0 refills | Status: DC

## 2017-02-07 NOTE — Patient Instructions (Signed)
Prednisone 20 mg....... 2 tabs now..... 2 tabs at bedtime tonight.......... then 2 tabs daily until your symptoms are significantly improved then taper as outlined  Vicodin 10 mg...........Marland Kitchen 1/2-1 tablet every 6-8 hours as needed for severe pain  Bedrest.................. no heating pad....... your skin is already burned  Return Wednesday for follow-up

## 2017-02-07 NOTE — Progress Notes (Signed)
Jesse Arroyo is a 56 year old married male nonsmoker comes in today for evaluation of acute low back pain  He states on Friday he was at work he jumped off a 3 foot table felt okay but then last night began having severe right-sided low back pain. He's had this problem in the past.  No bowel or bladder dysfunction  Vital signs stable he is afebrile  General he is a well-developed well-nourished male in acute pain a 8 on a scale of 1-10  In the supine position his legs reveal equal length. Sensation muscle strength reflexes within normal limits. Positive straight leg raising right leg at 45  Impression.......... lumbar disc disease........Marland Kitchen plan prednisone bedrest follow-up in 2448 hrs. Out of work at least a week

## 2017-02-09 ENCOUNTER — Ambulatory Visit (INDEPENDENT_AMBULATORY_CARE_PROVIDER_SITE_OTHER): Payer: BLUE CROSS/BLUE SHIELD | Admitting: Family Medicine

## 2017-02-09 ENCOUNTER — Encounter: Payer: Self-pay | Admitting: Family Medicine

## 2017-02-09 VITALS — BP 132/80 | HR 74 | Temp 97.5°F | Ht 72.0 in | Wt 196.5 lb

## 2017-02-09 DIAGNOSIS — M5137 Other intervertebral disc degeneration, lumbosacral region: Secondary | ICD-10-CM

## 2017-02-09 DIAGNOSIS — M51379 Other intervertebral disc degeneration, lumbosacral region without mention of lumbar back pain or lower extremity pain: Secondary | ICD-10-CM

## 2017-02-09 NOTE — Patient Instructions (Signed)
Begin to taper the prednisone as outlined  Walk............ lie down................... Walk............ no setting today........... do this for the next couple days until you can stop the pain medicine. At that juncture resume your normal activities.  We'll get you set up for PT consult ASAP to help relieve the pain and to start a back exercise program..

## 2017-02-09 NOTE — Progress Notes (Signed)
Jesse Arroyo is a 56 year old married male nonsmoker who comes back today for reevaluation of severe low back pain  We saw him yesterday with severe low back pain. Neurologic Doristine Locks was intact. He had x-rays back in 2011 which showed sclerosis L4-L5 and L5-S1 bilaterally. Otherwise spine was intact. Once or twice a year he has an episode of severe pain. He is on his recover with bedrest.  He wants to do something to prevent the pain. I recommend he see a physical therapist and start a back program.  BP 132/80 (BP Location: Left Arm, Patient Position: Sitting, Cuff Size: Normal)   Pulse 74   Temp 97.5 F (36.4 C) (Oral)   Ht 6' (1.829 m)   Wt 196 lb 8 oz (89.1 kg)   BMI 26.65 kg/m  In the supine position the legs were of equal length. Sensation muscle strength reflexes vibratory sense all within normal limits. Straight leg raising +45 right leg as it was yesterday  #1 lumbar disc disease resolving with conservative therapy........... continues conservative therapy............ PT consult ASAP to help relieve pain and to start a program of daily back exercises

## 2017-02-14 DIAGNOSIS — M5137 Other intervertebral disc degeneration, lumbosacral region: Secondary | ICD-10-CM | POA: Diagnosis not present

## 2017-02-14 DIAGNOSIS — M545 Low back pain: Secondary | ICD-10-CM | POA: Diagnosis not present

## 2017-04-20 ENCOUNTER — Encounter: Payer: Self-pay | Admitting: Adult Health

## 2017-04-20 ENCOUNTER — Ambulatory Visit (INDEPENDENT_AMBULATORY_CARE_PROVIDER_SITE_OTHER): Payer: BLUE CROSS/BLUE SHIELD | Admitting: Adult Health

## 2017-04-20 VITALS — BP 124/78 | Temp 98.1°F | Ht 72.0 in | Wt 197.9 lb

## 2017-04-20 DIAGNOSIS — I10 Essential (primary) hypertension: Secondary | ICD-10-CM

## 2017-04-20 DIAGNOSIS — R002 Palpitations: Secondary | ICD-10-CM | POA: Diagnosis not present

## 2017-04-20 DIAGNOSIS — Z0000 Encounter for general adult medical examination without abnormal findings: Secondary | ICD-10-CM | POA: Diagnosis not present

## 2017-04-20 LAB — BASIC METABOLIC PANEL
BUN: 19 mg/dL (ref 6–23)
CALCIUM: 9.8 mg/dL (ref 8.4–10.5)
CHLORIDE: 105 meq/L (ref 96–112)
CO2: 29 meq/L (ref 19–32)
CREATININE: 0.93 mg/dL (ref 0.40–1.50)
GFR: 88.7 mL/min (ref >60.00)
Glucose, Bld: 91 mg/dL (ref 70–99)
Potassium: 4.5 mEq/L (ref 3.5–5.1)
Sodium: 142 mEq/L (ref 135–145)

## 2017-04-20 LAB — CBC WITH DIFFERENTIAL/PLATELET
BASOS ABS: 0 10*3/uL (ref 0.0–0.1)
BASOS PCT: 0.5 % (ref 0.0–3.0)
EOS ABS: 0.1 10*3/uL (ref 0.0–0.7)
Eosinophils Relative: 1.3 % (ref 0.0–5.0)
HCT: 45.6 % (ref 39.0–52.0)
Hemoglobin: 15.6 g/dL (ref 13.0–17.0)
Lymphocytes Relative: 31.5 % (ref 12.0–46.0)
Lymphs Abs: 1.9 10*3/uL (ref 0.7–4.0)
MCHC: 34.1 g/dL (ref 30.0–36.0)
MCV: 92.6 fl (ref 78.0–100.0)
MONO ABS: 0.5 10*3/uL (ref 0.1–1.0)
Monocytes Relative: 8 % (ref 3.0–12.0)
NEUTROS ABS: 3.5 10*3/uL (ref 1.4–7.7)
NEUTROS PCT: 58.7 % (ref 43.0–77.0)
PLATELETS: 237 10*3/uL (ref 150.0–400.0)
RBC: 4.93 Mil/uL (ref 4.22–5.81)
RDW: 14.5 % (ref 11.5–15.5)
WBC: 5.9 10*3/uL (ref 4.0–10.5)

## 2017-04-20 LAB — HEPATIC FUNCTION PANEL
ALT: 22 U/L (ref 0–53)
AST: 19 U/L (ref 0–37)
Albumin: 4.4 g/dL (ref 3.5–5.2)
Alkaline Phosphatase: 36 U/L — ABNORMAL LOW (ref 39–117)
BILIRUBIN DIRECT: 0.1 mg/dL (ref 0.0–0.3)
BILIRUBIN TOTAL: 0.4 mg/dL (ref 0.2–1.2)
Total Protein: 6.8 g/dL (ref 6.0–8.3)

## 2017-04-20 LAB — LIPID PANEL
CHOL/HDL RATIO: 4
Cholesterol: 205 mg/dL — ABNORMAL HIGH (ref 0–200)
HDL: 52.9 mg/dL (ref >39.00)
LDL CALC: 122 mg/dL — AB (ref 0–99)
NonHDL: 152.14
Triglycerides: 153 mg/dL — ABNORMAL HIGH (ref 0.0–149.0)
VLDL: 30.6 mg/dL (ref 0.0–40.0)

## 2017-04-20 LAB — TSH: TSH: 1.29 u[IU]/mL (ref 0.35–4.50)

## 2017-04-20 LAB — PSA: PSA: 0.69 ng/mL (ref 0.10–4.00)

## 2017-04-20 NOTE — Progress Notes (Signed)
Patient presents to clinic today to establish care. He is a pleasant 56 year old male who  has a past medical history of Allergy; Colonic mass; Deviated nasal septum (01/14/14); Hypertension; and Skin abnormalities.   He is a former patient of Dr. Sherren Mocha. His last physical was in 03/2016   Acute Concerns: Establish Care/Complete Physical Exam   " Heart pounding" - reports over the last 1-2 months he has felt a " real heavy beating of my heart". These episodes last an hour or two at a time. This has been happening every day for the last month. It usually happens when he lies down in bed for the night. When he wakes up he does feel  that this feeling is absent, but as the day goes on the feeling of " heart pounding" returns. He has had one instance of chest pain about a month ago that caused him to vomit. He does report palpitations. Denies drinking caffeine throughout the day.    Chronic Issues: Essential Hypertension - Takes Zestoretic 20-12.5 mg. Reports being controlled   Health Maintenance: Dental --Routine  Vision -- Does not do routine  Immunizations -- UTD Colonoscopy -- 2017 - 5 year due to polyps  Diet: Tries to eat healthy  Exercise: No formal exercise but stays active throughout the day.    Past Medical History:  Diagnosis Date  . Allergy   . Colonic mass   . Deviated nasal septum 01/14/14   per pt  . Hypertension   . Skin abnormalities    feet    Past Surgical History:  Procedure Laterality Date  . COLONOSCOPY    . HEMICOLECTOMY  05/10/12  . INSERTION OF MESH N/A 01/23/2014   Procedure: OPEN INSERTION OF MESH;  Surgeon: Stark Klein, MD;  Location: [facility];  Service: General;  Laterality: N/A;  . VASECTOMY    . VENTRAL HERNIA REPAIR N/A 01/23/2014   Procedure: LAPAROSCOPIC VENTRAL HERNIA REPAIR;  Surgeon: Stark Klein, MD;  Location: [facility];  Service: General;  Laterality: N/A;  . WISDOM TOOTH EXTRACTION      Current Outpatient Prescriptions on File Prior to  Visit  Medication Sig Dispense Refill  . aspirin 81 MG tablet Take 81 mg by mouth daily with breakfast.     . lisinopril-hydrochlorothiazide (ZESTORETIC) 20-12.5 MG tablet Take 1 tablet by mouth daily. 90 tablet 3  . Multiple Vitamin (MULTIVITAMIN) tablet Take 1 tablet by mouth daily with breakfast.     . sildenafil (VIAGRA) 100 MG tablet Take 0.5-1 tablets (50-100 mg total) by mouth daily as needed for erectile dysfunction. 10 tablet 11   No current facility-administered medications on file prior to visit.     No Known Allergies  Family History  Problem Relation Age of Onset  . Lung cancer Father 61  . Hypertension Mother   . Hypertension Other   . Colon cancer Neg Hx   . Rectal cancer Neg Hx   . Stomach cancer Neg Hx   . Pancreatic cancer Neg Hx   . Colon polyps Neg Hx     Social History   Social History  . Marital status: Married    Spouse name: N/A  . Number of children: 2  . Years of education: N/A   Occupational History  . [employer]   Social History Main Topics  . Smoking status: Former Smoker    Types: Cigarettes    Quit date: 12/27/1993  . Smokeless tobacco: Former Systems developer  Types: Snuff    Quit date: 12/28/2007  . Alcohol use 2.4 oz/week    4 Glasses of wine per week  . Drug use: No  . Sexual activity: Not on file   Other Topics Concern  . Not on file   Social History Narrative  . No narrative on file    Review of Systems  Constitutional: Negative.   HENT: Negative.   Eyes: Negative.   Respiratory: Negative.   Cardiovascular: Negative.   Gastrointestinal: Negative.   Genitourinary: Negative.   Musculoskeletal: Negative.   Skin: Negative.   Neurological: Negative.   Endo/Heme/Allergies: Negative.   Psychiatric/Behavioral: Negative.   All other systems reviewed and are negative.   BP 124/78 (BP Location: Left Arm, Patient Position: Sitting, Cuff Size: Normal)   Temp 98.1 F (36.7 C) (Oral)   Ht 6' (1.829 m)    Wt 197 lb 14.4 oz (89.8 kg)   BMI 26.84 kg/m   Physical Exam  Constitutional: He is oriented to person, place, and time and well-developed, well-nourished, and in no distress. No distress.  HENT:  Head: Normocephalic and atraumatic.  Right Ear: External ear normal.  Left Ear: External ear normal.  Nose: Nose normal.  Mouth/Throat: Oropharynx is clear and moist. No oropharyngeal exudate.  Eyes: Conjunctivae and EOM are normal. Pupils are equal, round, and reactive to light. Right eye exhibits no discharge. Left eye exhibits no discharge. No scleral icterus.  Neck: Trachea normal and normal range of motion. Neck supple. Carotid bruit is not present. No thyroid mass and no thyromegaly present.  Cardiovascular: Normal rate, regular rhythm, normal heart sounds and intact distal pulses.  Exam reveals no gallop and no friction rub.   No murmur heard. Pulmonary/Chest: Effort normal and breath sounds normal. No respiratory distress. He has no wheezes. He has no rales. He exhibits no tenderness.  Abdominal: Soft. Bowel sounds are normal. He exhibits no distension and no mass. There is no tenderness. There is no rebound and no guarding.  Genitourinary: Rectum normal and prostate normal.  Musculoskeletal: Normal range of motion. He exhibits no edema, tenderness or deformity.  Lymphadenopathy:    He has no cervical adenopathy.  Neurological: He is alert and oriented to person, place, and time. He has normal reflexes. He displays normal reflexes. No cranial nerve deficit. He exhibits normal muscle tone. Gait normal. Coordination normal. GCS score is 15.  Skin: Skin is warm and dry. No rash noted. He is not diaphoretic. No erythema. No pallor.  Surgical scar on abdomen from ventral hernia repair   Psychiatric: Mood, memory, affect and judgment normal.  Nursing note and vitals reviewed.  Assessment/Plan:  1. Routine general medical examination at a health care facility - Encouraged to incorporate  regular exercise and continue to eat a heart healthy diet.  - Basic metabolic panel - CBC with Differential/Platelet - Hepatic function panel - Lipid panel - PSA - TSH  2. Essential hypertension - Well controlled on current medication  - Basic metabolic panel - CBC with Differential/Platelet - Hepatic function panel - Lipid panel - PSA - TSH  3. Pounding heartbeat - EKG 12-Lead- NSR, rate 64 - Basic metabolic panel - CBC with Differential/Platelet - Hepatic function panel - Lipid panel - PSA - TSH - Cardiac event monitor; Future - Consider echo and cardiac referral   [name], NP

## 2017-04-20 NOTE — Patient Instructions (Signed)
It was great meeting you today   I will follow up with you regarding your blood work   Someone will contact you to get you set up with the heart monitor.

## 2017-04-27 ENCOUNTER — Other Ambulatory Visit: Payer: Self-pay | Admitting: Adult Health

## 2017-04-27 ENCOUNTER — Ambulatory Visit (INDEPENDENT_AMBULATORY_CARE_PROVIDER_SITE_OTHER): Payer: BLUE CROSS/BLUE SHIELD

## 2017-04-27 DIAGNOSIS — R002 Palpitations: Secondary | ICD-10-CM

## 2017-04-29 ENCOUNTER — Telehealth: Payer: Self-pay

## 2017-04-29 NOTE — Telephone Encounter (Signed)
Patient was put on Dr. Hassell Done schedule for Monday for SVT (from a monitor report from 5/4). Patient does not have a cardiologist. Per Dr. Irish Lack, patient needs to see EP.   Called patient and he is not having any symptoms at this time. Informed him that his appointment for Monday has been cancelled and that he will receive a call from the EP scheduler on Monday. ER precautions reviewed with patient. Patient verbalized understanding.cu

## 2017-05-02 ENCOUNTER — Ambulatory Visit: Payer: BLUE CROSS/BLUE SHIELD | Admitting: Interventional Cardiology

## 2017-05-03 ENCOUNTER — Encounter: Payer: Self-pay | Admitting: Cardiology

## 2017-05-04 ENCOUNTER — Ambulatory Visit (INDEPENDENT_AMBULATORY_CARE_PROVIDER_SITE_OTHER): Payer: BLUE CROSS/BLUE SHIELD | Admitting: Cardiology

## 2017-05-04 ENCOUNTER — Encounter: Payer: Self-pay | Admitting: Cardiology

## 2017-05-04 ENCOUNTER — Encounter (INDEPENDENT_AMBULATORY_CARE_PROVIDER_SITE_OTHER): Payer: Self-pay

## 2017-05-04 VITALS — BP 120/70 | HR 83 | Ht 72.0 in | Wt 196.6 lb

## 2017-05-04 DIAGNOSIS — I10 Essential (primary) hypertension: Secondary | ICD-10-CM | POA: Diagnosis not present

## 2017-05-04 DIAGNOSIS — R002 Palpitations: Secondary | ICD-10-CM | POA: Diagnosis not present

## 2017-05-04 NOTE — Patient Instructions (Addendum)
Medication Instructions:    Your physician recommends that you continue on your current medications as directed. Please refer to the Current Medication list given to you today.  Labwork:  None ordered  Testing/Procedures:  None ordered  Follow-Up:  To be determined once monitor is completed and reviewed by Dr. Curt Bears.  Nurse will call you if he feels you need to follow up with him.  - If you need a refill on your cardiac medications before your next appointment, please call your pharmacy.   Thank you for choosing [facility]!!   Trinidad Curet, RN [phone]  Any Other Special Instructions Will Be Listed Below (If Applicable).

## 2017-05-04 NOTE — Progress Notes (Signed)
Electrophysiology Office Note   Date:  05/04/2017   ID:  SHA AMER, DOB Dec 18, 1961, MRN [id number]  PCP:  [name], NP  Primary Electrophysiologist:  Constance Haw, MD    Chief Complaint  Patient presents with  . Advice Only    SVT on event monitor     History of Present Illness: GARNER DULLEA is a 56 y.o. male who is being seen today for the evaluation of palpitations at the request of [name], NP. Presenting today for electrophysiology evaluation. He has had palpitations for the last 1-2 months. He has a heavy beating of his heart. Episodes last 1-2 hours. Been happening every day for the last month. Usually happens when he lies down in bed. When he wakes up the sensation is gone. Did have an episode of chest pain one month ago which caused him to vomit. He has been wearing a 30 day monitor. He has not had episodes of palpitations since wearing his monitor. His palpitations occur mainly at night when he is lying down. He notes a pounding in his chest, but does not feel like his heart is racing. Last Friday, with a monitor on, he was dancing with his wife, and got called by the monitor company saying that his heart rate was elevated.    Today, he denies symptoms of palpitations, chest pain, shortness of breath, orthopnea, PND, lower extremity edema, claudication, dizziness, presyncope, syncope, bleeding, or neurologic sequela. The patient is tolerating medications without difficulties.    Past Medical History:  Diagnosis Date  . Allergy   . Colonic mass   . Deviated nasal septum 01/14/14   per pt  . Hypertension   . Skin abnormalities    feet   Past Surgical History:  Procedure Laterality Date  . COLONOSCOPY    . HEMICOLECTOMY  05/10/12  . INSERTION OF MESH N/A 01/23/2014   Procedure: OPEN INSERTION OF MESH;  Surgeon: Stark Klein, MD;  Location: [facility];  Service: General;  Laterality: N/A;  . VASECTOMY    . VENTRAL HERNIA REPAIR N/A 01/23/2014   Procedure: LAPAROSCOPIC VENTRAL HERNIA REPAIR;  Surgeon: Stark Klein, MD;  Location: [facility];  Service: General;  Laterality: N/A;  . WISDOM TOOTH EXTRACTION       Current Outpatient Prescriptions  Medication Sig Dispense Refill  . aspirin 81 MG tablet Take 81 mg by mouth daily with breakfast.     . lisinopril-hydrochlorothiazide (ZESTORETIC) 20-12.5 MG tablet Take 1 tablet by mouth daily. 90 tablet 3  . Multiple Vitamin (MULTIVITAMIN) tablet Take 1 tablet by mouth daily with breakfast.     . sildenafil (VIAGRA) 100 MG tablet Take 0.5-1 tablets (50-100 mg total) by mouth daily as needed for erectile dysfunction. 10 tablet 11   No current facility-administered medications for this visit.     Allergies:   Patient has no known allergies.   Social History:  The patient  reports that he quit smoking about 23 years ago. His smoking use included Cigarettes. He quit smokeless tobacco use about 9 years ago. His smokeless tobacco use included Snuff. He reports that he drinks about 2.4 oz of alcohol per week . He reports that he does not use drugs.   Family History:  The patient's family history includes Hypertension in his mother and other; Lung cancer (age of onset: 59) in his father.    ROS:  Please see the history of present illness.   Otherwise, review of systems is positive for none.  All other systems are reviewed and negative.    PHYSICAL EXAM: VS:  BP 120/70   Pulse 83   Ht 6' (1.829 m)   Wt 196 lb 9.6 oz (89.2 kg)   SpO2 99%   BMI 26.66 kg/m  , BMI Body mass index is 26.66 kg/m. GEN: Well nourished, well developed, in no acute distress  HEENT: normal  Neck: no JVD, carotid bruits, or masses Cardiac: RRR; no murmurs, rubs, or gallops,no edema  Respiratory:  clear to auscultation bilaterally, normal work of breathing GI: soft, nontender, nondistended, + BS MS: no deformity or atrophy  Skin: warm and dry Neuro:  Strength and sensation are intact Psych: euthymic mood, full  affect  EKG:  EKG is not ordered today. Personal review of the ekg ordered 04/20/17 shows sinus rhythm, rate 64  Recent Labs: 04/20/2017: ALT 22; BUN 19; Creatinine, Ser 0.93; Hemoglobin 15.6; Platelets 237.0; Potassium 4.5; Sodium 142; TSH 1.29    Lipid Panel     Component Value Date/Time   CHOL 205 (H) 04/20/2017 0855   TRIG 153.0 (H) 04/20/2017 0855   TRIG 130 10/06/2006 1114   HDL 52.90 04/20/2017 0855   CHOLHDL 4 04/20/2017 0855   VLDL 30.6 04/20/2017 0855   LDLCALC 122 (H) 04/20/2017 0855   LDLDIRECT 110.0 02/03/2016 1648     Wt Readings from Last 3 Encounters:  05/04/17 196 lb 9.6 oz (89.2 kg)  04/20/17 197 lb 14.4 oz (89.8 kg)  02/09/17 196 lb 8 oz (89.1 kg)      Other studies Reviewed: Additional studies/ records that were reviewed today include: PCP notes   ASSESSMENT AND PLAN:  1.  Palpitations: He has not had palpitations since wearing his monitor. He did have an episode of tachycardia when he was dancing with his wife last Friday. It is difficult to tell due to sinus tachycardia versus SVT. I have asked him to continue to wear the monitor, as his most recent episode is unclear.  I did discuss with her the possibilities of therapy if he does have obvious SVT being medical management versus ablation. We Vernelle Wisner see him back after his monitor is finished.  2. Hypertension: Well-controlled today. No medication changes at this time.   Current medicines are reviewed at length with the patient today.   The patient does not have concerns regarding his medicines.  The following changes were made today:  none  Labs/ tests ordered today include:  No orders of the defined types were placed in this encounter.    Disposition:   FU with Karris Deangelo post monitor months  Signed, Ladye Macnaughton Meredith Leeds, MD  05/04/2017 10:20 AM     [facility] [address] [place] [address] [phone] (office) [phone] (fax)

## 2017-07-29 ENCOUNTER — Telehealth: Payer: Self-pay | Admitting: Cardiology

## 2017-07-29 NOTE — Telephone Encounter (Signed)
New message    Pt is calling returning call to [facility].

## 2017-07-29 NOTE — Telephone Encounter (Signed)
Advised patient he could see Dr. Curt Bears PRN going forward.  Monitor only showed once episode of SVT.  Pt will continue to monitor and call if he begins to experience further issues.

## 2017-08-11 ENCOUNTER — Ambulatory Visit (INDEPENDENT_AMBULATORY_CARE_PROVIDER_SITE_OTHER)
Admission: RE | Admit: 2017-08-11 | Discharge: 2017-08-11 | Disposition: A | Discharge status: Home / Self Care | Payer: BLUE CROSS/BLUE SHIELD | Source: Ambulatory Visit | Attending: Adult Health | Admitting: Adult Health

## 2017-08-11 ENCOUNTER — Ambulatory Visit (INDEPENDENT_AMBULATORY_CARE_PROVIDER_SITE_OTHER): Payer: BLUE CROSS/BLUE SHIELD | Admitting: Adult Health

## 2017-08-11 ENCOUNTER — Encounter: Payer: Self-pay | Admitting: Adult Health

## 2017-08-11 VITALS — BP 130/80 | Temp 98.2°F | Wt 197.0 lb

## 2017-08-11 DIAGNOSIS — R109 Unspecified abdominal pain: Secondary | ICD-10-CM

## 2017-08-11 DIAGNOSIS — R918 Other nonspecific abnormal finding of lung field: Secondary | ICD-10-CM | POA: Diagnosis not present

## 2017-08-11 NOTE — Progress Notes (Signed)
Subjective:    Patient ID: Jesse Arroyo, male    DOB: Jun 15, 1961, 56 y.o.   MRN: [id number]  HPI  56 year old male who  has a past medical history of Allergy; Colonic mass; Deviated nasal septum (01/14/14); Hypertension; and Skin abnormalities. He presents to the office today with the complaint of left rib pain. He reports having a mechanical fall about three weeks ago onto a grassy surface. Pain on left flank started last week. Reports pain is worse when coughing and laying on left side. Denies any SOB  He has not been using anything to help with the pain    Review of Systems See HPI   Past Medical History:  Diagnosis Date  . Allergy   . Colonic mass   . Deviated nasal septum 01/14/14   per pt  . Hypertension   . Skin abnormalities    feet    Social History   Social History  . Marital status: Married    Spouse name: N/A  . Number of children: 2  . Years of education: N/A   Occupational History  . [employer]   Social History Main Topics  . Smoking status: Former Smoker    Types: Cigarettes    Quit date: 12/27/1993  . Smokeless tobacco: Former Systems developer    Types: Snuff    Quit date: 12/28/2007  . Alcohol use 2.4 oz/week    4 Glasses of wine per week  . Drug use: No  . Sexual activity: Not on file   Other Topics Concern  . Not on file   Social History Narrative   Married   Two daughters - One lives at home.           Past Surgical History:  Procedure Laterality Date  . COLONOSCOPY    . HEMICOLECTOMY  05/10/12  . INSERTION OF MESH N/A 01/23/2014   Procedure: OPEN INSERTION OF MESH;  Surgeon: Stark Klein, MD;  Location: [facility];  Service: General;  Laterality: N/A;  . VASECTOMY    . VENTRAL HERNIA REPAIR N/A 01/23/2014   Procedure: LAPAROSCOPIC VENTRAL HERNIA REPAIR;  Surgeon: Stark Klein, MD;  Location: [facility];  Service: General;  Laterality: N/A;  . WISDOM TOOTH EXTRACTION      Family History  Problem Relation Age of Onset    . Lung cancer Father 73  . Hypertension Mother   . Hypertension Other   . Colon cancer Neg Hx   . Rectal cancer Neg Hx   . Stomach cancer Neg Hx   . Pancreatic cancer Neg Hx   . Colon polyps Neg Hx     No Known Allergies  Current Outpatient Prescriptions on File Prior to Visit  Medication Sig Dispense Refill  . aspirin 81 MG tablet Take 81 mg by mouth daily with breakfast.     . lisinopril-hydrochlorothiazide (ZESTORETIC) 20-12.5 MG tablet Take 1 tablet by mouth daily. 90 tablet 3  . Multiple Vitamin (MULTIVITAMIN) tablet Take 1 tablet by mouth daily with breakfast.     . sildenafil (VIAGRA) 100 MG tablet Take 0.5-1 tablets (50-100 mg total) by mouth daily as needed for erectile dysfunction. 10 tablet 11   No current facility-administered medications on file prior to visit.     BP 130/80 (BP Location: Right Arm)   Temp 98.2 F (36.8 C) (Oral)   Wt 197 lb (89.4 kg)   BMI 26.72 kg/m       Objective:   Physical Exam  Constitutional: He is oriented to person, place, and time. He appears well-developed and well-nourished. No distress.  Cardiovascular: Normal rate, regular rhythm, normal heart sounds and intact distal pulses.  Exam reveals no gallop and no friction rub.   No murmur heard. Pulmonary/Chest: Effort normal and breath sounds normal. No respiratory distress. He has no wheezes. He has no rales. He exhibits no tenderness.  Musculoskeletal: He exhibits tenderness (rib 9 & 10 ).  Neurological: He is alert and oriented to person, place, and time.  Skin: Skin is warm and dry. No rash noted. He is not diaphoretic. No erythema. No pallor.  No bruising noted.   Psychiatric: He has a normal mood and affect. His behavior is normal. Judgment and thought content normal.  Nursing note and vitals reviewed.     Assessment & Plan:  1. Left flank pain - Likely contusion. We spoke about getting an x ray and how it would not change POC. He would like to get a chest x ray to make  sure he does not have a fracture - DG Chest 2 View; Future - Mortin and Ice to help with symptom relief.   [name], NP

## 2017-08-22 ENCOUNTER — Encounter: Payer: Self-pay | Admitting: Family Medicine

## 2017-08-22 ENCOUNTER — Ambulatory Visit (INDEPENDENT_AMBULATORY_CARE_PROVIDER_SITE_OTHER): Payer: BLUE CROSS/BLUE SHIELD | Admitting: Family Medicine

## 2017-08-22 VITALS — BP 126/75 | HR 79 | Temp 98.5°F | Ht 72.0 in | Wt 193.0 lb

## 2017-08-22 DIAGNOSIS — R319 Hematuria, unspecified: Secondary | ICD-10-CM | POA: Diagnosis not present

## 2017-08-22 DIAGNOSIS — N529 Male erectile dysfunction, unspecified: Secondary | ICD-10-CM | POA: Diagnosis not present

## 2017-08-22 LAB — POC URINALSYSI DIPSTICK (AUTOMATED)
BILIRUBIN UA: NEGATIVE
GLUCOSE UA: NEGATIVE
Ketones, UA: NEGATIVE
Leukocytes, UA: NEGATIVE
Nitrite, UA: NEGATIVE
PH UA: 6 (ref 5.0–8.0)
Protein, UA: NEGATIVE
Urobilinogen, UA: 0.2 E.U./dL

## 2017-08-22 MED ORDER — CIPROFLOXACIN HCL 500 MG PO TABS: 500.0000 mg | ORAL_TABLET | Freq: Two times a day (BID) | ORAL | 0 refills | Status: DC

## 2017-08-22 MED ORDER — SILDENAFIL CITRATE 100 MG PO TABS: 50.0000 mg | ORAL_TABLET | Freq: Every day | ORAL | 11 refills | Status: DC | PRN

## 2017-08-22 NOTE — Progress Notes (Signed)
   Subjective:    Patient ID: Jesse Arroyo, male    DOB: [birth year], 56 y.o.   MRN: [id number]  HPI Here for blood in the urine. He noticed this about 3 days ago while [place] the beach. He sees a small amount of bright red blood everytime he passes urine. There is some burning on urination, but no frequency or urgency. No back pain or fever. He has had numerous episodes like this starting back in 2004. He has never seen a Dealer.   Review of Systems  Constitutional: Negative.   Respiratory: Negative.   Cardiovascular: Negative.   Gastrointestinal: Negative.   Genitourinary: Positive for dysuria and hematuria. Negative for discharge, flank pain, frequency, testicular pain and urgency.       Objective:   Physical Exam  Constitutional: He appears well-developed and well-nourished.  Cardiovascular: Normal rate, regular rhythm, normal heart sounds and intact distal pulses.   Pulmonary/Chest: Effort normal and breath sounds normal.  Abdominal: Soft. Bowel sounds are normal. He exhibits no distension and no mass. There is no tenderness.  Genitourinary: Penis normal.  Genitourinary Comments: Testicles are not tender           Assessment & Plan:  Recurrent hematuria. We will treat for a possible UTI with Cipro, and we will culture the sample. Refer to Urology for a definitive workup.  Alysia Penna, MD

## 2017-08-22 NOTE — Patient Instructions (Signed)
WE NOW OFFER   [facility] FAST TRACK!!!  SAME DAY Appointments for ACUTE CARE  Such as: Sprains, Injuries, cuts, abrasions, rashes, muscle pain, joint pain, back pain Colds, flu, sore throats, headache, allergies, cough, fever  Ear pain, sinus and eye infections Abdominal pain, nausea, vomiting, diarrhea, upset stomach Animal/insect bites  3 Easy Ways to Schedule: Walk-In Scheduling Call in scheduling Mychart Sign-up: [email or web address]         

## 2017-08-23 LAB — URINE CULTURE

## 2017-09-16 ENCOUNTER — Encounter: Payer: Self-pay | Admitting: Adult Health

## 2017-09-17 ENCOUNTER — Other Ambulatory Visit: Payer: Self-pay | Admitting: Family Medicine

## 2017-10-21 ENCOUNTER — Ambulatory Visit (INDEPENDENT_AMBULATORY_CARE_PROVIDER_SITE_OTHER): Payer: BLUE CROSS/BLUE SHIELD | Admitting: Emergency Medicine

## 2017-10-21 DIAGNOSIS — Z23 Encounter for immunization: Secondary | ICD-10-CM | POA: Diagnosis not present

## 2017-11-09 DIAGNOSIS — R310 Gross hematuria: Secondary | ICD-10-CM | POA: Diagnosis not present

## 2017-11-25 DIAGNOSIS — R310 Gross hematuria: Secondary | ICD-10-CM | POA: Diagnosis not present

## 2017-12-07 DIAGNOSIS — R310 Gross hematuria: Secondary | ICD-10-CM | POA: Diagnosis not present

## 2018-04-25 ENCOUNTER — Ambulatory Visit (INDEPENDENT_AMBULATORY_CARE_PROVIDER_SITE_OTHER): Payer: BLUE CROSS/BLUE SHIELD | Admitting: Adult Health

## 2018-04-25 ENCOUNTER — Encounter: Payer: Self-pay | Admitting: Adult Health

## 2018-04-25 VITALS — BP 136/80 | Temp 98.2°F | Ht 71.5 in | Wt 189.0 lb

## 2018-04-25 DIAGNOSIS — Z0184 Encounter for antibody response examination: Secondary | ICD-10-CM | POA: Diagnosis not present

## 2018-04-25 DIAGNOSIS — M545 Low back pain, unspecified: Secondary | ICD-10-CM

## 2018-04-25 DIAGNOSIS — Z0001 Encounter for general adult medical examination with abnormal findings: Secondary | ICD-10-CM

## 2018-04-25 DIAGNOSIS — M6740 Ganglion, unspecified site: Secondary | ICD-10-CM

## 2018-04-25 DIAGNOSIS — I10 Essential (primary) hypertension: Secondary | ICD-10-CM | POA: Diagnosis not present

## 2018-04-25 DIAGNOSIS — Z0000 Encounter for general adult medical examination without abnormal findings: Secondary | ICD-10-CM

## 2018-04-25 DIAGNOSIS — Z125 Encounter for screening for malignant neoplasm of prostate: Secondary | ICD-10-CM | POA: Diagnosis not present

## 2018-04-25 MED ORDER — CYCLOBENZAPRINE HCL 10 MG PO TABS: 10.0000 mg | ORAL_TABLET | Freq: Three times a day (TID) | ORAL | 0 refills | Status: DC | PRN

## 2018-04-25 MED ORDER — METHYLPREDNISOLONE 4 MG PO TBPK: ORAL_TABLET | ORAL | 0 refills | Status: DC

## 2018-04-25 NOTE — Progress Notes (Signed)
Subjective:    Patient ID: Jesse Arroyo, male    DOB: [birth year], 59 y.o.   MRN: [id number]  HPI  Patient presents for yearly preventative medicine examination. He is a pleasant 57 year old male who  has a past medical history of Allergy, Colonic mass, Deviated nasal septum (01/14/14), Hypertension, and Skin abnormalities.  Hypertension - Takes Zestoretic.   BP Readings from Last 3 Encounters:  04/25/18 136/80  08/22/17 126/75  08/11/17 130/80   Palpitations.   Was seen by cardiology in the past for this issue.  Last time he was seen by cardiology was 05/04/2017.  At that point in time he had been wearing a 30-day event monitor and was advised to follow-up with cardiology once event monitor was complete.  He did not follow-up with cardiology at this time.  Event monitor showed sinus rhythm, sinus tachycardia, SVT.  No atrial fibrillation.  Continues to have episodes of palpitations since that time  Right lower back pain-been an issue since August 2018 -status post mechanical fall onto a grassy surface.  Reports the pain is constant and is worse with weightbearing activities, twisting and bending.  Takes over-the-counter anti-inflammatories periodically which do not help with the pain.  Additionally, he has a cyst on the distal end of his right middle finger he would like to have removed.  Would like to be checked to make sure he is immune to measles.  All immunizations and health maintenance protocols were reviewed with the patient and needed orders were placed.  Appropriate screening laboratory values were ordered for the patient including screening of hyperlipidemia, renal function and hepatic function. If indicated by BPH, a PSA was ordered.  Medication reconciliation,  past medical history, social history, problem list and allergies were reviewed in detail with the patient  Goals were established with regard to weight loss, exercise, and  diet in compliance with  medications   Review of Systems  Constitutional: Negative.   HENT: Negative.   Eyes: Negative.   Respiratory: Negative.   Cardiovascular: Negative.   Gastrointestinal: Negative.   Endocrine: Negative.   Genitourinary: Negative.   Musculoskeletal: Positive for back pain and myalgias.  Skin: Negative.   Allergic/Immunologic: Negative.   Neurological: Negative.   Hematological: Negative.   Psychiatric/Behavioral: Negative.   All other systems reviewed and are negative.  Past Medical History:  Diagnosis Date  . Allergy   . Colonic mass   . Deviated nasal septum 01/14/14   per pt  . Hypertension   . Skin abnormalities    feet    Social History   Socioeconomic History  . Marital status: Married    Spouse name: Not on file  . Number of children: 2  . Years of education: Not on file  . Highest education level: Not on file  Occupational History  . Occupation: Chartered loss adjuster: [employer]  Social Needs  . Financial resource strain: Not on file  . Food insecurity:    Worry: Not on file    Inability: Not on file  . Transportation needs:    Medical: Not on file    Non-medical: Not on file  Tobacco Use  . Smoking status: Former Smoker    Types: Cigarettes    Last attempt to quit: 12/27/1993    Years since quitting: 24.3  . Smokeless tobacco: Former Systems developer    Types: Snuff    Quit date: 12/28/2007  Substance and Sexual Activity  . Alcohol use:  Yes    Alcohol/week: 2.4 oz    Types: 4 Glasses of wine per week  . Drug use: No  . Sexual activity: Not on file  Lifestyle  . Physical activity:    Days per week: Not on file    Minutes per session: Not on file  . Stress: Not on file  Relationships  . Social connections:    Talks on phone: Not on file    Gets together: Not on file    Attends religious service: Not on file    Active member of club or organization: Not on file    Attends meetings of clubs or organizations: Not on file     Relationship status: Not on file  . Intimate partner violence:    Fear of current or ex partner: Not on file    Emotionally abused: Not on file    Physically abused: Not on file    Forced sexual activity: Not on file  Other Topics Concern  . Not on file  Social History Narrative   Married   Two daughters - One lives at home.        Past Surgical History:  Procedure Laterality Date  . COLONOSCOPY    . HEMICOLECTOMY  05/10/12  . INSERTION OF MESH N/A 01/23/2014   Procedure: OPEN INSERTION OF MESH;  Surgeon: Stark Klein, MD;  Location: [facility];  Service: General;  Laterality: N/A;  . VASECTOMY    . VENTRAL HERNIA REPAIR N/A 01/23/2014   Procedure: LAPAROSCOPIC VENTRAL HERNIA REPAIR;  Surgeon: Stark Klein, MD;  Location: [facility];  Service: General;  Laterality: N/A;  . WISDOM TOOTH EXTRACTION      Family History  Problem Relation Age of Onset  . Lung cancer Father 53  . Hypertension Mother   . Hypertension Other   . Colon cancer Neg Hx   . Rectal cancer Neg Hx   . Stomach cancer Neg Hx   . Pancreatic cancer Neg Hx   . Colon polyps Neg Hx     No Known Allergies  Current Outpatient Medications on File Prior to Visit  Medication Sig Dispense Refill  . aspirin 81 MG tablet Take 81 mg by mouth daily with breakfast.     . lisinopril-hydrochlorothiazide (PRINZIDE,ZESTORETIC) 20-12.5 MG tablet TAKE 1 TABLET BY MOUTH DAILY. 90 tablet 3  . Multiple Vitamin (MULTIVITAMIN) tablet Take 1 tablet by mouth daily with breakfast.     . sildenafil (VIAGRA) 100 MG tablet Take 0.5-1 tablets (50-100 mg total) by mouth daily as needed for erectile dysfunction. 10 tablet 11   No current facility-administered medications on file prior to visit.     BP 136/80 (BP Location: Left Arm)   Temp 98.2 F (36.8 C) (Oral)   Ht 5' 11.5" (1.816 m) Comment: WITHOUT SHOES  Wt 189 lb (85.7 kg)   BMI 25.99 kg/m       Objective:   Physical Exam  Constitutional: He is oriented to person, place, and time.  He appears well-developed and well-nourished. No distress.  HENT:  Head: Normocephalic and atraumatic.  Right Ear: External ear normal.  Left Ear: External ear normal.  Mouth/Throat: Oropharynx is clear and moist. No oropharyngeal exudate.  Eyes: Pupils are equal, round, and reactive to light. Conjunctivae and EOM are normal. Right eye exhibits no discharge. Left eye exhibits no discharge. No scleral icterus.  Neck: Normal range of motion. Neck supple. No JVD present. No tracheal deviation present. No thyromegaly present.  Cardiovascular: Normal rate, normal  heart sounds and intact distal pulses. Exam reveals no gallop and no friction rub.  No murmur heard. Pulmonary/Chest: Effort normal and breath sounds normal. No respiratory distress. He has no wheezes. He has no rales. He exhibits no tenderness.  Abdominal: Soft. Bowel sounds are normal. He exhibits no distension and no mass. There is no tenderness. There is no rebound and no guarding. No hernia.  Genitourinary:  Genitourinary Comments: Will do PSA  Musculoskeletal: Normal range of motion. He exhibits tenderness. He exhibits no edema or deformity.  Full spasm noted in right lower back.  No pain with palpation to lumbar spine.  Noted at the distal aspect of the right middle finger.  There is some disfigurement to fingernail  Lymphadenopathy:    He has no cervical adenopathy.  Neurological: He is alert and oriented to person, place, and time. He displays normal reflexes. No cranial nerve deficit or sensory deficit. He exhibits normal muscle tone. Coordination normal.  Skin: Skin is warm and dry. Capillary refill takes less than 2 seconds. No rash noted. No erythema.  Psychiatric: He has a normal mood and affect. His behavior is normal.      Assessment & Plan:  1. Routine general medical examination at a health care facility -  - Basic metabolic panel - CBC with Differential/Platelet - Hepatic function panel - Lipid panel - TSH  2.  Essential hypertension - No change in medication  - Basic metabolic panel - CBC with Differential/Platelet - Hepatic function panel - Lipid panel - TSH  3. Immunity to measles determined by serologic test  - Measles/Mumps/Rubella Immunity; Future  4. Acute right-sided low back pain without sciatica  - methylPREDNISolone (MEDROL DOSEPAK) 4 MG TBPK tablet; Take as directed  Dispense: 21 tablet; Refill: 0 - cyclobenzaprine (FLEXERIL) 10 MG tablet; Take 1 tablet (10 mg total) by mouth 3 (three) times daily as needed for muscle spasms.  Dispense: 30 tablet; Refill: 0 - Follow up if no improvement after treatment  - Advised massage and heating pad  5. Ganglion cyst  - Ambulatory referral to Hand Surgery  6. Prostate cancer screening  - PSA  [name], NP

## 2018-04-25 NOTE — Patient Instructions (Addendum)
Follow up with Dr. Reggy Eye  with cardiology for palpitations   [address], [place], [place] [address] [phone]  Please make an appointment for lab work - do not eat anything after midnight the night before the lab work   I have sent in a muscle relaxer and prednisone for your low back pain - please let me know if this does not work

## 2018-04-26 DIAGNOSIS — Z0184 Encounter for antibody response examination: Secondary | ICD-10-CM | POA: Diagnosis not present

## 2018-04-26 LAB — CBC WITH DIFFERENTIAL/PLATELET
BASOS ABS: 0 10*3/uL (ref 0.0–0.1)
BASOS PCT: 0.1 % (ref 0.0–3.0)
EOS ABS: 0 10*3/uL (ref 0.0–0.7)
Eosinophils Relative: 0 % (ref 0.0–5.0)
HCT: 44.1 % (ref 39.0–52.0)
Hemoglobin: 15.1 g/dL (ref 13.0–17.0)
LYMPHS ABS: 1.1 10*3/uL (ref 0.7–4.0)
Lymphocytes Relative: 11.5 % — ABNORMAL LOW (ref 12.0–46.0)
MCHC: 34.3 g/dL (ref 30.0–36.0)
MCV: 91.8 fl (ref 78.0–100.0)
MONO ABS: 0.4 10*3/uL (ref 0.1–1.0)
Monocytes Relative: 3.7 % (ref 3.0–12.0)
NEUTROS ABS: 8.1 10*3/uL — AB (ref 1.4–7.7)
NEUTROS PCT: 84.7 % — AB (ref 43.0–77.0)
PLATELETS: 268 10*3/uL (ref 150.0–400.0)
RBC: 4.81 Mil/uL (ref 4.22–5.81)
RDW: 13.9 % (ref 11.5–15.5)
WBC: 9.6 10*3/uL (ref 4.0–10.5)

## 2018-04-26 LAB — HEPATIC FUNCTION PANEL
ALT: 19 U/L (ref 0–53)
AST: 13 U/L (ref 0–37)
Albumin: 4.1 g/dL (ref 3.5–5.2)
Alkaline Phosphatase: 38 U/L — ABNORMAL LOW (ref 39–117)
BILIRUBIN DIRECT: 0.1 mg/dL (ref 0.0–0.3)
TOTAL PROTEIN: 6.6 g/dL (ref 6.0–8.3)
Total Bilirubin: 0.8 mg/dL (ref 0.2–1.2)

## 2018-04-26 LAB — PSA: PSA: 0.56 ng/mL (ref 0.10–4.00)

## 2018-04-26 LAB — BASIC METABOLIC PANEL
BUN: 16 mg/dL (ref 6–23)
CALCIUM: 9.5 mg/dL (ref 8.4–10.5)
CHLORIDE: 103 meq/L (ref 96–112)
CO2: 28 meq/L (ref 19–32)
CREATININE: 0.9 mg/dL (ref 0.40–1.50)
GFR: 91.8 mL/min (ref >60.00)
GLUCOSE: 107 mg/dL — AB (ref 70–99)
Potassium: 5 mEq/L (ref 3.5–5.1)
Sodium: 141 mEq/L (ref 135–145)

## 2018-04-26 LAB — LIPID PANEL
CHOL/HDL RATIO: 3
Cholesterol: 190 mg/dL (ref 0–200)
HDL: 54.8 mg/dL (ref >39.00)
LDL Cholesterol: 121 mg/dL — ABNORMAL HIGH (ref 0–99)
NONHDL: 134.77
TRIGLYCERIDES: 70 mg/dL (ref 0.0–149.0)
VLDL: 14 mg/dL (ref 0.0–40.0)

## 2018-04-26 LAB — TSH: TSH: 0.53 u[IU]/mL (ref 0.35–4.50)

## 2018-04-26 NOTE — Addendum Note (Signed)
Addended by: Elmer Picker on: 04/26/2018 07:45 AM   Modules accepted: Orders

## 2018-04-27 LAB — MEASLES/MUMPS/RUBELLA IMMUNITY
MUMPS IGG: 85.3 [AU]/ml
RUBELLA: 8.99 {index}
Rubeola IgG: 60.8 AU/mL

## 2018-05-03 DIAGNOSIS — M19041 Primary osteoarthritis, right hand: Secondary | ICD-10-CM | POA: Diagnosis not present

## 2018-05-03 DIAGNOSIS — R229 Localized swelling, mass and lump, unspecified: Secondary | ICD-10-CM | POA: Diagnosis not present

## 2018-05-04 ENCOUNTER — Other Ambulatory Visit: Payer: Self-pay | Admitting: Orthopedic Surgery

## 2018-05-16 ENCOUNTER — Encounter (HOSPITAL_BASED_OUTPATIENT_CLINIC_OR_DEPARTMENT_OTHER): Admission: RE | Payer: Self-pay | Source: Ambulatory Visit

## 2018-05-16 ENCOUNTER — Ambulatory Visit (HOSPITAL_BASED_OUTPATIENT_CLINIC_OR_DEPARTMENT_OTHER)
Admission: RE | Admit: 2018-05-16 | Payer: BLUE CROSS/BLUE SHIELD | Source: Ambulatory Visit | Admitting: Orthopedic Surgery

## 2018-05-16 SURGERY — EXCISION MASS
Anesthesia: Regional | Laterality: Right

## 2018-08-24 ENCOUNTER — Encounter: Payer: Self-pay | Admitting: Adult Health

## 2018-08-24 ENCOUNTER — Ambulatory Visit: Payer: BLUE CROSS/BLUE SHIELD | Admitting: Adult Health

## 2018-08-24 VITALS — BP 142/80 | Temp 98.0°F | Wt 185.0 lb

## 2018-08-24 DIAGNOSIS — R634 Abnormal weight loss: Secondary | ICD-10-CM

## 2018-08-24 DIAGNOSIS — R5383 Other fatigue: Secondary | ICD-10-CM | POA: Diagnosis not present

## 2018-08-24 LAB — CBC WITH DIFFERENTIAL/PLATELET
BASOS ABS: 0 10*3/uL (ref 0.0–0.1)
Basophils Relative: 0.4 % (ref 0.0–3.0)
Eosinophils Absolute: 0 10*3/uL (ref 0.0–0.7)
Eosinophils Relative: 0.6 % (ref 0.0–5.0)
HCT: 45.2 % (ref 39.0–52.0)
Hemoglobin: 15.2 g/dL (ref 13.0–17.0)
LYMPHS ABS: 1.5 10*3/uL (ref 0.7–4.0)
Lymphocytes Relative: 19.8 % (ref 12.0–46.0)
MCHC: 33.7 g/dL (ref 30.0–36.0)
MCV: 92.1 fl (ref 78.0–100.0)
MONOS PCT: 6.7 % (ref 3.0–12.0)
Monocytes Absolute: 0.5 10*3/uL (ref 0.1–1.0)
NEUTROS PCT: 72.5 % (ref 43.0–77.0)
Neutro Abs: 5.6 10*3/uL (ref 1.4–7.7)
Platelets: 239 10*3/uL (ref 150.0–400.0)
RBC: 4.91 Mil/uL (ref 4.22–5.81)
RDW: 13.7 % (ref 11.5–15.5)
WBC: 7.7 10*3/uL (ref 4.0–10.5)

## 2018-08-24 LAB — BASIC METABOLIC PANEL
BUN: 16 mg/dL (ref 6–23)
CALCIUM: 9.9 mg/dL (ref 8.4–10.5)
CO2: 30 mEq/L (ref 19–32)
Chloride: 103 mEq/L (ref 96–112)
Creatinine, Ser: 1.01 mg/dL (ref 0.40–1.50)
GFR: 80.27 mL/min (ref >60.00)
GLUCOSE: 101 mg/dL — AB (ref 70–99)
Potassium: 4.9 mEq/L (ref 3.5–5.1)
SODIUM: 141 meq/L (ref 135–145)

## 2018-08-24 LAB — TSH: TSH: 0.82 u[IU]/mL (ref 0.35–4.50)

## 2018-08-24 LAB — HEPATIC FUNCTION PANEL
ALBUMIN: 4.5 g/dL (ref 3.5–5.2)
ALK PHOS: 41 U/L (ref 39–117)
ALT: 19 U/L (ref 0–53)
AST: 16 U/L (ref 0–37)
Bilirubin, Direct: 0.1 mg/dL (ref 0.0–0.3)
Total Bilirubin: 0.5 mg/dL (ref 0.2–1.2)
Total Protein: 7 g/dL (ref 6.0–8.3)

## 2018-08-24 LAB — HEMOGLOBIN A1C: Hgb A1c MFr Bld: 6 % (ref 4.6–6.5)

## 2018-08-24 LAB — PSA: PSA: 0.56 ng/mL (ref 0.10–4.00)

## 2018-08-24 NOTE — Progress Notes (Signed)
Subjective:    Patient ID: Jesse Arroyo, male    DOB: 1961/08/14, 57 y.o.   MRN: [id number]  HPI  56 year old male who  has a past medical history of Allergy, Colonic mass, Deviated nasal septum (01/14/14), Hypertension, and Skin abnormalities. He presents to the clinic today with the concern of unintentional weight loss and progressively worse fatigue over the last six months.  Fatigue -he reports fatigue has been constant over the last 6 months and states "most days I get home from work and I feel so tired that I do not want to do anything".  Also ports that he often feels fatigued in the morning when he wakes up, he does snore and has had episodes where he wakes up gasping for breath.  Reports going to bed around 7 or 8:00 in the evening and gets out of bed around 5.  This routine has not changed.  He does not take a nap when he gets home from work  Weight Loss -again, this started approximately 6 months ago and he does report eating a light breakfast and lunch but does not feel like eating dinner when he gets home from work due to loss of appetite.  He often has to force himself to eat.  He does report that he is cut back on drinking more in the past he would have a beer after work in 2-3 bottles of wine per week, currently he will have 2 or 3 beers on the weekend and a couple glasses of wine throughout the week.  He does report that he walks on average 2 to 3 miles a day at work but does not do any formal exercises  He endorses intermittent episodes of nausea but denies vomiting and has not experienced any fevers.  Status post hemicolectomy in 2013 so his bowels are "never normal", has episodes of alternating bowel habits of constipation then "a much".  He does report that when he has a normal consistency that it comes out appearing as a pencil.  Denies any blood in stool  Any anxiety or depression but does have stressors at work despite not working any longer of hours  Wt Readings from Last  3 Encounters:  08/24/18 185 lb (83.9 kg)  04/25/18 189 lb (85.7 kg)  08/22/17 193 lb (87.5 kg)    Review of Systems  Constitutional: Positive for activity change, appetite change and fatigue. Negative for diaphoresis and fever.  HENT: Negative.   Respiratory: Negative.   Cardiovascular: Negative.   Gastrointestinal: Positive for abdominal pain, constipation, diarrhea and nausea. Negative for abdominal distention, anal bleeding, blood in stool, rectal pain and vomiting.  Endocrine: Negative.   Genitourinary: Negative.   Musculoskeletal: Positive for back pain (chronic ).  Skin: Negative.   Neurological: Positive for weakness. Negative for dizziness, seizures, syncope and headaches.  Hematological: Negative.   Psychiatric/Behavioral: Negative.    Past Medical History:  Diagnosis Date  . Allergy   . Colonic mass   . Deviated nasal septum 01/14/14   per pt  . Hypertension   . Skin abnormalities    feet    Social History   Socioeconomic History  . Marital status: Married    Spouse name: Not on file  . Number of children: 2  . Years of education: Not on file  . Highest education level: Not on file  Occupational History  . Occupation: Chartered loss adjuster: [employer]  Social Needs  .  Financial resource strain: Not on file  . Food insecurity:    Worry: Not on file    Inability: Not on file  . Transportation needs:    Medical: Not on file    Non-medical: Not on file  Tobacco Use  . Smoking status: Former Smoker    Types: Cigarettes    Last attempt to quit: 12/27/1993    Years since quitting: 24.6  . Smokeless tobacco: Former Systems developer    Types: Snuff    Quit date: 12/28/2007  Substance and Sexual Activity  . Alcohol use: Yes    Alcohol/week: 4.0 standard drinks    Types: 4 Glasses of wine per week  . Drug use: No  . Sexual activity: Not on file  Lifestyle  . Physical activity:    Days per week: Not on file    Minutes per session: Not on file    . Stress: Not on file  Relationships  . Social connections:    Talks on phone: Not on file    Gets together: Not on file    Attends religious service: Not on file    Active member of club or organization: Not on file    Attends meetings of clubs or organizations: Not on file    Relationship status: Not on file  . Intimate partner violence:    Fear of current or ex partner: Not on file    Emotionally abused: Not on file    Physically abused: Not on file    Forced sexual activity: Not on file  Other Topics Concern  . Not on file  Social History Narrative   Married   Two daughters - One lives at home.        Past Surgical History:  Procedure Laterality Date  . COLONOSCOPY    . HEMICOLECTOMY  05/10/12  . INSERTION OF MESH N/A 01/23/2014   Procedure: OPEN INSERTION OF MESH;  Surgeon: Stark Klein, MD;  Location: [facility];  Service: General;  Laterality: N/A;  . VASECTOMY    . VENTRAL HERNIA REPAIR N/A 01/23/2014   Procedure: LAPAROSCOPIC VENTRAL HERNIA REPAIR;  Surgeon: Stark Klein, MD;  Location: [facility];  Service: General;  Laterality: N/A;  . WISDOM TOOTH EXTRACTION      Family History  Problem Relation Age of Onset  . Lung cancer Father 56  . Hypertension Mother   . Hypertension Other   . Colon cancer Neg Hx   . Rectal cancer Neg Hx   . Stomach cancer Neg Hx   . Pancreatic cancer Neg Hx   . Colon polyps Neg Hx     No Known Allergies  Current Outpatient Medications on File Prior to Visit  Medication Sig Dispense Refill  . aspirin 81 MG tablet Take 81 mg by mouth daily with breakfast.     . lisinopril-hydrochlorothiazide (PRINZIDE,ZESTORETIC) 20-12.5 MG tablet TAKE 1 TABLET BY MOUTH DAILY. 90 tablet 3  . Multiple Vitamin (MULTIVITAMIN) tablet Take 1 tablet by mouth daily with breakfast.     . sildenafil (VIAGRA) 100 MG tablet Take 0.5-1 tablets (50-100 mg total) by mouth daily as needed for erectile dysfunction. 10 tablet 11   No current facility-administered  medications on file prior to visit.     BP (!) 142/80   Temp 98 F (36.7 C)   Wt 185 lb (83.9 kg)   BMI 25.44 kg/m       Objective:   Physical Exam  Constitutional: He is oriented to person, place, and time. He  appears well-developed and well-nourished. No distress.  Eyes: Pupils are equal, round, and reactive to light. EOM are normal. Right eye exhibits no discharge. Left eye exhibits no discharge.  Neck: Normal range of motion. Neck supple.  Cardiovascular: Normal rate, regular rhythm, normal heart sounds and intact distal pulses. Exam reveals no gallop and no friction rub.  No murmur heard. Pulmonary/Chest: Effort normal and breath sounds normal. No stridor. No respiratory distress. He has no wheezes. He has no rales. He exhibits no tenderness.  Abdominal: Soft. Bowel sounds are normal. He exhibits no distension and no mass. There is no hepatosplenomegaly, splenomegaly or hepatomegaly. There is tenderness in the right upper quadrant, periumbilical area, left upper quadrant and left lower quadrant. There is no rigidity, no rebound, no guarding, no CVA tenderness, no tenderness at McBurney's point and negative Murphy's sign. No hernia. Hernia confirmed negative in the ventral area, confirmed negative in the right inguinal area and confirmed negative in the left inguinal area.  Genitourinary: Rectum normal and prostate normal. Rectal exam shows no external hemorrhoid, no internal hemorrhoid, no fissure, no mass, no tenderness, anal tone normal and guaiac negative stool.  Musculoskeletal: He exhibits no edema, tenderness or deformity.  Lymphadenopathy:    He has no cervical adenopathy.  Neurological: He is alert and oriented to person, place, and time. He displays normal reflexes. No cranial nerve deficit or sensory deficit. He exhibits normal muscle tone. Coordination normal.  Skin: Skin is warm and dry. Capillary refill takes less than 2 seconds. No rash noted. He is not diaphoretic. No  erythema. No pallor.  Old surgical scar on abdomen   Psychiatric: He has a normal mood and affect. His speech is normal. Judgment and thought content normal. He is slowed.  Nursing note and vitals reviewed.     Assessment & Plan:  8 pound weight loss over the last year  Unknown cause at this point in time, possibly multifactorial.  His PHQ 9 score was 11, but he refutes that he has any form of depression.  He does not want to go on medication at this time.  I will order labs below, get a chest x-ray and CT of the abdomen pelvis.  Appears that he has some form of sleep apnea so will refer to pulmonary for evaluation of sleep apnea and possible sleep study.  Cannot rule out GI cause, due to history of hemicolectomy he would like to see a GI specialist.  1. Weight loss, unintentional  - Basic metabolic panel - CBC with Differential/Platelet - Hepatic function panel - TSH - PSA - DG Chest 2 View; Future - Hep C Antibody - HIV antibody - POC Urinalysis Dipstick - Hemoglobin A1c - CT Abdomen Pelvis W Contrast; Future - Ambulatory referral to Gastroenterology  2. Fatigue, unspecified type  - Basic metabolic panel - CBC with Differential/Platelet - Hepatic function panel - TSH - PSA - DG Chest 2 View; Future - Hep C Antibody - HIV antibody - POC Urinalysis Dipstick - Hemoglobin A1c - CT Abdomen Pelvis W Contrast; Future - Ambulatory referral to Pulmonology  [name], NP

## 2018-08-25 ENCOUNTER — Ambulatory Visit (INDEPENDENT_AMBULATORY_CARE_PROVIDER_SITE_OTHER): Payer: BLUE CROSS/BLUE SHIELD

## 2018-08-25 DIAGNOSIS — R5383 Other fatigue: Secondary | ICD-10-CM | POA: Diagnosis not present

## 2018-08-25 DIAGNOSIS — R634 Abnormal weight loss: Secondary | ICD-10-CM | POA: Diagnosis not present

## 2018-08-25 LAB — POCT URINALYSIS DIPSTICK
BILIRUBIN UA: NEGATIVE
Blood, UA: NEGATIVE
Glucose, UA: NEGATIVE
KETONES UA: NEGATIVE
Leukocytes, UA: NEGATIVE
NITRITE UA: NEGATIVE
Odor: NEGATIVE
Protein, UA: NEGATIVE
Spec Grav, UA: 1.02 (ref 1.010–1.025)
Urobilinogen, UA: 0.2 E.U./dL
pH, UA: 7 (ref 5.0–8.0)

## 2018-08-25 LAB — HEPATITIS C ANTIBODY
HEP C AB: NONREACTIVE
SIGNAL TO CUT-OFF: 0.51 (ref <1.00)

## 2018-08-25 LAB — HIV ANTIBODY (ROUTINE TESTING W REFLEX): HIV: NONREACTIVE

## 2018-09-05 ENCOUNTER — Ambulatory Visit (INDEPENDENT_AMBULATORY_CARE_PROVIDER_SITE_OTHER)
Admission: RE | Admit: 2018-09-05 | Discharge: 2018-09-05 | Disposition: A | Discharge status: Home / Self Care | Payer: BLUE CROSS/BLUE SHIELD | Source: Ambulatory Visit | Attending: Adult Health | Admitting: Adult Health

## 2018-09-05 DIAGNOSIS — R5383 Other fatigue: Secondary | ICD-10-CM

## 2018-09-05 DIAGNOSIS — R634 Abnormal weight loss: Secondary | ICD-10-CM

## 2018-09-05 MED ORDER — IOPAMIDOL (ISOVUE-300) INJECTION 61%
100.0000 mL | Freq: Once | INTRAVENOUS | Status: AC | PRN
  Administered 2018-09-05: 100 mL via INTRAVENOUS

## 2018-09-06 ENCOUNTER — Telehealth: Payer: Self-pay | Admitting: *Deleted

## 2018-09-06 ENCOUNTER — Telehealth: Payer: Self-pay | Admitting: Adult Health

## 2018-09-06 NOTE — Telephone Encounter (Signed)
Copied from [facility]. Topic: Inquiry >> Sep 06, 2018  2:06 PM Conception Chancy, NT wrote: Reason for CRM: patient would like to get his CT results from 09/05/18.

## 2018-09-06 NOTE — Telephone Encounter (Signed)
Updated patient on the results of CT.   He reports that he has not lost any more weight and is maintaining. He has not started Prilosec yet but will do so

## 2018-09-07 ENCOUNTER — Encounter: Payer: Self-pay | Admitting: Nurse Practitioner

## 2018-09-07 ENCOUNTER — Ambulatory Visit: Payer: BLUE CROSS/BLUE SHIELD | Admitting: Nurse Practitioner

## 2018-09-07 VITALS — BP 110/62 | HR 66 | Ht 72.0 in | Wt 188.8 lb

## 2018-09-07 DIAGNOSIS — R634 Abnormal weight loss: Secondary | ICD-10-CM

## 2018-09-07 DIAGNOSIS — R6881 Early satiety: Secondary | ICD-10-CM

## 2018-09-07 DIAGNOSIS — K5900 Constipation, unspecified: Secondary | ICD-10-CM

## 2018-09-07 NOTE — Progress Notes (Signed)
Primary GI:  Jesse Bowie, MD  Chief Complaint: weight loss, early satiety  Referring Provider:    [name], NP    ASSESSMENT AND PLAN;   #41. 57 year old male with fatigue and early satiety with 9-12 pound weight loss over last couple of months. Labs and CT scan negative.  We discussed upper endoscopy though I told him it may be low yield.  At least we can exclude upper GI pathology as source of nausea.   -The risks and benefits of EGD were discussed and the patient agrees to proceed.    #2.  Bowel changes, incomplete evacuation.  Requiring several bowel movements a day which are all mushy, low-volume. Overall sounds like he is having constipation with decrease in motility.  Patient is up-to-date on colonoscopy.  -I have asked him to increase water intake to at least 6 to 8 glasses a day. -trial of daily Benefiber.  #3.  History of tubulovillous adenoma polyps.  He is status post right hemicolectomy in 2015 for TVA involving ICV.  Due for surveillance colonoscopy 2022   HPI:    Patient is a 57 year old male status post right hemicolectomy for tubulovillous adenoma involving the IC valve (2015). He referred by PCP for evaluation of unintentional weight loss.  Lost 9 pounds over the last month He also complains of excessive fatigue.  He has early satiety, remains "full" going into the next meal. He has decreased his alcohol consumption as well.  He has occasional nausea but no vomiting.  He has had some recent bowel changes.  Stool consistency has always been "mushy" and that is unchanged but having more frequent BMs during the day. Volume is small, rarely feels completely evacuated.  He feels the need to defecate after lifting heavy objects. He has occasional diffuse lower abdominal discomfort that does get better with defecation.  No blood in stool.  Lakoda thinks some of his symptoms could be secondary to work related stress.  Patient is a little discouraged at this point since  labs and CT scan have been untelling.   Data reviewed: Recent chemistry profile, CBC, TSH and PSA normal CT scan of the abdomen and pelvis with contrast negative for any acute abnormalities  ROS:  No chest pain, no shortness of breath, no fevers.  No urinary symptoms.  Past Medical History:  Diagnosis Date  . Allergy   . Colonic mass   . Deviated nasal septum 01/14/14   per pt  . Hypertension   . Skin abnormalities    feet     Past Surgical History:  Procedure Laterality Date  . COLON RESECTION  2013  . COLONOSCOPY    . HEMICOLECTOMY  05/10/12  . INSERTION OF MESH N/A 01/23/2014   Procedure: OPEN INSERTION OF MESH;  Surgeon: Stark Klein, MD;  Location: [facility];  Service: General;  Laterality: N/A;  . VASECTOMY    . VENTRAL HERNIA REPAIR N/A 01/23/2014   Procedure: LAPAROSCOPIC VENTRAL HERNIA REPAIR;  Surgeon: Stark Klein, MD;  Location: [facility];  Service: General;  Laterality: N/A;  . WISDOM TOOTH EXTRACTION     Family History  Problem Relation Age of Onset  . Lung cancer Father 15  . Hypertension Mother   . Hypertension Other   . Colon cancer Neg Hx   . Rectal cancer Neg Hx   . Stomach cancer Neg Hx   . Pancreatic cancer Neg Hx   . Colon polyps Neg Hx    Social History   Tobacco  Use  . Smoking status: Former Smoker    Types: Cigarettes    Last attempt to quit: 12/27/1993    Years since quitting: 24.7  . Smokeless tobacco: Former Systems developer    Types: Snuff    Quit date: 12/28/2007  Substance Use Topics  . Alcohol use: Yes    Alcohol/week: 4.0 standard drinks    Types: 4 Glasses of wine per week  . Drug use: No   Current Outpatient Medications  Medication Sig Dispense Refill  . aspirin 81 MG tablet Take 81 mg by mouth daily with breakfast.     . lisinopril-hydrochlorothiazide (PRINZIDE,ZESTORETIC) 20-12.5 MG tablet TAKE 1 TABLET BY MOUTH DAILY. 90 tablet 3  . Multiple Vitamin (MULTIVITAMIN) tablet Take 1 tablet by mouth daily with breakfast.     . sildenafil (VIAGRA)  100 MG tablet Take 0.5-1 tablets (50-100 mg total) by mouth daily as needed for erectile dysfunction. 10 tablet 11   No current facility-administered medications for this visit.    No Known Allergies   Serum creatinine: 1.01 mg/dL 08/24/18 1222 Estimated creatinine clearance: 86.4 mL/min   Physical Exam:    Wt Readings from Last 3 Encounters:  09/07/18 188 lb 12.8 oz (85.6 kg)  08/24/18 185 lb (83.9 kg)  04/25/18 189 lb (85.7 kg)    BP 110/62   Pulse 66   Ht 6' (1.829 m)   Wt 188 lb 12.8 oz (85.6 kg)   BMI 25.61 kg/m  Constitutional:  Pleasant well developed male in no acute distress. Psychiatric: Normal mood and affect. Behavior is normal. EENT: Pupils normal.  Conjunctivae are normal. No scleral icterus. Neck supple.  Cardiovascular: Normal rate, regular rhythm. No edema Pulmonary/chest: Effort normal and breath sounds normal. No wheezing, rales or rhonchi. Abdominal: Soft, nondistended, nontender. Bowel sounds active throughout. There are no masses palpable. No hepatomegaly. Neurological: Alert and oriented to person place and time. Skin: Skin is warm and dry. No rashes noted.  Tye Savoy, NP  09/07/2018, 2:39 PM  Cc: Jesse Peng, NP

## 2018-09-07 NOTE — Patient Instructions (Signed)
If you are age 59 or older, your body mass index should be between 23-30. Your Body mass index is 25.61 kg/m. If this is out of the aforementioned range listed, please consider follow up with your Primary Care Provider.  If you are age 46 or younger, your body mass index should be between 19-25. Your Body mass index is 25.61 kg/m. If this is out of the aformentioned range listed, please consider follow up with your Primary Care Provider.   You have been scheduled for an endoscopy. Please follow written instructions given to you at your visit today. If you use inhalers (even only as needed), please bring them with you on the day of your procedure. Your physician has requested that you go to www.startemmi.com and enter the access code given to you at your visit today. This web site gives a general overview about your procedure. However, you should still follow specific instructions given to you by our office regarding your preparation for the procedure.  Increase water to 6-8 8 ounce glasses daily.  Start Benefiber once daily.  Thank you for choosing me and [facility].   Tye Savoy, NP

## 2018-09-08 ENCOUNTER — Encounter: Payer: Self-pay | Admitting: Nurse Practitioner

## 2018-09-09 ENCOUNTER — Other Ambulatory Visit: Payer: Self-pay | Admitting: Family Medicine

## 2018-09-11 NOTE — Progress Notes (Signed)
S/p limited cecal resection for TVA in 2015. Colonoscopy in 2017 with removal of TA. Due for surveillance in 5 years. Reviewed and agree with documentation and assessment and plan. Damaris Hippo , MD

## 2018-10-02 ENCOUNTER — Encounter: Payer: Self-pay | Admitting: Pulmonary Disease

## 2018-10-02 ENCOUNTER — Ambulatory Visit (INDEPENDENT_AMBULATORY_CARE_PROVIDER_SITE_OTHER): Payer: BLUE CROSS/BLUE SHIELD | Admitting: Pulmonary Disease

## 2018-10-02 VITALS — BP 128/68 | HR 82 | Ht 71.5 in | Wt 188.4 lb

## 2018-10-02 DIAGNOSIS — R5383 Other fatigue: Secondary | ICD-10-CM

## 2018-10-02 DIAGNOSIS — Z87898 Personal history of other specified conditions: Secondary | ICD-10-CM

## 2018-10-02 NOTE — Progress Notes (Signed)
Jesse Arroyo    [id number]    10-31-1961  Primary Care Physician:Jesse Arroyo  Referring Physician: [name], Arroy[place] Jesse Arroyo [id number]  Chief complaint:   History of fatigue, history of snoring  HPI:  Patient admits to fatigue, history of snoring No history of witnessed apneas Usually goes to bed about 8 PM, wakes up about 6 AM Wakes up at least once during the night to use the bathroom He feels he gets an adequate amount of sleep Work is stressful, intense-  He believed he had more problems when he was drinking heavy  NyQuil causes breathing to be significantly worse  Occupation: Artist Smoking history: Reformed smoker  Outpatient Encounter Medications as of 10/02/2018  Medication Sig  . aspirin 81 MG tablet Take 81 mg by mouth daily with breakfast.   . lisinopril-hydrochlorothiazide (PRINZIDE,ZESTORETIC) 20-12.5 MG tablet TAKE 1 TABLET BY MOUTH DAILY.  . Multiple Vitamin (MULTIVITAMIN) tablet Take 1 tablet by mouth daily with breakfast.   . sildenafil (VIAGRA) 100 MG tablet Take 0.5-1 tablets (50-100 mg total) by mouth daily as needed for erectile dysfunction.   No facility-administered encounter medications on file as of 10/02/2018.     Allergies as of 10/02/2018  . (No Known Allergies)    Past Medical History:  Diagnosis Date  . Allergy   . Colonic mass   . Deviated nasal septum 01/14/14   per pt  . Hypertension   . Skin abnormalities    feet    Past Surgical History:  Procedure Laterality Date  . COLON RESECTION  2013  . COLONOSCOPY    . HEMICOLECTOMY  05/10/12  . INSERTION OF MESH N/A 01/23/2014   Procedure: OPEN INSERTION OF MESH;  Surgeon: Stark Klein, MD;  Location: [facility];  Service: General;  Laterality: N/A;  . VASECTOMY    . VENTRAL HERNIA REPAIR N/A 01/23/2014   Procedure: LAPAROSCOPIC VENTRAL HERNIA REPAIR;  Surgeon: Stark Klein, MD;  Location: [facility];  Service: General;  Laterality:  N/A;  . WISDOM TOOTH EXTRACTION      Family History  Problem Relation Age of Onset  . Lung cancer Father 39  . Hypertension Mother   . Hypertension Other   . Colon cancer Neg Hx   . Rectal cancer Neg Hx   . Stomach cancer Neg Hx   . Pancreatic cancer Neg Hx   . Colon polyps Neg Hx     Social History   Socioeconomic History  . Marital status: Married    Spouse name: Not on file  . Number of children: 2  . Years of education: Not on file  . Highest education level: Not on file  Occupational History  . Occupation: Chartered loss adjuster: [employer]  Social Needs  . Financial resource strain: Not on file  . Food insecurity:    Worry: Not on file    Inability: Not on file  . Transportation needs:    Medical: Not on file    Non-medical: Not on file  Tobacco Use  . Smoking status: Former Smoker    Types: Cigarettes    Last attempt to quit: 12/27/1993    Years since quitting: 24.7  . Smokeless tobacco: Former Systems developer    Types: Snuff    Quit date: 12/28/2007  Substance and Sexual Activity  . Alcohol use: Yes    Alcohol/week: 4.0 standard drinks    Types: 4  Glasses of wine per week  . Drug use: No  . Sexual activity: Not on file  Lifestyle  . Physical activity:    Days per week: Not on file    Minutes per session: Not on file  . Stress: Not on file  Relationships  . Social connections:    Talks on phone: Not on file    Gets together: Not on file    Attends religious service: Not on file    Active member of club or organization: Not on file    Attends meetings of clubs or organizations: Not on file    Relationship status: Not on file  . Intimate partner violence:    Fear of current or ex partner: Not on file    Emotionally abused: Not on file    Physically abused: Not on file    Forced sexual activity: Not on file  Other Topics Concern  . Not on file  Social History Narrative   Married   Two daughters - One lives at home.        Review  of Systems  Constitutional: Positive for unexpected weight change. Negative for activity change.  HENT: Negative.        History of deviated septum  Eyes: Negative.   Respiratory: Negative for chest tightness, shortness of breath and stridor.   Cardiovascular: Negative.  Negative for chest pain and leg swelling.  Gastrointestinal: Negative.   Endocrine: Negative.   Genitourinary: Negative.   Musculoskeletal: Negative.     Vitals:   10/02/18 1415  BP: 128/68  Pulse: 82  SpO2: 97%     Physical Exam  Constitutional: He is oriented to person, place, and time. He appears well-developed.  HENT:  Head: Normocephalic and atraumatic.  Mallampati 1  Eyes: Pupils are equal, round, and reactive to light. Conjunctivae and EOM are normal. Right eye exhibits no discharge.  Neck: Normal range of motion. Neck supple. No thyromegaly present.  Cardiovascular: Normal rate and regular rhythm.  Pulmonary/Chest: Breath sounds normal. No respiratory distress. He has no wheezes.  Abdominal: Soft. Bowel sounds are normal. He exhibits no distension. There is no tenderness.  Musculoskeletal: He exhibits no edema.  Neurological: He is alert and oriented to person, place, and time. No cranial nerve deficit. Coordination normal.  Skin: Skin is warm and dry.   Assessment:   Moderate probability of significant sleep disordered breathing  Fatigue  Daytime sleepiness -Feels this is more fatigue, and its related to him working long hours  History of snoring -Long-standing history  Plan/Recommendations:  Reluctant to proceed with a sleep study at present  Pathophysiology of sleep disordered breathing discussed  Treatment options of sleep disordered breathing discussed  Will be glad to see him as needed in the office  Sherrilyn Rist MD [facility] 10/02/2018, 2:25 PM  CC: Jesse Arroyo

## 2018-10-02 NOTE — Patient Instructions (Signed)
History of snoring  Fatigue, some component of sleepiness  I will recommend a sleep study, but, as you are not inclined to having one at the present time-  I will recommend you optimize sleep hygiene, get enough hours of sleep, moderate alcohol use  I will be glad to see you in the office if you change your mind

## 2018-10-04 ENCOUNTER — Encounter: Payer: BLUE CROSS/BLUE SHIELD | Admitting: Gastroenterology

## 2019-01-12 ENCOUNTER — Ambulatory Visit (INDEPENDENT_AMBULATORY_CARE_PROVIDER_SITE_OTHER): Payer: BLUE CROSS/BLUE SHIELD | Admitting: *Deleted

## 2019-01-12 DIAGNOSIS — Z23 Encounter for immunization: Secondary | ICD-10-CM

## 2019-08-22 ENCOUNTER — Other Ambulatory Visit: Payer: Self-pay | Admitting: Family Medicine

## 2019-08-24 MED ORDER — LISINOPRIL-HYDROCHLOROTHIAZIDE 20-12.5 MG PO TABS: 1.0000 | ORAL_TABLET | Freq: Every day | ORAL | 0 refills | Status: DC

## 2019-08-24 NOTE — Telephone Encounter (Signed)
Sent to the pharmacy by e-scribe for 90 days.  Pt has upcoming cpx. 

## 2019-09-04 ENCOUNTER — Other Ambulatory Visit: Payer: Self-pay

## 2019-09-04 ENCOUNTER — Encounter: Payer: Self-pay | Admitting: Adult Health

## 2019-09-04 ENCOUNTER — Ambulatory Visit (INDEPENDENT_AMBULATORY_CARE_PROVIDER_SITE_OTHER): Payer: BC Managed Care – PPO | Admitting: Adult Health

## 2019-09-04 VITALS — BP 132/74 | Temp 98.1°F | Ht 72.5 in | Wt 187.0 lb

## 2019-09-04 DIAGNOSIS — Z125 Encounter for screening for malignant neoplasm of prostate: Secondary | ICD-10-CM

## 2019-09-04 DIAGNOSIS — Z0000 Encounter for general adult medical examination without abnormal findings: Secondary | ICD-10-CM

## 2019-09-04 DIAGNOSIS — Z23 Encounter for immunization: Secondary | ICD-10-CM

## 2019-09-04 DIAGNOSIS — R5383 Other fatigue: Secondary | ICD-10-CM | POA: Diagnosis not present

## 2019-09-04 DIAGNOSIS — I10 Essential (primary) hypertension: Secondary | ICD-10-CM | POA: Diagnosis not present

## 2019-09-04 LAB — COMPREHENSIVE METABOLIC PANEL
ALT: 23 U/L (ref 0–53)
AST: 20 U/L (ref 0–37)
Albumin: 4.2 g/dL (ref 3.5–5.2)
Alkaline Phosphatase: 41 U/L (ref 39–117)
BUN: 15 mg/dL (ref 6–23)
CO2: 28 mEq/L (ref 19–32)
Calcium: 9.6 mg/dL (ref 8.4–10.5)
Chloride: 104 mEq/L (ref 96–112)
Creatinine, Ser: 0.95 mg/dL (ref 0.40–1.50)
GFR: 80.77 mL/min (ref >60.00)
Glucose, Bld: 89 mg/dL (ref 70–99)
Potassium: 4.8 mEq/L (ref 3.5–5.1)
Sodium: 140 mEq/L (ref 135–145)
Total Bilirubin: 0.5 mg/dL (ref 0.2–1.2)
Total Protein: 6.8 g/dL (ref 6.0–8.3)

## 2019-09-04 LAB — CBC WITH DIFFERENTIAL/PLATELET
Basophils Absolute: 0 10*3/uL (ref 0.0–0.1)
Basophils Relative: 0.4 % (ref 0.0–3.0)
Eosinophils Absolute: 0.1 10*3/uL (ref 0.0–0.7)
Eosinophils Relative: 1 % (ref 0.0–5.0)
HCT: 43.3 % (ref 39.0–52.0)
Hemoglobin: 14.7 g/dL (ref 13.0–17.0)
Lymphocytes Relative: 28.4 % (ref 12.0–46.0)
Lymphs Abs: 1.6 10*3/uL (ref 0.7–4.0)
MCHC: 34 g/dL (ref 30.0–36.0)
MCV: 92.7 fl (ref 78.0–100.0)
Monocytes Absolute: 0.4 10*3/uL (ref 0.1–1.0)
Monocytes Relative: 7.4 % (ref 3.0–12.0)
Neutro Abs: 3.5 10*3/uL (ref 1.4–7.7)
Neutrophils Relative %: 62.8 % (ref 43.0–77.0)
Platelets: 269 10*3/uL (ref 150.0–400.0)
RBC: 4.67 Mil/uL (ref 4.22–5.81)
RDW: 14.2 % (ref 11.5–15.5)
WBC: 5.6 10*3/uL (ref 4.0–10.5)

## 2019-09-04 LAB — LIPID PANEL
Cholesterol: 179 mg/dL (ref 0–200)
HDL: 49.9 mg/dL (ref >39.00)
LDL Cholesterol: 115 mg/dL — ABNORMAL HIGH (ref 0–99)
NonHDL: 129.16
Total CHOL/HDL Ratio: 4
Triglycerides: 73 mg/dL (ref 0.0–149.0)
VLDL: 14.6 mg/dL (ref 0.0–40.0)

## 2019-09-04 LAB — IBC + FERRITIN
Ferritin: 383.4 ng/mL — ABNORMAL HIGH (ref 22.0–322.0)
Iron: 86 ug/dL (ref 42–165)
Saturation Ratios: 26.5 % (ref 20.0–50.0)
Transferrin: 232 mg/dL (ref 212.0–360.0)

## 2019-09-04 LAB — TSH: TSH: 0.94 u[IU]/mL (ref 0.35–4.50)

## 2019-09-04 LAB — PSA: PSA: 1.14 ng/mL (ref 0.10–4.00)

## 2019-09-04 NOTE — Patient Instructions (Addendum)
It was great seeing you today   We will follow up with you regarding your blood work   Dr. Daryll Brod  [phone]

## 2019-09-04 NOTE — Addendum Note (Signed)
Addended by: Miles Costain T on: 09/04/2019 02:42 PM   Modules accepted: Orders

## 2019-09-04 NOTE — Progress Notes (Signed)
Subjective:    Patient ID: Jesse Arroyo, male    DOB: Nov 16, 1961, 58 y.o.   MRN: [id number]  HPI  Patient presents for yearly preventative medicine examination. He is a pleasant 58 year old male who  has a past medical history of Allergy, Colonic mass, Deviated nasal septum (01/14/14), Hypertension, and Skin abnormalities.  Essential Hypertension - Takes senna Prill/hydrochlorothiazide 20-12.5 mg.  He denies dizziness, lightheadedness, chest pain, shortness of breath, or headaches. BP Readings from Last 3 Encounters:  09/04/19 132/74  10/02/18 128/68  09/07/18 110/62   Fatigue - was last seen in August 2019 for this issue. His lab work came back negative. He was referred to Pulmonary for sleep study but refused to go through with this. He continues to feel fatigued but less so then he did last year. States " I just don't have a pep in my step like I used to." He also reports intermittent episodes of ED. He denies CP, SOB, depression, or anxiety.   All immunizations and health maintenance protocols were reviewed with the patient and needed orders were placed. Due for influenza vaccination   Appropriate screening laboratory values were ordered for the patient including screening of hyperlipidemia, renal function and hepatic function. If indicated by BPH, a PSA was ordered.  Medication reconciliation,  past medical history, social history, problem list and allergies were reviewed in detail with the patient  Goals were established with regard to weight loss, exercise, and  diet in compliance with medications. He is eating a healthy diet and is staying active.   Wt Readings from Last 3 Encounters:  09/04/19 187 lb (84.8 kg)  10/02/18 188 lb 6.4 oz (85.5 kg)  09/07/18 188 lb 12.8 oz (85.6 kg)   End of life planning was discussed.  He is due for routine screening colonoscopy in December 2020.  Is up-to-date on routine dental and vision screens   Review of Systems  Constitutional:  Positive for fatigue.  HENT: Negative.   Eyes: Negative.   Respiratory: Negative.   Cardiovascular: Negative.   Gastrointestinal: Negative.   Endocrine: Negative.   Genitourinary: Negative.   Musculoskeletal: Negative.   Skin: Negative.   Allergic/Immunologic: Negative.   Neurological: Negative.   Hematological: Negative.   Psychiatric/Behavioral: Negative.   All other systems reviewed and are negative.  Past Medical History:  Diagnosis Date  . Allergy   . Colonic mass   . Deviated nasal septum 01/14/14   per pt  . Hypertension   . Skin abnormalities    feet    Social History   Socioeconomic History  . Marital status: Married    Spouse name: Not on file  . Number of children: 2  . Years of education: Not on file  . Highest education level: Not on file  Occupational History  . Occupation: Chartered loss adjuster: [employer]  Social Needs  . Financial resource strain: Not on file  . Food insecurity    Worry: Not on file    Inability: Not on file  . Transportation needs    Medical: Not on file    Non-medical: Not on file  Tobacco Use  . Smoking status: Former Smoker    Types: Cigarettes    Quit date: 12/27/1993    Years since quitting: 25.7  . Smokeless tobacco: Former Systems developer    Types: Snuff    Quit date: 12/28/2007  Substance and Sexual Activity  . Alcohol use: Yes    Alcohol/week:  4.0 standard drinks    Types: 4 Glasses of wine per week  . Drug use: No  . Sexual activity: Not on file  Lifestyle  . Physical activity    Days per week: Not on file    Minutes per session: Not on file  . Stress: Not on file  Relationships  . Social Herbalist on phone: Not on file    Gets together: Not on file    Attends religious service: Not on file    Active member of club or organization: Not on file    Attends meetings of clubs or organizations: Not on file    Relationship status: Not on file  . Intimate partner violence    Fear of  current or ex partner: Not on file    Emotionally abused: Not on file    Physically abused: Not on file    Forced sexual activity: Not on file  Other Topics Concern  . Not on file  Social History Narrative   Married   Two daughters - One lives at home.        Past Surgical History:  Procedure Laterality Date  . COLON RESECTION  2013  . COLONOSCOPY    . HEMICOLECTOMY  05/10/12  . INSERTION OF MESH N/A 01/23/2014   Procedure: OPEN INSERTION OF MESH;  Surgeon: Stark Klein, MD;  Location: [facility];  Service: General;  Laterality: N/A;  . VASECTOMY    . VENTRAL HERNIA REPAIR N/A 01/23/2014   Procedure: LAPAROSCOPIC VENTRAL HERNIA REPAIR;  Surgeon: Stark Klein, MD;  Location: [facility];  Service: General;  Laterality: N/A;  . WISDOM TOOTH EXTRACTION      Family History  Problem Relation Age of Onset  . Lung cancer Father 44  . Hypertension Mother   . Hypertension Other   . Colon cancer Neg Hx   . Rectal cancer Neg Hx   . Stomach cancer Neg Hx   . Pancreatic cancer Neg Hx   . Colon polyps Neg Hx     No Known Allergies  Current Outpatient Medications on File Prior to Visit  Medication Sig Dispense Refill  . aspirin 81 MG tablet Take 81 mg by mouth daily with breakfast.     . lisinopril-hydrochlorothiazide (ZESTORETIC) 20-12.5 MG tablet Take 1 tablet by mouth daily. 90 tablet 0  . Multiple Vitamin (MULTIVITAMIN) tablet Take 1 tablet by mouth daily with breakfast.     . sildenafil (VIAGRA) 100 MG tablet Take 0.5-1 tablets (50-100 mg total) by mouth daily as needed for erectile dysfunction. 10 tablet 11   No current facility-administered medications on file prior to visit.     BP 132/74   Temp 98.1 F (36.7 C) (Temporal)   Ht 6' 0.5" (1.842 m)   Wt 187 lb (84.8 kg)   BMI 25.01 kg/m       Objective:   Physical Exam Vitals signs and nursing note reviewed.  Constitutional:      General: He is not in acute distress.    Appearance: Normal appearance. He is normal weight. He  is not diaphoretic.  HENT:     Head: Normocephalic and atraumatic.     Right Ear: Tympanic membrane, ear canal and external ear normal. There is no impacted cerumen.     Left Ear: Tympanic membrane, ear canal and external ear normal. There is no impacted cerumen.     Nose: Nose normal. No congestion or rhinorrhea.     Mouth/Throat:  Mouth: Mucous membranes are moist.     Pharynx: Oropharynx is clear. No oropharyngeal exudate or posterior oropharyngeal erythema.  Eyes:     General: No scleral icterus.       Right eye: No discharge.        Left eye: No discharge.     Extraocular Movements: Extraocular movements intact.     Conjunctiva/sclera: Conjunctivae normal.     Pupils: Pupils are equal, round, and reactive to light.  Neck:     Musculoskeletal: Normal range of motion and neck supple.     Thyroid: No thyromegaly.     Vascular: No JVD.     Trachea: No tracheal deviation.  Cardiovascular:     Rate and Rhythm: Normal rate and regular rhythm.     Pulses: Normal pulses.     Heart sounds: Normal heart sounds. No murmur. No friction rub. No gallop.   Pulmonary:     Effort: Pulmonary effort is normal. No respiratory distress.     Breath sounds: Normal breath sounds. No stridor. No wheezing, rhonchi or rales.  Chest:     Chest wall: No tenderness.  Abdominal:     General: Bowel sounds are normal. There is no distension.     Palpations: Abdomen is soft. There is no mass.     Tenderness: There is no abdominal tenderness. There is no right CVA tenderness, left CVA tenderness, guarding or rebound.     Hernia: No hernia is present.  Musculoskeletal: Normal range of motion.        General: No tenderness or deformity.  Lymphadenopathy:     Cervical: No cervical adenopathy.  Skin:    General: Skin is warm and dry.     Capillary Refill: Capillary refill takes less than 2 seconds.     Coloration: Skin is not jaundiced or pale.     Findings: No bruising, erythema, lesion or rash.   Neurological:     General: No focal deficit present.     Mental Status: He is alert and oriented to person, place, and time.     Cranial Nerves: No cranial nerve deficit.     Sensory: No sensory deficit.     Motor: No weakness or abnormal muscle tone.     Coordination: Coordination normal.     Gait: Gait normal.     Deep Tendon Reflexes: Reflexes are normal and symmetric. Reflexes normal.  Psychiatric:        Mood and Affect: Mood normal.        Behavior: Behavior normal.        Thought Content: Thought content normal.        Judgment: Judgment normal.        Assessment & Plan:  1. Routine general medical examination at a health care facility - Follow up in one year or sooner if needed - Continue to eat a heart healthy exercise and stay active  - CBC with Differential/Platelet - Comprehensive metabolic panel - Lipid panel - TSH  2. Essential hypertension - No change in medication at this time  - CBC with Differential/Platelet - Comprehensive metabolic panel - Lipid panel - TSH  3. Prostate cancer screening  - PSA  4. Fatigue, unspecified type - unknown cause. Will add on iron and testosterone panel today. Likely some degree of sleep apnea - CBC with Differential/Platelet - Comprehensive metabolic panel - Lipid panel - TSH - Testosterone Total,Free,Bio, Males - IBC + Ferritin  [name], NP

## 2019-09-05 LAB — TESTOSTERONE TOTAL,FREE,BIO, MALES
Albumin: 4.4 g/dL (ref 3.6–5.1)
Sex Hormone Binding: 36 nmol/L (ref 22–77)
Testosterone, Bioavailable: 136.6 ng/dL (ref >=110.0)
Testosterone, Free: 67.8 pg/mL (ref 46.0–224.0)
Testosterone: 532 ng/dL (ref 250–827)

## 2019-12-17 ENCOUNTER — Other Ambulatory Visit: Payer: Self-pay | Admitting: Adult Health

## 2019-12-19 NOTE — Telephone Encounter (Signed)
Sent to the pharmacy by e-scribe. 

## 2020-06-22 ENCOUNTER — Emergency Department (HOSPITAL_COMMUNITY): Payer: BC Managed Care – PPO

## 2020-06-22 ENCOUNTER — Emergency Department (HOSPITAL_COMMUNITY)
Admission: EM | Admit: 2020-06-22 | Discharge: 2020-06-22 | Disposition: A | Discharge status: Home / Self Care | Payer: BC Managed Care – PPO | Attending: Emergency Medicine | Admitting: Emergency Medicine

## 2020-06-22 ENCOUNTER — Other Ambulatory Visit: Payer: Self-pay

## 2020-06-22 ENCOUNTER — Encounter (HOSPITAL_COMMUNITY): Payer: Self-pay | Admitting: Emergency Medicine

## 2020-06-22 DIAGNOSIS — Z7982 Long term (current) use of aspirin: Secondary | ICD-10-CM | POA: Insufficient documentation

## 2020-06-22 DIAGNOSIS — Z79899 Other long term (current) drug therapy: Secondary | ICD-10-CM | POA: Insufficient documentation

## 2020-06-22 DIAGNOSIS — Z87891 Personal history of nicotine dependence: Secondary | ICD-10-CM | POA: Diagnosis not present

## 2020-06-22 DIAGNOSIS — I10 Essential (primary) hypertension: Secondary | ICD-10-CM | POA: Insufficient documentation

## 2020-06-22 DIAGNOSIS — R1033 Periumbilical pain: Secondary | ICD-10-CM | POA: Insufficient documentation

## 2020-06-22 DIAGNOSIS — K6389 Other specified diseases of intestine: Secondary | ICD-10-CM | POA: Diagnosis not present

## 2020-06-22 DIAGNOSIS — K439 Ventral hernia without obstruction or gangrene: Secondary | ICD-10-CM | POA: Diagnosis not present

## 2020-06-22 DIAGNOSIS — K469 Unspecified abdominal hernia without obstruction or gangrene: Secondary | ICD-10-CM | POA: Diagnosis not present

## 2020-06-22 DIAGNOSIS — I700 Atherosclerosis of aorta: Secondary | ICD-10-CM | POA: Diagnosis not present

## 2020-06-22 LAB — CBC
HCT: 45.3 % (ref 39.0–52.0)
Hemoglobin: 14.7 g/dL (ref 13.0–17.0)
MCH: 29.5 pg (ref 26.0–34.0)
MCHC: 32.5 g/dL (ref 30.0–36.0)
MCV: 91 fL (ref 80.0–100.0)
Platelets: 237 10*3/uL (ref 150–400)
RBC: 4.98 MIL/uL (ref 4.22–5.81)
RDW: 13.9 % (ref 11.5–15.5)
WBC: 7 10*3/uL (ref 4.0–10.5)
nRBC: 0 % (ref 0.0–0.2)

## 2020-06-22 LAB — URINALYSIS, ROUTINE W REFLEX MICROSCOPIC
Bilirubin Urine: NEGATIVE
Glucose, UA: NEGATIVE mg/dL
Hgb urine dipstick: NEGATIVE
Ketones, ur: NEGATIVE mg/dL
Leukocytes,Ua: NEGATIVE
Nitrite: NEGATIVE
Protein, ur: NEGATIVE mg/dL
Specific Gravity, Urine: 1.005 (ref 1.005–1.030)
pH: 6 (ref 5.0–8.0)

## 2020-06-22 LAB — COMPREHENSIVE METABOLIC PANEL
ALT: 29 U/L (ref 0–44)
AST: 22 U/L (ref 15–41)
Albumin: 4.4 g/dL (ref 3.5–5.0)
Alkaline Phosphatase: 42 U/L (ref 38–126)
Anion gap: 7 (ref 5–15)
BUN: 16 mg/dL (ref 8–23)
CO2: 31 mmol/L (ref 22–32)
Calcium: 9.4 mg/dL (ref 8.9–10.3)
Chloride: 102 mmol/L (ref 98–111)
Creatinine, Ser: 1.06 mg/dL (ref 0.61–1.24)
GFR calc Af Amer: >60 mL/min (ref >60)
GFR calc non Af Amer: >60 mL/min (ref >60)
Glucose, Bld: 101 mg/dL — ABNORMAL HIGH (ref 70–99)
Potassium: 4.7 mmol/L (ref 3.5–5.1)
Sodium: 140 mmol/L (ref 135–145)
Total Bilirubin: 0.4 mg/dL (ref 0.3–1.2)
Total Protein: 7 g/dL (ref 6.5–8.1)

## 2020-06-22 LAB — LIPASE, BLOOD: Lipase: 47 U/L (ref 11–51)

## 2020-06-22 MED ORDER — IOHEXOL 300 MG/ML  SOLN
100.0000 mL | Freq: Once | INTRAMUSCULAR | Status: AC | PRN
  Administered 2020-06-22: 100 mL via INTRAVENOUS

## 2020-06-22 MED ORDER — DICYCLOMINE HCL 20 MG PO TABS: 10.0000 mg | ORAL_TABLET | Freq: Two times a day (BID) | ORAL | 0 refills | Status: DC | PRN

## 2020-06-22 MED ORDER — SODIUM CHLORIDE (PF) 0.9 % IJ SOLN
INTRAMUSCULAR | Status: AC
  Filled 2020-06-22: qty 50

## 2020-06-22 NOTE — ED Triage Notes (Signed)
Per pt, states he started having abdominal pain on Monday-states he has a history of hernia and colon surgery-states the only relief is when he gives himself an enema

## 2020-06-22 NOTE — ED Notes (Signed)
To CT

## 2020-06-22 NOTE — ED Provider Notes (Signed)
[facility] DEPT Provider Note   CSN: [id number] Arrival date & time: 06/22/20  1130     History Chief Complaint  Patient presents with  . Abdominal Pain    Jesse Arroyo is a 59 y.o. male.  HPI   Patient presents to emergency department with chief of complaint periumbilical pain that has been going on for 5 days.  Patient states Monday he was stretching out his back and he felt pain in his periumbilical which has been intermittent.  The pain is a cramp like pain and denies any alleviating or aggravating factors.  Patient also admits that his bowel movements have been less frequent but is passing gas.  Patient denies nausea, vomiting, fever, diarrhea.  Patient has significant medical history of hypertension and surgical history including hemicolectomy, ventral hernia repair.  Patient denies fever, chills, sore throat, cough, chest pain, shortness of breath, nausea, vomiting, diarrhea, urinary issues, pedal edema.  Past Medical History:  Diagnosis Date  . Allergy   . Colonic mass   . Deviated nasal septum 01/14/14   per pt  . Hypertension   . Skin abnormalities    feet    Patient Active Problem List   Diagnosis Date Noted  . Routine general medical examination at a health care facility 02/04/2015  . Tear of medial cartilage or meniscus of knee, current 03/12/2014  . Ventral hernia 01/23/2014  . Incisional hernia 01/02/2013  . Dysplastic nevi 07/31/2012  . Hematuria, gross 07/26/2012  . 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 Plantar fasciitis 01/20/2012  . ERECTILE DYSFUNCTION, ORGANIC 04/09/2010  . [place] DISEASE, LUMBAR 09/28/2008  . Essential hypertension 06/25/2008  . ALLERGIC RHINITIS 06/25/2008    Past Surgical History:  Procedure Laterality Date  . COLON RESECTION  2013  . COLONOSCOPY    . HEMICOLECTOMY  05/10/12  . INSERTION OF MESH N/A 01/23/2014   Procedure: OPEN INSERTION OF MESH;  Surgeon:  Stark Klein, MD;  Location: [facility];  Service: General;  Laterality: N/A;  . VASECTOMY    . VENTRAL HERNIA REPAIR N/A 01/23/2014   Procedure: LAPAROSCOPIC VENTRAL HERNIA REPAIR;  Surgeon: Stark Klein, MD;  Location: [facility];  Service: General;  Laterality: N/A;  . WISDOM TOOTH EXTRACTION         Family History  Problem Relation Age of Onset  . Lung cancer Father 23  . Hypertension Mother   . Hypertension Other   . Colon cancer Neg Hx   . Rectal cancer Neg Hx   . Stomach cancer Neg Hx   . Pancreatic cancer Neg Hx   . Colon polyps Neg Hx     Social History   Tobacco Use  . Smoking status: Former Smoker    Types: Cigarettes    Quit date: 12/27/1993    Years since quitting: 26.5  . Smokeless tobacco: Former Systems developer    Types: Snuff    Quit date: 12/28/2007  Vaping Use  . Vaping Use: Never used  Substance Use Topics  . Alcohol use: Yes    Alcohol/week: 4.0 standard drinks    Types: 4 Glasses of wine per week  . Drug use: No    Home Medications Prior to Admission medications   Medication Sig Start Date End Date Taking? Authorizing Provider  Ascorbic Acid (VITAMIN C PO) Take 1 tablet by mouth daily.   Yes [provider]  aspirin 81 MG tablet Take 81 mg by mouth daily with breakfast.  Yes [provider]  cholecalciferol (VITAMIN D3) 25 MCG (1000 UNIT) tablet Take 1,000 Units by mouth daily.   Yes [provider]  lisinopril-hydrochlorothiazide (ZESTORETIC) 20-12.5 MG tablet TAKE 1 TABLET BY MOUTH EVERY DAY 12/19/19  Yes Nafziger, Tommi Rumps, NP  Multiple Vitamin (MULTIVITAMIN) tablet Take 1 tablet by mouth daily with breakfast.    Yes [provider]  sildenafil (VIAGRA) 100 MG tablet Take 0.5-1 tablets (50-100 mg total) by mouth daily as needed for erectile dysfunction. 08/22/17  Yes Laurey Morale, MD  dicyclomine (BENTYL) 20 MG tablet Take 0.5 tablets (10 mg total) by mouth 2 (two) times daily as needed for up to 10 days for spasms. 06/22/20 07/02/20   Marcello Fennel, PA-C    Allergies    Patient has no known allergies.  Review of Systems   Review of Systems  Constitutional: Negative for chills and fever.  HENT: Negative for congestion, sore throat and tinnitus.   Respiratory: Negative for shortness of breath.   Cardiovascular: Negative for chest pain.  Gastrointestinal: Positive for abdominal pain. Negative for diarrhea, nausea and vomiting.       Admits to abdominal crampiness  Genitourinary: Negative for enuresis.  Musculoskeletal: Negative for back pain.  Skin: Negative for rash.  Neurological: Negative for dizziness.  Hematological: Does not bruise/bleed easily.    Physical Exam Updated Vital Signs BP 136/70 (BP Location: Right Arm)   Pulse 80   Temp 98 F (36.7 C) (Oral)   Resp 19   Ht 6' (1.829 m)   Wt 83.5 kg   SpO2 99%   BMI 24.95 kg/m   Physical Exam Vitals and nursing note reviewed.  Constitutional:      General: He is not in acute distress.    Appearance: He is not ill-appearing.  HENT:     Head: Normocephalic and atraumatic.     Nose: No congestion.     Mouth/Throat:     Mouth: Mucous membranes are moist.     Pharynx: Oropharynx is clear.  Eyes:     General: No scleral icterus. Cardiovascular:     Rate and Rhythm: Normal rate and regular rhythm.     Pulses: Normal pulses.     Heart sounds: No murmur heard.  No friction rub. No gallop.   Pulmonary:     Effort: No respiratory distress.     Breath sounds: No wheezing, rhonchi or rales.  Abdominal:     General: There is no distension.     Tenderness: There is no abdominal tenderness. There is no guarding.     Comments: Abdomen was visualized, had a small incision below his umbilicus, nondistended, normal active bowel sounds, dull to percussion, slight tender to palpation around his umbilicus does not radiate, negative Murphy sign, negative rebound tenderness, no peritoneal sign.  Musculoskeletal:        General: No swelling.  Skin:     General: Skin is warm and dry.     Findings: No rash.  Neurological:     Mental Status: He is alert.  Psychiatric:        Mood and Affect: Mood normal.     ED Results / Procedures / Treatments   Labs (all labs ordered are listed, but only abnormal results are displayed) Labs Reviewed  COMPREHENSIVE METABOLIC PANEL - Abnormal; Notable for the following components:      Result Value   Glucose, Bld 101 (*)    All other components within normal limits  URINALYSIS, ROUTINE W  REFLEX MICROSCOPIC - Abnormal; Notable for the following components:   Color, Urine STRAW (*)    All other components within normal limits  LIPASE, BLOOD  CBC    EKG None  Radiology CT Abdomen Pelvis W Contrast  Result Date: 06/22/2020 CLINICAL DATA:  Abdominal pain beginning 7 days ago. History of hernia and colon surgery. EXAM: CT ABDOMEN AND PELVIS WITH CONTRAST TECHNIQUE: Multidetector CT imaging of the abdomen and pelvis was performed using the standard protocol following bolus administration of intravenous contrast. CONTRAST:  179mL OMNIPAQUE IOHEXOL 300 MG/ML  SOLN COMPARISON:  CT, 09/05/2018. FINDINGS: Lower chest: Choose 1 Hepatobiliary: Small low-density liver lesions consistent with cysts. Several calcifications consistent with healed granuloma. No other liver abnormality. Normal gallbladder. No bile duct dilation. Pancreas: Unremarkable. No pancreatic ductal dilatation or surrounding inflammatory changes. Spleen: Several splenic calcifications consistent with healed granuloma. Otherwise normal. Adrenals/Urinary Tract: Adrenal glands are unremarkable. Kidneys are normal, without renal calculi, focal lesion, or hydronephrosis. Bladder is unremarkable. Stomach/Bowel: Status post partial right hemicolectomy, ileocolic anastomosis in the right mid abdomen. Small bowel and colon are normal in caliber. No wall thickening. No inflammation. Normal stomach. Vascular/Lymphatic: Minor aortic atherosclerotic CIS. No  aneurysm or other vascular abnormality. No enlarged lymph nodes. Reproductive: Unremarkable. Other: Changes from a previous midline hernia repair. No residual/recurrent hernia. No ascites. Musculoskeletal: No fracture or acute finding. No osteoblastic or osteolytic lesions. IMPRESSION: 1. No acute findings within the abdomen or pelvis. No bowel obstruction or bowel inflammation. 2. Stable postsurgical changes from a previous partial right hemicolectomy and anterior abdominal wall hernia repair. No recurrent or residual hernia. 3. Small low-density liver lesions consistent with cysts, stable. 4. Minor aortic atherosclerosis. Electronically Signed   By: Lajean Manes M.D.   On: 06/22/2020 15:44    Procedures Procedures (including critical care time)  Medications Ordered in ED Medications  sodium chloride (PF) 0.9 % injection (has no administration in time range)  iohexol (OMNIPAQUE) 300 MG/ML solution 100 mL (100 mLs Intravenous Contrast Given 06/22/20 1524)    ED Course  I have reviewed the triage vital signs and the nursing notes.  Pertinent labs & imaging results that were available during my care of the patient were reviewed by me and considered in my medical decision making (see chart for details).    MDM Rules/Calculators/A&P                          I have personally reviewed all imaging, labs and have interpreted them.  Due to patient's complaint most concern for small bowel obstruction versus abdominal abscess versus diverticulitis.  CT abdomen did not show any acute abnormalities normal postsurgical changes from previous partial right hemicolectomy and anterior abdominal wall repair making small bowel obstruction, abdominal abscess, diverticulitis unlikely.  Patient's CMP did not show electrolyte  abnormalities, no signs of AKI, normal liver enzymes.  Lipase was 47 making pancreatitis unlikely as he has low risk factors nondiabetic, no history of gallstones, does not drink alcohol.  UA  did not show any abnormalities, no nitrates or leukocytes making UTI/Pilo unlikely.  CBC did not show leukocytosis signs of anemia.  Patient appears to be resting comfortably in bed, showing no acute signs distress.  Vital signs have remained stable does not meet criteria to be admitted to the hospital.  Differential diagnosis for abdominal pain includes abdominal muscle wall strain versus constipation.  I recommend patient use MiraLAX regularly for the next 2 weeks and follows up  with his primary care doctor for reevaluation.  Patient was discussed with attending who agrees with with assessment and plan.  Patient was given at home care as well as strict return precautions.  Patient stated he understood and agree with said plan. Final Clinical Impression(s) / ED Diagnoses Final diagnoses:  Periumbilical abdominal pain    Rx / DC Orders ED Discharge Orders         Ordered    dicyclomine (BENTYL) 20 MG tablet  2 times daily PRN     Discontinue  Reprint     06/22/20 1607           Marcello Fennel, PA-C 06/22/20 1759    Lucrezia Starch, MD 06/23/20 1517

## 2020-06-22 NOTE — Discharge Instructions (Addendum)
You have been seen here for abdominal pain.  Imaging and labs look reassuring.  Recommend that you increase your water intake as well start to take MiraLAX daily for the next 2 to 3 weeks consistently.  Please follow dosaging on back of bottle.  I have also prescribed you Bentyl which can help with abdominal cramping please use as needed.  I want you to follow-up with your primary doctor in 2 to 3 weeks if pain persist.  You need to come back to the emergency department if you develop fever, chills, severe abdominal pain, uncontrolled nausea, vomiting, chest pain, shortness of breath of the symptoms require further evaluation and management.

## 2020-09-10 ENCOUNTER — Other Ambulatory Visit: Payer: Self-pay | Admitting: Adult Health

## 2020-09-24 ENCOUNTER — Ambulatory Visit (INDEPENDENT_AMBULATORY_CARE_PROVIDER_SITE_OTHER): Payer: BC Managed Care – PPO | Admitting: Adult Health

## 2020-09-24 ENCOUNTER — Encounter: Payer: Self-pay | Admitting: Adult Health

## 2020-09-24 ENCOUNTER — Other Ambulatory Visit: Payer: Self-pay

## 2020-09-24 VITALS — BP 132/88 | Temp 98.2°F | Wt 191.6 lb

## 2020-09-24 DIAGNOSIS — Z125 Encounter for screening for malignant neoplasm of prostate: Secondary | ICD-10-CM | POA: Diagnosis not present

## 2020-09-24 DIAGNOSIS — I10 Essential (primary) hypertension: Secondary | ICD-10-CM | POA: Diagnosis not present

## 2020-09-24 DIAGNOSIS — Z23 Encounter for immunization: Secondary | ICD-10-CM | POA: Diagnosis not present

## 2020-09-24 DIAGNOSIS — Z0000 Encounter for general adult medical examination without abnormal findings: Secondary | ICD-10-CM | POA: Diagnosis not present

## 2020-09-24 NOTE — Addendum Note (Signed)
Addended by: Westley Hummer B on: 09/24/2020 04:27 PM   Modules accepted: Orders

## 2020-09-24 NOTE — Patient Instructions (Addendum)
It was great seeing you today   We will follow up with you regarding your blood work   Please call [facility] to schedule your colonoscopy

## 2020-09-24 NOTE — Progress Notes (Signed)
Subjective:    Patient ID: Jesse Arroyo, male    DOB: 07-01-1961, 59 y.o.   MRN: [id number]  HPI Patient presents for yearly preventative medicine examination. He is a pleasant 59 year old male who  has a past medical history of Allergy, Colonic mass, Deviated nasal septum (01/14/14), Hypertension, and Skin abnormalities.  Essential Hypertension -currently prescribed  lisinopril/hydrochlorothiazide 20-12.5 mg.  He denies dizziness, lightheadedness, chest pain, shortness of breath, or headaches  BP Readings from Last 3 Encounters:  09/24/20 132/88  06/22/20 136/70  09/04/19 132/74     All immunizations and health maintenance protocols were reviewed with the patient and needed orders were placed.  Appropriate screening laboratory values were ordered for the patient including screening of hyperlipidemia, renal function and hepatic function. If indicated by BPH, a PSA was ordered.  Medication reconciliation,  past medical history, social history, problem list and allergies were reviewed in detail with the patient  Goals were established with regard to weight loss, exercise, and  diet in compliance with medications  Wt Readings from Last 3 Encounters:  09/24/20 191 lb 9.6 oz (86.9 kg)  06/22/20 184 lb (83.5 kg)  09/04/19 187 lb (84.8 kg)    He is overdue for his 3 colonoscopy- he will call over and schedule.   Review of Systems  Constitutional: Negative.   HENT: Negative.   Eyes: Negative.   Respiratory: Negative.   Cardiovascular: Negative.   Gastrointestinal: Negative.   Endocrine: Negative.   Genitourinary: Negative.   Musculoskeletal: Negative.   Skin: Negative.   Allergic/Immunologic: Negative.   Neurological: Negative.   Hematological: Negative.   Psychiatric/Behavioral: Negative.   All other systems reviewed and are negative.  Past Medical History:  Diagnosis Date  . Allergy   . Colonic mass   . Deviated nasal septum 01/14/14   per pt  . Hypertension   .  Skin abnormalities    feet    Social History   Socioeconomic History  . Marital status: Married    Spouse name: Not on file  . Number of children: 2  . Years of education: Not on file  . Highest education level: Not on file  Occupational History  . Occupation: Chartered loss adjuster: [employer]  Tobacco Use  . Smoking status: Former Smoker    Types: Cigarettes    Quit date: 12/27/1993    Years since quitting: 26.7  . Smokeless tobacco: Former Systems developer    Types: Snuff    Quit date: 12/28/2007  Vaping Use  . Vaping Use: Never used  Substance and Sexual Activity  . Alcohol use: Yes    Alcohol/week: 4.0 standard drinks    Types: 4 Glasses of wine per week  . Drug use: No  . Sexual activity: Not on file  Other Topics Concern  . Not on file  Social History Narrative   Married   Two daughters - One lives at home.       Social Determinants of Health   Financial Resource Strain:   . Difficulty of Paying Living Expenses: Not on file  Food Insecurity:   . Worried About Charity fundraiser in the Last Year: Not on file  . Ran Out of Food in the Last Year: Not on file  Transportation Needs:   . Lack of Transportation (Medical): Not on file  . Lack of Transportation (Non-Medical): Not on file  Physical Activity:   . Days of Exercise per Week: Not on  file  . Minutes of Exercise per Session: Not on file  Stress:   . Feeling of Stress : Not on file  Social Connections:   . Frequency of Communication with Friends and Family: Not on file  . Frequency of Social Gatherings with Friends and Family: Not on file  . Attends Religious Services: Not on file  . Active Member of Clubs or Organizations: Not on file  . Attends Archivist Meetings: Not on file  . Marital Status: Not on file  Intimate Partner Violence:   . Fear of Current or Ex-Partner: Not on file  . Emotionally Abused: Not on file  . Physically Abused: Not on file  . Sexually Abused: Not on  file    Past Surgical History:  Procedure Laterality Date  . COLON RESECTION  2013  . COLONOSCOPY    . HEMICOLECTOMY  05/10/12  . INSERTION OF MESH N/A 01/23/2014   Procedure: OPEN INSERTION OF MESH;  Surgeon: Stark Klein, MD;  Location: [facility];  Service: General;  Laterality: N/A;  . VASECTOMY    . VENTRAL HERNIA REPAIR N/A 01/23/2014   Procedure: LAPAROSCOPIC VENTRAL HERNIA REPAIR;  Surgeon: Stark Klein, MD;  Location: [facility];  Service: General;  Laterality: N/A;  . WISDOM TOOTH EXTRACTION      Family History  Problem Relation Age of Onset  . Lung cancer Father 47  . Hypertension Mother   . Hypertension Other   . Colon cancer Neg Hx   . Rectal cancer Neg Hx   . Stomach cancer Neg Hx   . Pancreatic cancer Neg Hx   . Colon polyps Neg Hx     No Known Allergies  Current Outpatient Medications on File Prior to Visit  Medication Sig Dispense Refill  . Ascorbic Acid (VITAMIN C PO) Take 1 tablet by mouth daily.    Marland Kitchen aspirin 81 MG tablet Take 81 mg by mouth daily with breakfast.     . cholecalciferol (VITAMIN D3) 25 MCG (1000 UNIT) tablet Take 1,000 Units by mouth daily.    Marland Kitchen dicyclomine (BENTYL) 20 MG tablet Take 0.5 tablets (10 mg total) by mouth 2 (two) times daily as needed for up to 10 days for spasms. 10 tablet 0  . lisinopril-hydrochlorothiazide (ZESTORETIC) 20-12.5 MG tablet TAKE 1 TABLET BY MOUTH EVERY DAY 90 tablet 1  . Multiple Vitamin (MULTIVITAMIN) tablet Take 1 tablet by mouth daily with breakfast.     . sildenafil (VIAGRA) 100 MG tablet Take 0.5-1 tablets (50-100 mg total) by mouth daily as needed for erectile dysfunction. 10 tablet 11   No current facility-administered medications on file prior to visit.    BP 132/88   Temp 98.2 F (36.8 C)   Wt 191 lb 9.6 oz (86.9 kg)   BMI 25.99 kg/m       Objective:   Physical Exam Vitals and nursing note reviewed.  Constitutional:      General: He is not in acute distress.    Appearance: Normal appearance. He is  well-developed and normal weight.  HENT:     Head: Normocephalic and atraumatic.     Right Ear: Tympanic membrane, ear canal and external ear normal. There is no impacted cerumen.     Left Ear: Tympanic membrane, ear canal and external ear normal. There is no impacted cerumen.     Nose: Nose normal. No congestion or rhinorrhea.     Mouth/Throat:     Mouth: Mucous membranes are moist.     Pharynx:  Oropharynx is clear. No oropharyngeal exudate or posterior oropharyngeal erythema.  Eyes:     General:        Right eye: No discharge.        Left eye: No discharge.     Extraocular Movements: Extraocular movements intact.     Conjunctiva/sclera: Conjunctivae normal.     Pupils: Pupils are equal, round, and reactive to light.  Neck:     Vascular: No carotid bruit.     Trachea: No tracheal deviation.  Cardiovascular:     Rate and Rhythm: Normal rate and regular rhythm.     Pulses: Normal pulses.     Heart sounds: Normal heart sounds. No murmur heard.  No friction rub. No gallop.   Pulmonary:     Effort: Pulmonary effort is normal. No respiratory distress.     Breath sounds: Normal breath sounds. No stridor. No wheezing, rhonchi or rales.  Chest:     Chest wall: No tenderness.  Abdominal:     General: Bowel sounds are normal. There is no distension.     Palpations: Abdomen is soft. There is no mass.     Tenderness: There is no abdominal tenderness. There is no right CVA tenderness, left CVA tenderness, guarding or rebound.     Hernia: No hernia is present.  Musculoskeletal:        General: No swelling, tenderness, deformity or signs of injury. Normal range of motion.     Right lower leg: No edema.     Left lower leg: No edema.  Lymphadenopathy:     Cervical: No cervical adenopathy.  Skin:    General: Skin is warm and dry.     Capillary Refill: Capillary refill takes less than 2 seconds.     Coloration: Skin is not jaundiced or pale.     Findings: No bruising, erythema, lesion or  rash.  Neurological:     General: No focal deficit present.     Mental Status: He is alert and oriented to person, place, and time.     Cranial Nerves: No cranial nerve deficit.     Sensory: No sensory deficit.     Motor: No weakness.     Coordination: Coordination normal.     Gait: Gait normal.     Deep Tendon Reflexes: Reflexes normal.  Psychiatric:        Mood and Affect: Mood normal.        Behavior: Behavior normal.        Thought Content: Thought content normal.        Judgment: Judgment normal.       Assessment & Plan:  1. Routine general medical examination at a health care facility - Work on lifestyle modifications  - Call [facility] to schedule colonoscopy - Follow up in one year or sooner if needed - CBC with Differential/Platelet; Future - Lipid panel; Future - TSH; Future - COMPLETE METABOLIC PANEL WITH GFR; Future - CBC with Differential/Platelet - COMPLETE METABOLIC PANEL WITH GFR - Lipid panel - TSH  2. Essential hypertension - No change in medication at this time  - CBC with Differential/Platelet; Future - Lipid panel; Future - TSH; Future - COMPLETE METABOLIC PANEL WITH GFR; Future - CBC with Differential/Platelet - COMPLETE METABOLIC PANEL WITH GFR - Lipid panel - TSH  3. Prostate cancer screening  - PSA; Future - PSA   [name], NP

## 2020-09-25 LAB — PSA: PSA: 0.6 ng/mL (ref <=4.0)

## 2020-09-25 LAB — COMPLETE METABOLIC PANEL WITH GFR
AG Ratio: 1.8 (calc) (ref 1.0–2.5)
ALT: 21 U/L (ref 9–46)
AST: 20 U/L (ref 10–35)
Albumin: 4.5 g/dL (ref 3.6–5.1)
Alkaline phosphatase (APISO): 44 U/L (ref 35–144)
BUN: 14 mg/dL (ref 7–25)
CO2: 30 mmol/L (ref 20–32)
Calcium: 9.9 mg/dL (ref 8.6–10.3)
Chloride: 103 mmol/L (ref 98–110)
Creat: 0.93 mg/dL (ref 0.70–1.25)
GFR, Est African American: 102 mL/min/{1.73_m2} (ref >=60)
GFR, Est Non African American: 88 mL/min/{1.73_m2} (ref >=60)
Globulin: 2.5 g/dL (calc) (ref 1.9–3.7)
Glucose, Bld: 92 mg/dL (ref 65–99)
Potassium: 4.1 mmol/L (ref 3.5–5.3)
Sodium: 140 mmol/L (ref 135–146)
Total Bilirubin: 0.5 mg/dL (ref 0.2–1.2)
Total Protein: 7 g/dL (ref 6.1–8.1)

## 2020-09-25 LAB — CBC WITH DIFFERENTIAL/PLATELET
Absolute Monocytes: 482 cells/uL (ref 200–950)
Basophils Absolute: 44 cells/uL (ref 0–200)
Basophils Relative: 0.6 %
Eosinophils Absolute: 80 cells/uL (ref 15–500)
Eosinophils Relative: 1.1 %
HCT: 46 % (ref 38.5–50.0)
Hemoglobin: 15.5 g/dL (ref 13.2–17.1)
Lymphs Abs: 2110 cells/uL (ref 850–3900)
MCH: 30.8 pg (ref 27.0–33.0)
MCHC: 33.7 g/dL (ref 32.0–36.0)
MCV: 91.5 fL (ref 80.0–100.0)
MPV: 9.5 fL (ref 7.5–12.5)
Monocytes Relative: 6.6 %
Neutro Abs: 4584 cells/uL (ref 1500–7800)
Neutrophils Relative %: 62.8 %
Platelets: 246 10*3/uL (ref 140–400)
RBC: 5.03 10*6/uL (ref 4.20–5.80)
RDW: 13.7 % (ref 11.0–15.0)
Total Lymphocyte: 28.9 %
WBC: 7.3 10*3/uL (ref 3.8–10.8)

## 2020-09-25 LAB — TSH: TSH: 1.15 mIU/L (ref 0.40–4.50)

## 2020-09-25 LAB — LIPID PANEL
Cholesterol: 203 mg/dL — ABNORMAL HIGH (ref <200)
HDL: 46 mg/dL (ref >=40)
LDL Cholesterol (Calc): 127 mg/dL (calc) — ABNORMAL HIGH
Non-HDL Cholesterol (Calc): 157 mg/dL (calc) — ABNORMAL HIGH (ref <130)
Total CHOL/HDL Ratio: 4.4 (calc) (ref <5.0)
Triglycerides: 179 mg/dL — ABNORMAL HIGH (ref <150)

## 2020-09-25 MED ORDER — ROSUVASTATIN CALCIUM 5 MG PO TABS: 5.0000 mg | ORAL_TABLET | Freq: Every day | ORAL | 1 refills | Status: DC

## 2020-09-25 NOTE — Addendum Note (Signed)
Addended by: Agnes Lawrence on: 09/25/2020 09:32 AM   Modules accepted: Orders

## 2020-11-22 ENCOUNTER — Encounter: Payer: Self-pay | Admitting: Adult Health

## 2020-11-30 ENCOUNTER — Encounter: Payer: Self-pay | Admitting: Adult Health

## 2020-12-02 ENCOUNTER — Other Ambulatory Visit: Payer: Self-pay | Admitting: Adult Health

## 2021-01-07 ENCOUNTER — Telehealth: Payer: Self-pay | Admitting: Adult Health

## 2021-01-07 DIAGNOSIS — N529 Male erectile dysfunction, unspecified: Secondary | ICD-10-CM

## 2021-01-07 MED ORDER — SILDENAFIL CITRATE 100 MG PO TABS: 50.0000 mg | ORAL_TABLET | Freq: Every day | ORAL | 3 refills | Status: DC | PRN

## 2021-01-07 NOTE — Telephone Encounter (Signed)
Pt call and need a refill on  sildenafil (VIAGRA) 100 MG tablet sent to  [id number] - , [address] Phone:  [phone]  Fax:  [phone]

## 2021-01-07 NOTE — Telephone Encounter (Signed)
Last filled in 2017.  Please advise.

## 2021-01-27 ENCOUNTER — Encounter: Payer: Self-pay | Admitting: Gastroenterology

## 2021-01-27 ENCOUNTER — Other Ambulatory Visit: Payer: Self-pay | Admitting: Gastroenterology

## 2021-01-27 ENCOUNTER — Ambulatory Visit: Payer: BC Managed Care – PPO | Admitting: Gastroenterology

## 2021-01-27 VITALS — BP 130/80 | HR 66 | Ht 71.5 in | Wt 194.2 lb

## 2021-01-27 DIAGNOSIS — K5902 Outlet dysfunction constipation: Secondary | ICD-10-CM

## 2021-01-27 DIAGNOSIS — K649 Unspecified hemorrhoids: Secondary | ICD-10-CM

## 2021-01-27 DIAGNOSIS — Z86010 Personal history of colonic polyps: Secondary | ICD-10-CM | POA: Diagnosis not present

## 2021-01-27 MED ORDER — HYOSCYAMINE SULFATE SL 0.125 MG SL SUBL: SUBLINGUAL_TABLET | SUBLINGUAL | 3 refills | Status: DC

## 2021-01-27 MED ORDER — NA SULFATE-K SULFATE-MG SULF 17.5-3.13-1.6 GM/177ML PO SOLN: 345.0000 | Freq: Once | ORAL | 0 refills | Status: AC

## 2021-01-27 NOTE — Patient Instructions (Signed)
Take benefiber 1 tablespoon twice a day  Increase  Water 8-10 cups a day  Use step stool [facility] ) for daily defecation  Follow up in 6 months  You have been scheduled for a colonoscopy. Please follow written instructions given to you at your visit today.  Please pick up your prep supplies at the pharmacy within the next 1-3 days. If you use inhalers (even only as needed), please bring them with you on the day of your procedure.   Due to recent changes in healthcare laws, you may see the results of your imaging and laboratory studies on MyChart before your provider has had a chance to review them.  We understand that in some cases there may be results that are confusing or concerning to you. Not all laboratory results come back in the same time frame and the provider may be waiting for multiple results in order to interpret others.  Please give [place] 48 hours in order for your provider to thoroughly review all the results before contacting the office for clarification of your results.   If you are age 10 or older, your body mass index should be between 23-30. Your Body mass index is 26.71 kg/m. If this is out of the aforementioned range listed, please consider follow up with your Primary Care Provider.  If you are age 63 or younger, your body mass index should be between 19-25. Your Body mass index is 26.71 kg/m. If this is out of the aformentioned range listed, please consider follow up with your Primary Care Provider.    I appreciate the  opportunity to care for you  Thank You   Harl Bowie , MD

## 2021-01-27 NOTE — Progress Notes (Signed)
Jesse Arroyo    [id number]    1961/12/24  Primary Care Physician:Nafziger, Tommi Rumps, NP  Referring Physician: [name], NP [place],  [place] [address]   Chief complaint: H/o colon polyps, change in bowel caliber  HPI:  60 yr old very pleasant gentleman here to discuss surveillance colonoscopy.  He has noticed change in stool caliber in the past few years. He is only passing small flat pieces of stool. He has sensation of incomplete defecation with fragmentation of stool. Some times he has to have multiple bowel movements to completely evacuate, Denies any melena or rectal bleeding  Colonoscopy 03/23/2012: Tubulovillous appearing mass at ileocecal valve, normal terminal ileum  Colonoscopy 09/11/2103: s/p ileocecectomy for removal of 2cm sessile serrated polyp in the cecum. Normal.  Colonoscopy 12/17/2016: 17mm polyp (tubular adenoma) removed from transverse colon, internal hemorrhoids   Outpatient Encounter Medications as of 01/27/2021  Medication Sig  . Ascorbic Acid (VITAMIN C PO) Take 1 tablet by mouth daily.  Marland Kitchen aspirin 81 MG tablet Take 81 mg by mouth daily with breakfast.   . cholecalciferol (VITAMIN D3) 25 MCG (1000 UNIT) tablet Take 1,000 Units by mouth daily.  Marland Kitchen lisinopril-hydrochlorothiazide (ZESTORETIC) 20-12.5 MG tablet TAKE 1 TABLET BY MOUTH EVERY DAY  . Multiple Vitamin (MULTIVITAMIN) tablet Take 1 tablet by mouth daily with breakfast.   . rosuvastatin (CRESTOR) 5 MG tablet Take 1 tablet (5 mg total) by mouth daily.  . sildenafil (VIAGRA) 100 MG tablet Take 0.5-1 tablets (50-100 mg total) by mouth daily as needed for erectile dysfunction.  . dicyclomine (BENTYL) 20 MG tablet Take 0.5 tablets (10 mg total) by mouth 2 (two) times daily as needed for up to 10 days for spasms.   No facility-administered encounter medications on file as of 01/27/2021.    Allergies as of 01/27/2021  . (No Known Allergies)    Past Medical History:   Diagnosis Date  . Allergy   . Colonic mass   . Deviated nasal septum 01/14/14   per pt  . Hypertension   . Skin abnormalities    feet    Past Surgical History:  Procedure Laterality Date  . COLON RESECTION  2013  . COLONOSCOPY    . HEMICOLECTOMY  05/10/12  . INSERTION OF MESH N/A 01/23/2014   Procedure: OPEN INSERTION OF MESH;  Surgeon: Stark Klein, MD;  Location: [facility];  Service: General;  Laterality: N/A;  . VASECTOMY    . VENTRAL HERNIA REPAIR N/A 01/23/2014   Procedure: LAPAROSCOPIC VENTRAL HERNIA REPAIR;  Surgeon: Stark Klein, MD;  Location: [facility];  Service: General;  Laterality: N/A;  . WISDOM TOOTH EXTRACTION      Family History  Problem Relation Age of Onset  . Lung cancer Father 50  . Hypertension Mother   . Hypertension Other   . Colon cancer Neg Hx   . Rectal cancer Neg Hx   . Stomach cancer Neg Hx   . Pancreatic cancer Neg Hx   . Colon polyps Neg Hx     Social History   Socioeconomic History  . Marital status: Married    Spouse name: Not on file  . Number of children: 2  . Years of education: Not on file  . Highest education level: Not on file  Occupational History  . Occupation: Chartered loss adjuster: [employer]  Tobacco Use  . Smoking status: Former Smoker    Types:  Cigarettes    Quit date: 12/27/1993    Years since quitting: 27.1  . Smokeless tobacco: Former Systems developer    Types: Snuff    Quit date: 12/28/2007  Vaping Use  . Vaping Use: Never used  Substance and Sexual Activity  . Alcohol use: Yes    Alcohol/week: 4.0 standard drinks    Types: 4 Glasses of wine per week  . Drug use: No  . Sexual activity: Not on file  Other Topics Concern  . Not on file  Social History Narrative   Married   Two daughters - One lives at home.       Social Determinants of Health   Financial Resource Strain: Not on file  Food Insecurity: Not on file  Transportation Needs: Not on file  Physical Activity: Not on file  Stress: Not on  file  Social Connections: Not on file  Intimate Partner Violence: Not on file      Review of systems: All other review of systems negative except as mentioned in the HPI.   Physical Exam: Vitals:   01/27/21 0808  BP: 130/80  Pulse: 66   Body mass index is 26.71 kg/m. Gen:      No acute distress HEENT:  sclera anicteric Abd:      soft, non-tender; no palpable masses, no distension Ext:    No edema Neuro: alert and oriented x 3 Psych: normal mood and affect  Data Reviewed:  Reviewed labs, radiology imaging, old records and pertinent past GI work up   Assessment and Plan/Recommendations: 25 yr very pleasant gentleman here to discuss surveillance colonoscopy.  H/o advanced tubulovillous adenoma s/p resection. We will plan to proceed with surveillance colonoscopy The risks and benefits as well as alternatives of endoscopic procedure(s) have been discussed and reviewed. All questions answered. The patient agrees to proceed.   Complains of recent change in bowel habits and difficulty evacuating, possible dyssynergic defecation/constipation with outlet dysfunction Start Benefiber 1 tablespoon twice daily with meals Use squatty potty during defecation Increase dietary fiber and water intake If continues to have persistent symptoms, will consider pelvic floor physical therapy for biofeedback  Return in 6 months or sooner if needed  The risks and benefits as well as alternatives of endoscopic procedure(s) have been discussed and reviewed. All questions answered. The patient agrees to proceed.   This visit required 40 minutes of patient care (this includes precharting, chart review, review of results, face-to-face time used for counseling as well as treatment plan and follow-up. The patient was provided an opportunity to ask questions and all were answered. The patient agreed with the plan and demonstrated an understanding of the instructions.  Damaris Hippo , MD    CC:  [name], NP

## 2021-01-27 NOTE — Telephone Encounter (Signed)
As needed

## 2021-02-09 ENCOUNTER — Encounter: Payer: Self-pay | Admitting: Gastroenterology

## 2021-02-10 ENCOUNTER — Emergency Department (HOSPITAL_COMMUNITY): Payer: BC Managed Care – PPO

## 2021-02-10 ENCOUNTER — Encounter (HOSPITAL_COMMUNITY): Payer: Self-pay | Admitting: Emergency Medicine

## 2021-02-10 ENCOUNTER — Emergency Department (HOSPITAL_COMMUNITY)
Admission: EM | Admit: 2021-02-10 | Discharge: 2021-02-10 | Disposition: A | Discharge status: Home / Self Care | Payer: BC Managed Care – PPO | Attending: Emergency Medicine | Admitting: Emergency Medicine

## 2021-02-10 ENCOUNTER — Other Ambulatory Visit: Payer: Self-pay

## 2021-02-10 DIAGNOSIS — I10 Essential (primary) hypertension: Secondary | ICD-10-CM | POA: Diagnosis not present

## 2021-02-10 DIAGNOSIS — Z87891 Personal history of nicotine dependence: Secondary | ICD-10-CM | POA: Diagnosis not present

## 2021-02-10 DIAGNOSIS — R079 Chest pain, unspecified: Secondary | ICD-10-CM | POA: Diagnosis not present

## 2021-02-10 DIAGNOSIS — R0789 Other chest pain: Secondary | ICD-10-CM | POA: Diagnosis not present

## 2021-02-10 DIAGNOSIS — Z79899 Other long term (current) drug therapy: Secondary | ICD-10-CM | POA: Diagnosis not present

## 2021-02-10 DIAGNOSIS — R0602 Shortness of breath: Secondary | ICD-10-CM | POA: Diagnosis not present

## 2021-02-10 DIAGNOSIS — Z7982 Long term (current) use of aspirin: Secondary | ICD-10-CM | POA: Diagnosis not present

## 2021-02-10 DIAGNOSIS — R110 Nausea: Secondary | ICD-10-CM | POA: Insufficient documentation

## 2021-02-10 DIAGNOSIS — R000 Tachycardia, unspecified: Secondary | ICD-10-CM | POA: Diagnosis not present

## 2021-02-10 LAB — BASIC METABOLIC PANEL
Anion gap: 12 (ref 5–15)
BUN: 16 mg/dL (ref 8–23)
CO2: 24 mmol/L (ref 22–32)
Calcium: 9.6 mg/dL (ref 8.9–10.3)
Chloride: 105 mmol/L (ref 98–111)
Creatinine, Ser: 1 mg/dL (ref 0.61–1.24)
GFR, Estimated: >60 mL/min (ref >60)
Glucose, Bld: 81 mg/dL (ref 70–99)
Potassium: 4 mmol/L (ref 3.5–5.1)
Sodium: 141 mmol/L (ref 135–145)

## 2021-02-10 LAB — TROPONIN I (HIGH SENSITIVITY)
Troponin I (High Sensitivity): 3 ng/L (ref <18)
Troponin I (High Sensitivity): 3 ng/L (ref <18)

## 2021-02-10 LAB — CBC
HCT: 48.8 % (ref 39.0–52.0)
Hemoglobin: 15.8 g/dL (ref 13.0–17.0)
MCH: 30 pg (ref 26.0–34.0)
MCHC: 32.4 g/dL (ref 30.0–36.0)
MCV: 92.8 fL (ref 80.0–100.0)
Platelets: 238 10*3/uL (ref 150–400)
RBC: 5.26 MIL/uL (ref 4.22–5.81)
RDW: 13.7 % (ref 11.5–15.5)
WBC: 8.4 10*3/uL (ref 4.0–10.5)
nRBC: 0 % (ref 0.0–0.2)

## 2021-02-10 MED ORDER — LORAZEPAM 1 MG PO TABS
1.0000 mg | ORAL_TABLET | Freq: Once | ORAL | Status: AC
  Administered 2021-02-10: 1 mg via ORAL
  Filled 2021-02-10: qty 1

## 2021-02-10 NOTE — ED Notes (Signed)
Patient verbalizes understanding of discharge instructions. Opportunity for questioning and answers were provided. Armband removed by staff, pt discharged from ED via wheelchair.  

## 2021-02-10 NOTE — ED Triage Notes (Signed)
Pt to triage via [facility] from home.  Pt working at home on computer and started having tightness to center of chest that radiates to R shoulder with SOB and palpitations.  Took 324mg  ASA by EMS and 1 of his own baby ASA (4 total).  Pain now decreased to 1/10.  20g R AC.

## 2021-02-10 NOTE — Discharge Instructions (Addendum)
Follow-up with your primary care doctor to discuss further evaluation, such as possible stress test.  Return as needed for recurrent or worsening symptoms.

## 2021-02-10 NOTE — ED Provider Notes (Signed)
[facility] EMERGENCY DEPARTMENT Provider Note   CSN: [id number] Arrival date & time: 02/10/21  1515     History Chief Complaint  Patient presents with  . Chest Pain    Jesse Arroyo is a 60 y.o. male.  HPI  HPI: A 60 year old patient with a history of hypertension and hypercholesterolemia presents for evaluation of chest pain. Initial onset of pain was approximately 3-6 hours ago. The patient's chest pain is described as heaviness/pressure/tightness and is not worse with exertion. The patient complains of nausea. The patient's chest pain is middle- or left-sided, is not well-localized, is not sharp and does not radiate to the arms/jaw/neck. The patient denies diaphoresis. The patient has no history of stroke, has no history of peripheral artery disease, has not smoked in the past 90 days, denies any history of treated diabetes, has no relevant family history of coronary artery disease (first degree relative at less than age 70) and does not have an elevated BMI (>=30). Pt was on a zoom call for work and felt like his heart was racing.  He was getting short of breath.  He did have some tightness in his chest.  That started at 1:30 pm.  Co workers were concerned so they called EMS.  Pt took some asa and that helped.  The sx have resolved.  He still feels like his heart is pounding though.  Past Medical History:  Diagnosis Date  . Allergy   . Colonic mass   . Deviated nasal septum 01/14/14   per pt  . Hypertension   . Skin abnormalities    feet    Patient Active Problem List   Diagnosis Date Noted  . Routine general medical examination at a health care facility 02/04/2015  . Tear of medial cartilage or meniscus of knee, current 03/12/2014  . Ventral hernia 01/23/2014  . Incisional hernia 01/02/2013  . Dysplastic nevi 07/31/2012  . Hematuria, gross 07/26/2012  . 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 Plantar fasciitis  01/20/2012  . ERECTILE DYSFUNCTION, ORGANIC 04/09/2010  . [place] DISEASE, LUMBAR 09/28/2008  . Essential hypertension 06/25/2008  . ALLERGIC RHINITIS 06/25/2008    Past Surgical History:  Procedure Laterality Date  . COLON RESECTION  2013  . COLONOSCOPY    . HEMICOLECTOMY  05/10/12  . INSERTION OF MESH N/A 01/23/2014   Procedure: OPEN INSERTION OF MESH;  Surgeon: Stark Klein, MD;  Location: [facility];  Service: General;  Laterality: N/A;  . VASECTOMY    . VENTRAL HERNIA REPAIR N/A 01/23/2014   Procedure: LAPAROSCOPIC VENTRAL HERNIA REPAIR;  Surgeon: Stark Klein, MD;  Location: [facility];  Service: General;  Laterality: N/A;  . WISDOM TOOTH EXTRACTION         Family History  Problem Relation Age of Onset  . Lung cancer Father 40  . Hypertension Mother   . Hypertension Other   . Colon cancer Neg Hx   . Rectal cancer Neg Hx   . Stomach cancer Neg Hx   . Pancreatic cancer Neg Hx   . Colon polyps Neg Hx     Social History   Tobacco Use  . Smoking status: Former Smoker    Types: Cigarettes    Quit date: 12/27/1993    Years since quitting: 27.1  . Smokeless tobacco: Former Systems developer    Types: Snuff    Quit date: 12/28/2007  Vaping Use  . Vaping Use: Never used  Substance Use Topics  .  Alcohol use: Yes    Alcohol/week: 4.0 standard drinks    Types: 4 Glasses of wine per week  . Drug use: No    Home Medications Prior to Admission medications   Medication Sig Start Date End Date Taking? Authorizing Provider  Ascorbic Acid (VITAMIN C PO) Take 1 tablet by mouth daily.    [provider]  aspirin 81 MG tablet Take 81 mg by mouth daily with breakfast.     [provider]  cholecalciferol (VITAMIN D3) 25 MCG (1000 UNIT) tablet Take 1,000 Units by mouth daily.    [provider]  dicyclomine (BENTYL) 10 MG capsule Take 1 capsule (10 mg total) by mouth 4 (four) times daily -  before meals and at bedtime. 01/27/21   Mauri Pole, MD  dicyclomine (BENTYL) 20 MG  tablet Take 0.5 tablets (10 mg total) by mouth 2 (two) times daily as needed for up to 10 days for spasms. 06/22/20 07/02/20  Marcello Fennel, PA-C  lisinopril-hydrochlorothiazide (ZESTORETIC) 20-12.5 MG tablet TAKE 1 TABLET BY MOUTH EVERY DAY 09/10/20   Nafziger, Tommi Rumps, NP  Multiple Vitamin (MULTIVITAMIN) tablet Take 1 tablet by mouth daily with breakfast.     [provider]  rosuvastatin (CRESTOR) 5 MG tablet Take 1 tablet (5 mg total) by mouth daily. 09/25/20   Nafziger, Tommi Rumps, NP  sildenafil (VIAGRA) 100 MG tablet Take 0.5-1 tablets (50-100 mg total) by mouth daily as needed for erectile dysfunction. 01/07/21   [name], NP    Allergies    Patient has no known allergies.  Review of Systems   Review of Systems  All other systems reviewed and are negative.   Physical Exam Updated Vital Signs BP 108/67   Pulse 66   Temp 98.3 F (36.8 C) (Oral)   Resp 15   SpO2 96%   Physical Exam  ED Results / Procedures / Treatments   Labs (all labs ordered are listed, but only abnormal results are displayed) Labs Reviewed  BASIC METABOLIC PANEL  CBC  TROPONIN I (HIGH SENSITIVITY)  TROPONIN I (HIGH SENSITIVITY)    EKG EKG Interpretation  Date/Time:  Tuesday February 10 2021 15:20:39 EST Ventricular Rate:  87 PR Interval:  158 QRS Duration: 100 QT Interval:  376 QTC Calculation: 452 R Axis:   71 Text Interpretation: Sinus rhythm with sinus arrhythmia with occasional Premature ventricular complexes Otherwise normal ECG pvc is new since last tracing Confirmed by Dorie Rank [phone]) on 02/10/2021 4:55:14 PM   Radiology DG Chest 2 View  Result Date: 02/10/2021 CLINICAL DATA:  Chest pain radiating to right side for several months EXAM: CHEST - 2 VIEW COMPARISON:  08/25/2018 FINDINGS: Frontal and lateral views of the chest demonstrate an unremarkable cardiac silhouette. No airspace disease, effusion, or pneumothorax. No acute bony abnormalities. IMPRESSION: 1. No acute  intrathoracic process. Electronically Signed   By: Randa Ngo M.D.   On: 02/10/2021 16:41    Procedures Procedures   Medications Ordered in ED Medications  LORazepam (ATIVAN) tablet 1 mg (1 mg Oral Given 02/10/21 1723)    ED Course  I have reviewed the triage vital signs and the nursing notes.  Pertinent labs & imaging results that were available during my care of the patient were reviewed by me and considered in my medical decision making (see chart for details).  Clinical Course as of 02/10/21 2019  Tue Feb 10, 2021  1920 Labs reviewed.  CBC metabolic panel normal.  Serial troponins normal [JK]  2017  While I was at the bedside the patient indicated earlier that his heart was pounding.  He was noted to have a normal sinus rhythm on the monitor.  Patient was given a dose of Ativan as he states he had been feeling very stressed [JK]    Clinical Course User Index [JK] Dorie Rank, MD   MDM Rules/Calculators/A&P HEAR Score: 3                        Patient presented to the ED for evaluation of chest pain.  Patient indicates symptoms started while he was at work.  He has been under a lot of stress.  Patient has some persistent pounding sensation in his chest while he was in the ED.  No tachycardia was noted.  Normal heart rhythm.  Patient's moderate risk heart score.  Serial troponins are normal.  Patient felt better after dose of Ativan.  He does have cardiac risk factors.  I think he would benefit from outpatient stress test but at this time I doubt acute coronary syndrome.  It is possible symptoms may have been related to anxiety and stress.  Discussed warning signs and precautions with patient and his wife.  Close outpatient follow-up. Final Clinical Impression(s) / ED Diagnoses Final diagnoses:  Chest pain, unspecified type    Rx / DC Orders ED Discharge Orders    None       Dorie Rank, MD 02/10/21 2019

## 2021-02-10 NOTE — ED Notes (Signed)
Provider at bedside

## 2021-02-12 ENCOUNTER — Encounter: Payer: Self-pay | Admitting: Gastroenterology

## 2021-02-19 ENCOUNTER — Encounter: Payer: Self-pay | Admitting: Gastroenterology

## 2021-02-19 ENCOUNTER — Other Ambulatory Visit: Payer: Self-pay

## 2021-02-19 ENCOUNTER — Ambulatory Visit (AMBULATORY_SURGERY_CENTER): Payer: BC Managed Care – PPO | Admitting: Gastroenterology

## 2021-02-19 VITALS — BP 120/81 | HR 84 | Temp 98.6°F | Resp 14 | Ht 71.0 in | Wt 194.0 lb

## 2021-02-19 DIAGNOSIS — Z86010 Personal history of colonic polyps: Secondary | ICD-10-CM | POA: Diagnosis not present

## 2021-02-19 MED ORDER — SODIUM CHLORIDE 0.9 % IV SOLN: 500.0000 mL | Freq: Once | INTRAVENOUS | Status: DC

## 2021-02-19 NOTE — Progress Notes (Signed)
pt tolerated well. VSS. awake and to recovery. Report given to RN.  

## 2021-02-19 NOTE — Op Note (Signed)
[facility] Patient Name: Jesse Arroyo Procedure Date: 02/19/2021 11:23 AM MRN: [id number] Endoscopist: Mauri Pole , MD Age: 60 Referring MD:  Date of Birth: Apr 22, 1961 Gender: Male Account #: [id number] Procedure:                Colonoscopy Indications:              High risk colon cancer surveillance: Personal                            history of colonic polyps, High risk colon cancer                            surveillance: Personal history of adenoma (10 mm or                            greater in size), High risk colon cancer                            surveillance: Personal history of adenoma with                            villous component Medicines:                Monitored Anesthesia Care Procedure:                Pre-Anesthesia Assessment:                           - Prior to the procedure, a History and Physical                            was performed, and patient medications and                            allergies were reviewed. The patient's tolerance of                            previous anesthesia was also reviewed. The risks                            and benefits of the procedure and the sedation                            options and risks were discussed with the patient.                            All questions were answered, and informed consent                            was obtained. Prior Anticoagulants: The patient has                            taken no previous anticoagulant or antiplatelet  agents. ASA Grade Assessment: II - A patient with                            mild systemic disease. After reviewing the risks                            and benefits, the patient was deemed in                            satisfactory condition to undergo the procedure.                           After obtaining informed consent, the colonoscope                            was passed under direct vision. Throughout the                             procedure, the patient's blood pressure, pulse, and                            oxygen saturations were monitored continuously. The                            Olympus [id number]-[id number] (#[id number]) Colonoscope was                            introduced through the anus and advanced to the the                            ileocolonic anastomosis. The colonoscopy was                            performed without difficulty. The patient tolerated                            the procedure well. The quality of the bowel                            preparation was adequate. The terminal ileum, the                            appendiceal orifice and the rectum were                            photographed. Scope In: 11:35:48 AM Scope Out: 11:53:24 AM Scope Withdrawal Time: 0 hours 12 minutes 29 seconds  Total Procedure Duration: 0 hours 17 minutes 36 seconds  Findings:                 The perianal and digital rectal examinations were                            normal.  There was evidence of a prior end-to-side                            ileo-colonic anastomosis in the cecum. This was                            patent and was characterized by healthy appearing                            mucosa.                           Non-bleeding internal hemorrhoids were found during                            retroflexion. The hemorrhoids were medium-sized.                           The exam was otherwise without abnormality. Complications:            No immediate complications. Estimated Blood Loss:     Estimated blood loss was minimal. Impression:               - Patent end-to-side ileo-colonic anastomosis,                            characterized by healthy appearing mucosa.                           - Non-bleeding internal hemorrhoids.                           - The examination was otherwise normal.                           - No specimens collected. Recommendation:            - Patient has a contact number available for                            emergencies. The signs and symptoms of potential                            delayed complications were discussed with the                            patient. Return to normal activities tomorrow.                            Written discharge instructions were provided to the                            patient.                           - Resume previous diet.                           -  Continue present medications.                           - Repeat colonoscopy in 5 years for surveillance. Mauri Pole, MD 02/19/2021 12:01:20 PM This report has been signed electronically.

## 2021-02-19 NOTE — Patient Instructions (Signed)
YOU HAD AN ENDOSCOPIC PROCEDURE TODAY AT THE [facility]:   Refer to the procedure report that was given to you for any specific questions about what was found during the examination.  If the procedure report does not answer your questions, please call your gastroenterologist to clarify.  If you requested that your care partner not be given the details of your procedure findings, then the procedure report has been included in a sealed envelope for you to review at your convenience later.  YOU SHOULD EXPECT: Some feelings of bloating in the abdomen. Passage of more gas than usual.  Walking can help get rid of the air that was put into your GI tract during the procedure and reduce the bloating. If you had a lower endoscopy (such as a colonoscopy or flexible sigmoidoscopy) you may notice spotting of blood in your stool or on the toilet paper. If you underwent a bowel prep for your procedure, you may not have a normal bowel movement for a few days.  Please Note:  You might notice some irritation and congestion in your nose or some drainage.  This is from the oxygen used during your procedure.  There is no need for concern and it should clear up in a day or so.  SYMPTOMS TO REPORT IMMEDIATELY:   Following lower endoscopy (colonoscopy or flexible sigmoidoscopy):  Excessive amounts of blood in the stool  Significant tenderness or worsening of abdominal pains  Swelling of the abdomen that is new, acute  Fever of 100F or higher   Following upper endoscopy (EGD)  Vomiting of blood or coffee ground material  New chest pain or pain under the shoulder blades  Painful or persistently difficult swallowing  New shortness of breath  Fever of 100F or higher  Black, tarry-looking stools  For urgent or emergent issues, a gastroenterologist can be reached at any hour by calling [phone]. Do not use MyChart messaging for urgent concerns.    DIET:  We do recommend a small meal at first, but  then you may proceed to your regular diet.  Drink plenty of fluids but you should avoid alcoholic beverages for 24 hours.  ACTIVITY:  You should plan to take it easy for the rest of today and you should NOT DRIVE or use heavy machinery until tomorrow (because of the sedation medicines used during the test).    FOLLOW UP: Our staff will call the number listed on your records 48-72 hours following your procedure to check on you and address any questions or concerns that you may have regarding the information given to you following your procedure. If we do not reach you, we will leave a message.  We will attempt to reach you two times.  During this call, we will ask if you have developed any symptoms of COVID 19. If you develop any symptoms (ie: fever, flu-like symptoms, shortness of breath, cough etc.) before then, please call [phone].  If you test positive for Covid 19 in the 2 weeks post procedure, please call and report this information to us.    If any biopsies were taken you will be contacted by phone or by letter within the next 1-3 weeks.  Please call us at [phone] if you have not heard about the biopsies in 3 weeks.    SIGNATURES/CONFIDENTIALITY: You and/or your care partner have signed paperwork which will be entered into your electronic medical record.  These signatures attest to the fact that that the information above on   your After Visit Summary has been reviewed and is understood.  Full responsibility of the confidentiality of this discharge information lies with you and/or your care-partner. 

## 2021-02-19 NOTE — Progress Notes (Signed)
VS taken by C.W. 

## 2021-02-21 ENCOUNTER — Other Ambulatory Visit: Payer: Self-pay | Admitting: Gastroenterology

## 2021-02-23 ENCOUNTER — Telehealth: Payer: Self-pay

## 2021-02-23 NOTE — Telephone Encounter (Signed)
  Follow up Call-  Call back number 02/19/2021  Post procedure Call Back phone  # [phone]  Permission to leave phone message Yes  Some recent data might be hidden     Patient questions:  Do you have a fever, pain , or abdominal swelling? No. Pain Score  0 *  Have you tolerated food without any problems? Yes.    Have you been able to return to your normal activities? Yes.    Do you have any questions about your discharge instructions: Diet   No. Medications  No. Follow up visit  No.  Do you have questions or concerns about your Care? No.  Actions: * If pain score is 4 or above: No action needed, pain <4.  1. Have you developed a fever since your procedure? no  2.   Have you had an respiratory symptoms (SOB or cough) since your procedure? no  3.   Have you tested positive for COVID 19 since your procedure no  4.   Have you had any family members/close contacts diagnosed with the COVID 19 since your procedure?  no   If yes to any of these questions please route to Joylene John, RN and Joella Prince, RN

## 2021-03-03 ENCOUNTER — Other Ambulatory Visit: Payer: Self-pay | Admitting: Adult Health

## 2021-03-06 ENCOUNTER — Other Ambulatory Visit: Payer: Self-pay | Admitting: Gastroenterology

## 2021-03-16 ENCOUNTER — Other Ambulatory Visit: Payer: Self-pay | Admitting: Adult Health

## 2021-06-12 ENCOUNTER — Other Ambulatory Visit: Payer: Self-pay | Admitting: Adult Health

## 2021-07-08 DIAGNOSIS — Z20822 Contact with and (suspected) exposure to covid-19: Secondary | ICD-10-CM | POA: Diagnosis not present

## 2021-09-11 ENCOUNTER — Other Ambulatory Visit: Payer: Self-pay | Admitting: Adult Health

## 2021-09-11 NOTE — Telephone Encounter (Signed)
Pt needs to schedule a CPE for further refills. Rx filled for 30days.

## 2021-10-08 ENCOUNTER — Other Ambulatory Visit: Payer: Self-pay | Admitting: Adult Health

## 2021-10-08 NOTE — Telephone Encounter (Signed)
Pt needs Cpe for further refills

## 2021-11-04 ENCOUNTER — Other Ambulatory Visit: Payer: Self-pay

## 2021-11-05 ENCOUNTER — Ambulatory Visit (INDEPENDENT_AMBULATORY_CARE_PROVIDER_SITE_OTHER): Payer: BC Managed Care – PPO | Admitting: Adult Health

## 2021-11-05 ENCOUNTER — Encounter: Payer: Self-pay | Admitting: Adult Health

## 2021-11-05 VITALS — BP 120/72 | HR 71 | Temp 98.1°F | Ht 72.0 in | Wt 193.0 lb

## 2021-11-05 DIAGNOSIS — N529 Male erectile dysfunction, unspecified: Secondary | ICD-10-CM | POA: Diagnosis not present

## 2021-11-05 DIAGNOSIS — Z0000 Encounter for general adult medical examination without abnormal findings: Secondary | ICD-10-CM

## 2021-11-05 DIAGNOSIS — E782 Mixed hyperlipidemia: Secondary | ICD-10-CM

## 2021-11-05 DIAGNOSIS — I10 Essential (primary) hypertension: Secondary | ICD-10-CM | POA: Diagnosis not present

## 2021-11-05 DIAGNOSIS — Z125 Encounter for screening for malignant neoplasm of prostate: Secondary | ICD-10-CM

## 2021-11-05 DIAGNOSIS — Z23 Encounter for immunization: Secondary | ICD-10-CM

## 2021-11-05 DIAGNOSIS — T148XXA Other injury of unspecified body region, initial encounter: Secondary | ICD-10-CM

## 2021-11-05 LAB — CBC WITH DIFFERENTIAL/PLATELET
Basophils Absolute: 0 10*3/uL (ref 0.0–0.1)
Basophils Relative: 0.5 % (ref 0.0–3.0)
Eosinophils Absolute: 0.1 10*3/uL (ref 0.0–0.7)
Eosinophils Relative: 1.6 % (ref 0.0–5.0)
HCT: 47.9 % (ref 39.0–52.0)
Hemoglobin: 15.8 g/dL (ref 13.0–17.0)
Lymphocytes Relative: 29.7 % (ref 12.0–46.0)
Lymphs Abs: 1.9 10*3/uL (ref 0.7–4.0)
MCHC: 33.1 g/dL (ref 30.0–36.0)
MCV: 91.2 fl (ref 78.0–100.0)
Monocytes Absolute: 0.6 10*3/uL (ref 0.1–1.0)
Monocytes Relative: 8.9 % (ref 3.0–12.0)
Neutro Abs: 3.8 10*3/uL (ref 1.4–7.7)
Neutrophils Relative %: 59.3 % (ref 43.0–77.0)
Platelets: 227 10*3/uL (ref 150.0–400.0)
RBC: 5.25 Mil/uL (ref 4.22–5.81)
RDW: 14.1 % (ref 11.5–15.5)
WBC: 6.4 10*3/uL (ref 4.0–10.5)

## 2021-11-05 LAB — COMPREHENSIVE METABOLIC PANEL
ALT: 32 U/L (ref 0–53)
AST: 22 U/L (ref 0–37)
Albumin: 4.4 g/dL (ref 3.5–5.2)
Alkaline Phosphatase: 40 U/L (ref 39–117)
BUN: 18 mg/dL (ref 6–23)
CO2: 29 mEq/L (ref 19–32)
Calcium: 9.5 mg/dL (ref 8.4–10.5)
Chloride: 103 mEq/L (ref 96–112)
Creatinine, Ser: 1.06 mg/dL (ref 0.40–1.50)
GFR: 75.21 mL/min (ref >60.00)
Glucose, Bld: 90 mg/dL (ref 70–99)
Potassium: 4.5 mEq/L (ref 3.5–5.1)
Sodium: 139 mEq/L (ref 135–145)
Total Bilirubin: 0.4 mg/dL (ref 0.2–1.2)
Total Protein: 6.8 g/dL (ref 6.0–8.3)

## 2021-11-05 LAB — LIPID PANEL
Cholesterol: 179 mg/dL (ref 0–200)
HDL: 47.8 mg/dL (ref >39.00)
LDL Cholesterol: 110 mg/dL — ABNORMAL HIGH (ref 0–99)
NonHDL: 131.33
Total CHOL/HDL Ratio: 4
Triglycerides: 107 mg/dL (ref 0.0–149.0)
VLDL: 21.4 mg/dL (ref 0.0–40.0)

## 2021-11-05 LAB — TSH: TSH: 1.27 u[IU]/mL (ref 0.35–5.50)

## 2021-11-05 LAB — PSA: PSA: 0.73 ng/mL (ref 0.10–4.00)

## 2021-11-05 MED ORDER — ROSUVASTATIN CALCIUM 5 MG PO TABS: 5.0000 mg | ORAL_TABLET | Freq: Every day | ORAL | 3 refills | Status: DC

## 2021-11-05 MED ORDER — METHYLPREDNISOLONE 4 MG PO TBPK: ORAL_TABLET | ORAL | 0 refills | Status: DC

## 2021-11-05 MED ORDER — CYCLOBENZAPRINE HCL 10 MG PO TABS: 10.0000 mg | ORAL_TABLET | Freq: Every evening | ORAL | 0 refills | Status: DC | PRN

## 2021-11-05 NOTE — Addendum Note (Signed)
Addended by: Gwenyth Ober R on: 11/05/2021 04:33 PM   Modules accepted: Orders

## 2021-11-05 NOTE — Patient Instructions (Signed)
It was great seeing you today   We will follow up with you regarding your labs   I have sent in a muscle relaxer called Flexeril ( this can make you feel sleepy) as well as a medrol dose pack to help with the muscle strain in you neck

## 2021-11-05 NOTE — Progress Notes (Signed)
Subjective:    Patient ID: Jesse Arroyo, male    DOB: 02/11/1961, 60 y.o.   MRN: [id number]  HPI Patient presents for yearly preventative medicine examination. He is a pleasant 60 year old male who  has a past medical history of Allergy, Colonic mass, Deviated nasal septum (01/14/14), Hyperlipidemia, Hypertension, and Skin abnormalities.  Essential hypertension-currently prescribed lisinopril/hydrochlorothiazide 20-12.5 mg.  He denies dizziness, lightheadedness, chest pain, shortness of breath, or headaches. BP Readings from Last 3 Encounters:  11/05/21 120/72  02/19/21 120/81  02/10/21 116/72   Hyperlipidemia-takes Crestor 5 mg daily.  He denies myalgia or fatigue Lab Results  Component Value Date   CHOL 203 (H) 09/24/2020   HDL 46 09/24/2020   LDLCALC 127 (H) 09/24/2020   LDLDIRECT 110.0 02/03/2016   TRIG 179 (H) 09/24/2020   CHOLHDL 4.4 09/24/2020   Erectile dysfunction-uses Viagra 50 to 100 mg as needed.  Neck Pain - has been present for about one month. Seems to be mostly on the right side. Pain is felt when looking towards the left shoulder. Pain is felt as an aching sensation. At home he has been using a sports cream with lidocaine which seems to help. He did buy a new mattress about 3-4 months ago.   All immunizations and health maintenance protocols were reviewed with the patient and needed orders were placed.  Appropriate screening laboratory values were ordered for the patient including screening of hyperlipidemia, renal function and hepatic function.  Medication reconciliation,  past medical history, social history, problem list and allergies were reviewed in detail with the patient  Goals were established with regard to weight loss, exercise, and  diet in compliance with medications. He tries to get some exercise and eats healthy   Wt Readings from Last 3 Encounters:  11/05/21 193 lb (87.5 kg)  02/19/21 194 lb (88 kg)  01/27/21 194 lb 3.2 oz (88.1 kg)    Review  of Systems  Constitutional: Negative.   HENT: Negative.    Eyes: Negative.   Respiratory: Negative.    Cardiovascular: Negative.   Gastrointestinal: Negative.   Endocrine: Negative.   Genitourinary: Negative.   Musculoskeletal:  Positive for neck pain.  Skin: Negative.   Allergic/Immunologic: Negative.   Neurological: Negative.   Hematological: Negative.   Psychiatric/Behavioral: Negative.    All other systems reviewed and are negative.  Past Medical History:  Diagnosis Date   Allergy    Colonic mass    Deviated nasal septum 01/14/14   per pt   Hyperlipidemia    Hypertension    Skin abnormalities    feet    Social History   Socioeconomic History   Marital status: Married    Spouse name: Not on file   Number of children: 2   Years of education: Not on file   Highest education level: Not on file  Occupational History   Occupation: Transport planner    Employer: [employer]  Tobacco Use   Smoking status: Former    Types: Cigarettes    Quit date: 12/27/1993    Years since quitting: 27.8   Smokeless tobacco: Former    Types: Snuff    Quit date: 12/28/2007  Vaping Use   Vaping Use: Never used  Substance and Sexual Activity   Alcohol use: Yes    Alcohol/week: 4.0 standard drinks    Types: 4 Glasses of wine per week   Drug use: No   Sexual activity: Not on file  Other Topics Concern  Not on file  Social History Narrative   Married   Two daughters - One lives at home.       Social Determinants of Health   Financial Resource Strain: Not on file  Food Insecurity: Not on file  Transportation Needs: Not on file  Physical Activity: Not on file  Stress: Not on file  Social Connections: Not on file  Intimate Partner Violence: Not on file    Past Surgical History:  Procedure Laterality Date   COLON RESECTION  2013   COLONOSCOPY     HEMICOLECTOMY  05/10/12   INSERTION OF MESH N/A 01/23/2014   Procedure: OPEN INSERTION OF MESH;  Surgeon: Stark Klein, MD;  Location: [facility];  Service: General;  Laterality: N/A;   VASECTOMY     VENTRAL HERNIA REPAIR N/A 01/23/2014   Procedure: [place];  Surgeon: Stark Klein, MD;  Location: [facility];  Service: General;  Laterality: N/A;   WISDOM TOOTH EXTRACTION      Family History  Problem Relation Age of Onset   Lung cancer Father 68   Hypertension Mother    Hypertension Other    Colon cancer Neg Hx    Rectal cancer Neg Hx    Stomach cancer Neg Hx    Pancreatic cancer Neg Hx    Colon polyps Neg Hx    Esophageal cancer Neg Hx     No Known Allergies  Current Outpatient Medications on File Prior to Visit  Medication Sig Dispense Refill   Ascorbic Acid (VITAMIN C PO) Take 1 tablet by mouth daily.     aspirin 81 MG tablet Take 81 mg by mouth daily with breakfast.      cholecalciferol (VITAMIN D3) 25 MCG (1000 UNIT) tablet Take 1,000 Units by mouth daily.     dicyclomine (BENTYL) 10 MG capsule TAKE 1 CAPSULE (10 MG TOTAL) BY MOUTH 4 (FOUR) TIMES DAILY - BEFORE MEALS AND AT BEDTIME. 360 capsule 1   lisinopril-hydrochlorothiazide (ZESTORETIC) 20-12.5 MG tablet TAKE 1 TABLET BY MOUTH EVERY DAY 90 tablet 1   Multiple Vitamin (MULTIVITAMIN) tablet Take 1 tablet by mouth daily with breakfast.      rosuvastatin (CRESTOR) 5 MG tablet TAKE 1 TABLET BY MOUTH EVERY DAY 30 tablet 0   sildenafil (VIAGRA) 100 MG tablet Take 0.5-1 tablets (50-100 mg total) by mouth daily as needed for erectile dysfunction. 10 tablet 3   dicyclomine (BENTYL) 20 MG tablet Take 0.5 tablets (10 mg total) by mouth 2 (two) times daily as needed for up to 10 days for spasms. 10 tablet 0   No current facility-administered medications on file prior to visit.    BP 120/72   Pulse 71   Temp 98.1 F (36.7 C) (Oral)   Ht 6' (1.829 m)   Wt 193 lb (87.5 kg)   SpO2 99%   BMI 26.18 kg/m        Objective:   Physical Exam Vitals and nursing note reviewed.  Constitutional:      General: He is not in  acute distress.    Appearance: Normal appearance. He is well-developed and normal weight.  HENT:     Head: Normocephalic and atraumatic.     Right Ear: Tympanic membrane, ear canal and external ear normal. There is no impacted cerumen.     Left Ear: Tympanic membrane, ear canal and external ear normal. There is no impacted cerumen.     Nose: Nose normal. No congestion or rhinorrhea.  Mouth/Throat:     Mouth: Mucous membranes are moist.     Pharynx: Oropharynx is clear. No oropharyngeal exudate or posterior oropharyngeal erythema.  Eyes:     General:        Right eye: No discharge.        Left eye: No discharge.     Extraocular Movements: Extraocular movements intact.     Conjunctiva/sclera: Conjunctivae normal.     Pupils: Pupils are equal, round, and reactive to light.  Neck:     Vascular: No carotid bruit.     Trachea: No tracheal deviation.  Cardiovascular:     Rate and Rhythm: Normal rate and regular rhythm.     Pulses: Normal pulses.     Heart sounds: Normal heart sounds. No murmur heard.   No friction rub. No gallop.  Pulmonary:     Effort: Pulmonary effort is normal. No respiratory distress.     Breath sounds: No stridor. No wheezing, rhonchi or rales.  Chest:     Chest wall: No tenderness.  Abdominal:     General: Bowel sounds are normal. There is no distension.     Palpations: Abdomen is soft. There is no mass.     Tenderness: There is no abdominal tenderness. There is no right CVA tenderness, left CVA tenderness, guarding or rebound.     Hernia: No hernia is present.  Musculoskeletal:        General: No swelling, tenderness, deformity or signs of injury. Normal range of motion.     Right lower leg: No edema.     Left lower leg: No edema.     Comments: Tenderness throughout right sided trapezius   Lymphadenopathy:     Cervical: No cervical adenopathy.  Skin:    General: Skin is warm and dry.     Capillary Refill: Capillary refill takes less than 2 seconds.      Coloration: Skin is not jaundiced or pale.     Findings: No bruising, erythema, lesion or rash.  Neurological:     General: No focal deficit present.     Mental Status: He is alert and oriented to person, place, and time.     Cranial Nerves: No cranial nerve deficit.     Sensory: No sensory deficit.     Motor: No weakness.     Coordination: Coordination normal.     Gait: Gait normal.     Deep Tendon Reflexes: Reflexes normal.  Psychiatric:        Mood and Affect: Mood normal.        Behavior: Behavior normal.        Thought Content: Thought content normal.        Judgment: Judgment normal.      Assessment & Plan:  1. Routine general medical examination at a health care facility - Follow up in one year  - Continue to stay active and exercise  - CBC with Differential/Platelet; Future - Comprehensive metabolic panel; Future - Lipid panel; Future - TSH; Future  2. Essential hypertension - BP well controlled. No change in medications  - CBC with Differential/Platelet; Future - Comprehensive metabolic panel; Future - Lipid panel; Future - TSH; Future  3. Prostate cancer screening  - PSA; Future  4. ERECTILE DYSFUNCTION, ORGANIC - Continue with viagra PRN  - CBC with Differential/Platelet; Future - Comprehensive metabolic panel; Future - Lipid panel; Future - TSH; Future  5. Mixed hyperlipidemia - Consider increase in statin  - CBC with Differential/Platelet; Future - Comprehensive  metabolic panel; Future - Lipid panel; Future - TSH; Future  6. Muscle strain  - cyclobenzaprine (FLEXERIL) 10 MG tablet; Take 1 tablet (10 mg total) by mouth at bedtime as needed for muscle spasms.  Dispense: 15 tablet; Refill: 0 - methylPREDNISolone (MEDROL DOSEPAK) 4 MG TBPK tablet; Take as directed  Dispense: 21 tablet; Refill: 0   7. Need for Tdap vaccination  - Tdap vaccine greater than or equal to 7yo IM  8. Need for immunization against influenza  - Flu Vaccine QUAD 42mo+IM  (Fluarix, Fluzone & Alfiuria Quad PF)   [name], NP

## 2022-01-04 ENCOUNTER — Telehealth: Payer: Self-pay | Admitting: Adult Health

## 2022-01-04 NOTE — Telephone Encounter (Signed)
Patient called to see if he could get a two referrals, the first one is to the podiatrist. Patient states that he needs his toenail removed as it is infected. He states that it is hard to bend down and clip the toenail to get it how it needs to be.  Second referral will be to have a cyst on left leg removed. He states that while it has been there for years, it has started to get bigger.     Good callback number is [phone]   Please advise

## 2022-01-05 NOTE — Telephone Encounter (Signed)
Spoke to pt and he stated that Jesse Arroyo knew about his concerns. I advised pt that I would sedn to [facility] for advise.

## 2022-01-05 NOTE — Telephone Encounter (Signed)
Please advise 

## 2022-01-06 NOTE — Telephone Encounter (Signed)
Patient notified of update  and verbalized understanding. Pt will call back to schedule appt.

## 2022-02-08 ENCOUNTER — Other Ambulatory Visit: Payer: Self-pay | Admitting: Adult Health

## 2022-05-26 DIAGNOSIS — H903 Sensorineural hearing loss, bilateral: Secondary | ICD-10-CM | POA: Diagnosis not present

## 2022-06-01 ENCOUNTER — Ambulatory Visit (INDEPENDENT_AMBULATORY_CARE_PROVIDER_SITE_OTHER): Payer: BC Managed Care – PPO

## 2022-06-01 ENCOUNTER — Ambulatory Visit: Payer: BC Managed Care – PPO | Admitting: Adult Health

## 2022-06-01 ENCOUNTER — Encounter: Payer: Self-pay | Admitting: Adult Health

## 2022-06-01 VITALS — BP 140/90 | HR 91 | Temp 98.4°F | Ht 72.0 in | Wt 191.0 lb

## 2022-06-01 DIAGNOSIS — D492 Neoplasm of unspecified behavior of bone, soft tissue, and skin: Secondary | ICD-10-CM | POA: Diagnosis not present

## 2022-06-01 DIAGNOSIS — M545 Low back pain, unspecified: Secondary | ICD-10-CM

## 2022-06-01 DIAGNOSIS — M25511 Pain in right shoulder: Secondary | ICD-10-CM

## 2022-06-01 DIAGNOSIS — L821 Other seborrheic keratosis: Secondary | ICD-10-CM

## 2022-06-01 DIAGNOSIS — G8929 Other chronic pain: Secondary | ICD-10-CM

## 2022-06-01 NOTE — Progress Notes (Signed)
Subjective:    Patient ID: Jesse Arroyo, male    DOB: [birth year], 61 y.o.   MRN: [id number]  HPI 61 year old male who  has a past medical history of Allergy, Colonic mass, Deviated nasal septum (01/14/14), Hyperlipidemia, Hypertension, and Skin abnormalities.  He presents to the clinic today for multiple issues.   His first issue is that of concern of skin cancer on his left upper arm. He reports having an area that will feel waxy and then fall off and start growing again.   His second issue is that of a cyst on his left leg that has been present for quite some time.  Feels as though it may be getting larger, would like a referral to dermatology for removal.  Additionally, he has had chronic low back pain for quite some time, last x-ray was in 2011.  He would like to get this taken look at see if there is anything that can be done to help with his back pain.  He has tried multiple conservative measures and they helped to some degree but do not fully resolve his pain   Finally, he has right shoulder pain that has been present for the last 6 to 8 months.  Pain is worse with some certain range of motion and can radiate up into his neck.  Again has tried conservative therapy without much improvement.  Denies loss of range of motion.  Review of Systems See HPI   Past Medical History:  Diagnosis Date   Allergy    Colonic mass    Deviated nasal septum 01/14/14   per pt   Hyperlipidemia    Hypertension    Skin abnormalities    feet    Social History   Socioeconomic History   Marital status: Married    Spouse name: Not on file   Number of children: 2   Years of education: Not on file   Highest education level: Not on file  Occupational History   Occupation: Transport planner    Employer: [employer]  Tobacco Use   Smoking status: Former    Types: Cigarettes    Quit date: 12/27/1993    Years since quitting: 28.4   Smokeless tobacco: Former    Types: Snuff    Quit  date: 12/28/2007  Vaping Use   Vaping Use: Never used  Substance and Sexual Activity   Alcohol use: Yes    Alcohol/week: 4.0 standard drinks    Types: 4 Glasses of wine per week   Drug use: No   Sexual activity: Not on file  Other Topics Concern   Not on file  Social History Narrative   Married   Two daughters - One lives at home.       Social Determinants of Health   Financial Resource Strain: Not on file  Food Insecurity: Not on file  Transportation Needs: Not on file  Physical Activity: Not on file  Stress: Not on file  Social Connections: Not on file  Intimate Partner Violence: Not on file    Past Surgical History:  Procedure Laterality Date   COLON RESECTION  2013   COLONOSCOPY     HEMICOLECTOMY  05/10/12   INSERTION OF MESH N/A 01/23/2014   Procedure: OPEN INSERTION OF MESH;  Surgeon: Stark Klein, MD;  Location: [facility];  Service: General;  Laterality: N/A;   VASECTOMY     VENTRAL HERNIA REPAIR N/A 01/23/2014   Procedure: LAPAROSCOPIC VENTRAL HERNIA REPAIR;  Surgeon: Stark Klein, MD;  Location: [facility];  Service: General;  Laterality: N/A;   WISDOM TOOTH EXTRACTION      Family History  Problem Relation Age of Onset   Lung cancer Father 84   Hypertension Mother    Hypertension Other    Colon cancer Neg Hx    Rectal cancer Neg Hx    Stomach cancer Neg Hx    Pancreatic cancer Neg Hx    Colon polyps Neg Hx    Esophageal cancer Neg Hx     No Known Allergies  Current Outpatient Medications on File Prior to Visit  Medication Sig Dispense Refill   Ascorbic Acid (VITAMIN C PO) Take 1 tablet by mouth daily.     aspirin 81 MG tablet Take 81 mg by mouth daily with breakfast.      cholecalciferol (VITAMIN D3) 25 MCG (1000 UNIT) tablet Take 1,000 Units by mouth daily.     cyclobenzaprine (FLEXERIL) 10 MG tablet Take 1 tablet (10 mg total) by mouth at bedtime as needed for muscle spasms. 15 tablet 0   dicyclomine (BENTYL) 10 MG capsule TAKE 1 CAPSULE (10 MG TOTAL) BY  MOUTH 4 (FOUR) TIMES DAILY - BEFORE MEALS AND AT BEDTIME. 360 capsule 1   lisinopril-hydrochlorothiazide (ZESTORETIC) 20-12.5 MG tablet TAKE 1 TABLET BY MOUTH EVERY DAY 90 tablet 1   methylPREDNISolone (MEDROL DOSEPAK) 4 MG TBPK tablet Take as directed 21 tablet 0   Multiple Vitamin (MULTIVITAMIN) tablet Take 1 tablet by mouth daily with breakfast.      rosuvastatin (CRESTOR) 5 MG tablet Take 1 tablet (5 mg total) by mouth daily. 90 tablet 3   sildenafil (VIAGRA) 100 MG tablet Take 0.5-1 tablets (50-100 mg total) by mouth daily as needed for erectile dysfunction. 10 tablet 3   dicyclomine (BENTYL) 20 MG tablet Take 0.5 tablets (10 mg total) by mouth 2 (two) times daily as needed for up to 10 days for spasms. 10 tablet 0   No current facility-administered medications on file prior to visit.    BP 140/90   Pulse 91   Temp 98.4 F (36.9 C) (Oral)   Ht 6' (1.829 m)   Wt 191 lb (86.6 kg)   SpO2 97%   BMI 25.90 kg/m        Objective:   Physical Exam Vitals and nursing note reviewed.  Constitutional:      Appearance: Normal appearance.  Cardiovascular:     Rate and Rhythm: Normal rate and regular rhythm.     Pulses: Normal pulses.     Heart sounds: Normal heart sounds.  Pulmonary:     Effort: Pulmonary effort is normal.     Breath sounds: Normal breath sounds.  Musculoskeletal:        General: No swelling or tenderness. Normal range of motion.  Skin:    General: Skin is warm and dry.     Capillary Refill: Capillary refill takes less than 2 seconds.     Comments: Small  cyst on lateral aspect of left leg  Dime sized seborrheic keratosis  noted on left upper arm   Neurological:     General: No focal deficit present.     Mental Status: He is alert and oriented to person, place, and time.  Psychiatric:        Mood and Affect: Mood normal.        Behavior: Behavior normal.        Thought Content: Thought content normal.  Assessment & Plan:  1. Chronic midline low back  pain without sciatica  - DG Lumbar Spine Complete; Future  2. Acute pain of right shoulder  - DG Shoulder Right; Future  3. Seborrheic keratosis - Informed patient that this skin condition was benign. We discussed treatment including cryotherapy vs doing nothing. He opted for cryotherapy. Using three freeze thaw cycles, seborrheic keratosis was frozen. He tolerated procedure well  - Can use motrin or tylenol as needed  4. Skin neoplasm  - Ambulatory referral to Dermatology  [name], NP   Time of total for care on the day of the encounter 35 minutes. This includes time spent in both face-to-face and non-face-to-face activities including preparing for the visit, reviewing the chart, time spent with patient, evaluation and counseling patient/family/caregiver, coordinating care, and time spent documenting in the chart which was performed on the date of service (06/01/2022). Note: this excludes any time spent performing billable procedures or separate charges (such as time spent counseling smoking cessation); these charges are billed separately.

## 2022-06-01 NOTE — Patient Instructions (Addendum)
Someone from dermatology will call you to schedule your appointment   We will follow up with you regarding your xrays   You can call [facility]) [address] [employer]  [phone]

## 2022-06-03 ENCOUNTER — Telehealth: Payer: Self-pay | Admitting: Adult Health

## 2022-06-03 NOTE — Telephone Encounter (Signed)
Pt is returning call to the office--received call about xray results.  Pt is requesting a call back.

## 2022-06-03 NOTE — Telephone Encounter (Signed)
Spoke with pt, went over xray results.

## 2022-06-10 ENCOUNTER — Telehealth: Payer: Self-pay | Admitting: Adult Health

## 2022-06-10 NOTE — Telephone Encounter (Signed)
Offered to schedule ov, declined

## 2022-06-11 ENCOUNTER — Ambulatory Visit: Payer: BC Managed Care – PPO | Admitting: Adult Health

## 2022-06-11 ENCOUNTER — Encounter: Payer: Self-pay | Admitting: Adult Health

## 2022-06-11 VITALS — BP 120/60 | HR 66 | Temp 98.5°F | Ht 72.0 in | Wt 189.0 lb

## 2022-06-11 DIAGNOSIS — M25511 Pain in right shoulder: Secondary | ICD-10-CM

## 2022-06-11 DIAGNOSIS — G8929 Other chronic pain: Secondary | ICD-10-CM

## 2022-06-11 DIAGNOSIS — M545 Low back pain, unspecified: Secondary | ICD-10-CM | POA: Diagnosis not present

## 2022-06-11 MED ORDER — METHYLPREDNISOLONE ACETATE 80 MG/ML IJ SUSP: 80.0000 mg | Freq: Once | INTRAMUSCULAR | Status: DC

## 2022-06-11 MED ORDER — CYCLOBENZAPRINE HCL 10 MG PO TABS: 10.0000 mg | ORAL_TABLET | Freq: Every evening | ORAL | 0 refills | Status: DC | PRN

## 2022-06-11 MED ORDER — METHYLPREDNISOLONE ACETATE 80 MG/ML IJ SUSP
80.0000 mg | Freq: Once | INTRAMUSCULAR | Status: AC
  Administered 2022-06-11: 80 mg via INTRA_ARTICULAR

## 2022-06-11 NOTE — Patient Instructions (Signed)
[facility] Address: [address] [place], [place] [address] Phone: [phone]

## 2022-06-11 NOTE — Progress Notes (Signed)
Subjective:    Patient ID: Jesse Arroyo, male    DOB: [birth year], 61 y.o.   MRN: [id number]  HPI 61 year old male who  has a past medical history of Allergy, Colonic mass, Deviated nasal septum (01/14/14), Hyperlipidemia, Hypertension, and Skin abnormalities.  He presents to the office today for follow up regarding chronic right shoulder pain that has been present for 6 to 8 months.  His pain is worse with some certain range of motion's and can radiate up into his neck.  He has tried conservative therapy without much improvement.  He did have an x-ray of the right shoulder done roughly 10 days ago which showed:  Moderate right acromioclavicular osteoarthritis  He is interested in trying a steroid injection to help alleviate the discomfort.  He is also waiting on PT to call to get him scheduled for his low back pain. He is wondering if there is anything that I can prescribe to help with the stiffness in his low back that feels like muscle tension. He has tried at home treatments such as stretching and heating pads without much improvement    Review of Systems See HPI   Past Medical History:  Diagnosis Date   Allergy    Colonic mass    Deviated nasal septum 01/14/14   per pt   Hyperlipidemia    Hypertension    Skin abnormalities    feet    Social History   Socioeconomic History   Marital status: Married    Spouse name: Not on file   Number of children: 2   Years of education: Not on file   Highest education level: Not on file  Occupational History   Occupation: Chartered loss adjuster: [employer]  Tobacco Use   Smoking status: Former    Types: Cigarettes    Quit date: 12/27/1993    Years since quitting: 28.4   Smokeless tobacco: Former    Types: Snuff    Quit date: 12/28/2007  Vaping Use   Vaping Use: Never used  Substance and Sexual Activity   Alcohol use: Yes    Alcohol/week: 4.0 standard drinks of alcohol    Types: 4 Glasses of wine per  week   Drug use: No   Sexual activity: Not on file  Other Topics Concern   Not on file  Social History Narrative   Married   Two daughters - One lives at home.       Social Determinants of Health   Financial Resource Strain: Not on file  Food Insecurity: Not on file  Transportation Needs: Not on file  Physical Activity: Not on file  Stress: Not on file  Social Connections: Not on file  Intimate Partner Violence: Not on file    Past Surgical History:  Procedure Laterality Date   COLON RESECTION  2013   COLONOSCOPY     HEMICOLECTOMY  05/10/12   INSERTION OF MESH N/A 01/23/2014   Procedure: OPEN INSERTION OF MESH;  Surgeon: Stark Klein, MD;  Location: [facility];  Service: General;  Laterality: N/A;   VASECTOMY     VENTRAL HERNIA REPAIR N/A 01/23/2014   Procedure: [place];  Surgeon: Stark Klein, MD;  Location: [facility];  Service: General;  Laterality: N/A;   WISDOM TOOTH EXTRACTION      Family History  Problem Relation Age of Onset   Lung cancer Father 11   Hypertension Mother    Hypertension Other  Colon cancer Neg Hx    Rectal cancer Neg Hx    Stomach cancer Neg Hx    Pancreatic cancer Neg Hx    Colon polyps Neg Hx    Esophageal cancer Neg Hx     No Known Allergies  Current Outpatient Medications on File Prior to Visit  Medication Sig Dispense Refill   Ascorbic Acid (VITAMIN C PO) Take 1 tablet by mouth daily.     aspirin 81 MG tablet Take 81 mg by mouth daily with breakfast.      cholecalciferol (VITAMIN D3) 25 MCG (1000 UNIT) tablet Take 1,000 Units by mouth daily.     cyclobenzaprine (FLEXERIL) 10 MG tablet Take 1 tablet (10 mg total) by mouth at bedtime as needed for muscle spasms. 15 tablet 0   dicyclomine (BENTYL) 10 MG capsule TAKE 1 CAPSULE (10 MG TOTAL) BY MOUTH 4 (FOUR) TIMES DAILY - BEFORE MEALS AND AT BEDTIME. 360 capsule 1   dicyclomine (BENTYL) 20 MG tablet Take 0.5 tablets (10 mg total) by mouth 2 (two) times daily as needed  for up to 10 days for spasms. 10 tablet 0   lisinopril-hydrochlorothiazide (ZESTORETIC) 20-12.5 MG tablet TAKE 1 TABLET BY MOUTH EVERY DAY 90 tablet 1   methylPREDNISolone (MEDROL DOSEPAK) 4 MG TBPK tablet Take as directed 21 tablet 0   Multiple Vitamin (MULTIVITAMIN) tablet Take 1 tablet by mouth daily with breakfast.      rosuvastatin (CRESTOR) 5 MG tablet Take 1 tablet (5 mg total) by mouth daily. 90 tablet 3   sildenafil (VIAGRA) 100 MG tablet Take 0.5-1 tablets (50-100 mg total) by mouth daily as needed for erectile dysfunction. 10 tablet 3   No current facility-administered medications on file prior to visit.    There were no vitals taken for this visit.      Objective:   Physical Exam Vitals and nursing note reviewed.  Constitutional:      Appearance: Normal appearance.  Pulmonary:     Breath sounds: Wheezing present.  Musculoskeletal:        General: Normal range of motion.  Skin:    General: Skin is warm and dry.     Capillary Refill: Capillary refill takes less than 2 seconds.  Neurological:     General: No focal deficit present.     Mental Status: He is alert and oriented to person, place, and time.  Psychiatric:        Mood and Affect: Mood normal.        Behavior: Behavior normal.        Thought Content: Thought content normal.       Assessment & Plan:  1. Chronic right shoulder pain Shoulder injection Verbal consent obtained and verified. Sterile betadine prep. Furthur cleansed with alcohol. Topical analgesic spray: Ethyl chloride. Joint: right  subacromial injection Approached in typical fashion with: posterior approach Completed without difficulty Meds: 3 cc lidocaine 2% no epi, 1 cc depomedrol '80mg'$ /cc Needle:1.5 inch 25 gauge Aftercare instructions and Red flags advised. Immediate improvement in pain noted  - methylPREDNISolone acetate (DEPO-MEDROL) injection 80 mg   2. Chronic midline low back pain without sciatica  - cyclobenzaprine (FLEXERIL)  10 MG tablet; Take 1 tablet (10 mg total) by mouth at bedtime as needed for muscle spasms.  Dispense: 15 tablet; Refill: 0  [name], NP

## 2022-06-16 ENCOUNTER — Ambulatory Visit: Payer: BC Managed Care – PPO | Admitting: Podiatry

## 2022-06-16 DIAGNOSIS — L603 Nail dystrophy: Secondary | ICD-10-CM | POA: Diagnosis not present

## 2022-06-22 ENCOUNTER — Other Ambulatory Visit: Payer: Self-pay | Admitting: Adult Health

## 2022-06-22 DIAGNOSIS — M545 Low back pain, unspecified: Secondary | ICD-10-CM

## 2022-08-04 ENCOUNTER — Other Ambulatory Visit: Payer: Self-pay | Admitting: Adult Health

## 2022-09-02 ENCOUNTER — Ambulatory Visit (INDEPENDENT_AMBULATORY_CARE_PROVIDER_SITE_OTHER): Payer: BC Managed Care – PPO

## 2022-09-02 ENCOUNTER — Ambulatory Visit: Payer: BC Managed Care – PPO | Admitting: Adult Health

## 2022-09-02 ENCOUNTER — Encounter: Payer: Self-pay | Admitting: Adult Health

## 2022-09-02 VITALS — BP 120/60 | HR 100 | Temp 98.3°F | Ht 72.0 in | Wt 195.0 lb

## 2022-09-02 DIAGNOSIS — M25551 Pain in right hip: Secondary | ICD-10-CM | POA: Diagnosis not present

## 2022-09-02 DIAGNOSIS — S39012A Strain of muscle, fascia and tendon of lower back, initial encounter: Secondary | ICD-10-CM

## 2022-09-02 MED ORDER — TIZANIDINE HCL 4 MG PO CAPS: 4.0000 mg | ORAL_CAPSULE | Freq: Three times a day (TID) | ORAL | 0 refills | Status: DC

## 2022-09-02 NOTE — Progress Notes (Signed)
Subjective:    Patient ID: Jesse Arroyo, male    DOB: 03-02-1961, 61 y.o.   MRN: [id number]  HPI 61 year old male who  has a past medical history of Allergy, Colonic mass, Deviated nasal septum (01/14/14), Hyperlipidemia, Hypertension, and Skin abnormalities.  He presents to the office today for an acute issue of right hip pain x2 weeks.  He reports that he was walking down a slope and lost his balance, when he was falling his right hip Twisting and he felt a popping crunching sensation.  He did feel pain immediately.  Few days later he noticed a bruise that went down the outside of his right leg.  Since that time he has been able to walk, hike, and bike without any problem but his right hip continues to have some pain although improving and he feels stiff in his right lower back.  He has not felt catching locking sensation in his hip while walking  Review of Systems See HPI   Past Medical History:  Diagnosis Date   Allergy    Colonic mass    Deviated nasal septum 01/14/14   per pt   Hyperlipidemia    Hypertension    Skin abnormalities    feet    Social History   Socioeconomic History   Marital status: Married    Spouse name: Not on file   Number of children: 2   Years of education: Not on file   Highest education level: Not on file  Occupational History   Occupation: Chartered loss adjuster: [employer]  Tobacco Use   Smoking status: Former    Types: Cigarettes    Quit date: 12/27/1993    Years since quitting: 28.7   Smokeless tobacco: Former    Types: Snuff    Quit date: 12/28/2007  Vaping Use   Vaping Use: Never used  Substance and Sexual Activity   Alcohol use: Yes    Alcohol/week: 4.0 standard drinks of alcohol    Types: 4 Glasses of wine per week   Drug use: No   Sexual activity: Not on file  Other Topics Concern   Not on file  Social History Narrative   Married   Two daughters - One lives at home.       Social Determinants of Health    Financial Resource Strain: Not on file  Food Insecurity: Not on file  Transportation Needs: Not on file  Physical Activity: Not on file  Stress: Not on file  Social Connections: Not on file  Intimate Partner Violence: Not on file    Past Surgical History:  Procedure Laterality Date   COLON RESECTION  2013   COLONOSCOPY     HEMICOLECTOMY  05/10/12   INSERTION OF MESH N/A 01/23/2014   Procedure: OPEN INSERTION OF MESH;  Surgeon: Stark Klein, MD;  Location: [facility];  Service: General;  Laterality: N/A;   VASECTOMY     VENTRAL HERNIA REPAIR N/A 01/23/2014   Procedure: Chambers;  Surgeon: Stark Klein, MD;  Location: [facility];  Service: General;  Laterality: N/A;   WISDOM TOOTH EXTRACTION      Family History  Problem Relation Age of Onset   Lung cancer Father 77   Hypertension Mother    Hypertension Other    Colon cancer Neg Hx    Rectal cancer Neg Hx    Stomach cancer Neg Hx    Pancreatic cancer Neg Hx  Colon polyps Neg Hx    Esophageal cancer Neg Hx     No Known Allergies  Current Outpatient Medications on File Prior to Visit  Medication Sig Dispense Refill   Ascorbic Acid (VITAMIN C PO) Take 1 tablet by mouth daily.     aspirin 81 MG tablet Take 81 mg by mouth daily with breakfast.      cholecalciferol (VITAMIN D3) 25 MCG (1000 UNIT) tablet Take 1,000 Units by mouth daily.     cyclobenzaprine (FLEXERIL) 10 MG tablet Take 1 tablet (10 mg total) by mouth at bedtime as needed for muscle spasms. 15 tablet 0   dicyclomine (BENTYL) 10 MG capsule TAKE 1 CAPSULE (10 MG TOTAL) BY MOUTH 4 (FOUR) TIMES DAILY - BEFORE MEALS AND AT BEDTIME. 360 capsule 1   lisinopril-hydrochlorothiazide (ZESTORETIC) 20-12.5 MG tablet TAKE 1 TABLET BY MOUTH EVERY DAY 90 tablet 1   Multiple Vitamin (MULTIVITAMIN) tablet Take 1 tablet by mouth daily with breakfast.      rosuvastatin (CRESTOR) 5 MG tablet Take 1 tablet (5 mg total) by mouth daily. 90 tablet 3   sildenafil  (VIAGRA) 100 MG tablet Take 0.5-1 tablets (50-100 mg total) by mouth daily as needed for erectile dysfunction. 10 tablet 3   dicyclomine (BENTYL) 20 MG tablet Take 0.5 tablets (10 mg total) by mouth 2 (two) times daily as needed for up to 10 days for spasms. 10 tablet 0   No current facility-administered medications on file prior to visit.    BP 120/60   Pulse 100   Temp 98.3 F (36.8 C) (Oral)   Ht 6' (1.829 m)   Wt 195 lb (88.5 kg)   SpO2 95%   BMI 26.45 kg/m       Objective:   Physical Exam Vitals and nursing note reviewed.  Constitutional:      Appearance: Normal appearance.  Musculoskeletal:        General: Tenderness present.       Back:     Right hip: Tenderness present. No crepitus. Normal range of motion. Normal strength.       Legs:     Comments: Have some tenderness with palpation along right sided IT band.  Had right hip pain with internal and external rotation of the right leg.  Skin:    General: Skin is warm and dry.  Neurological:     General: No focal deficit present.     Mental Status: He is alert and oriented to person, place, and time.       Assessment & Plan:  1. Right hip pain -Seems to be more soft tissue strain without tear.  Cannot completely rule out labral tear though.  Will check x-ray today and consider MRI in the future - tiZANidine (ZANAFLEX) 4 MG capsule; Take 1 capsule (4 mg total) by mouth 3 (three) times daily.  Dispense: 15 capsule; Refill: 0 - DG Hip Unilat W OR W/O Pelvis 2-3 Views Right; Future  2. Strain of lumbar region, initial encounter  - tiZANidine (ZANAFLEX) 4 MG capsule; Take 1 capsule (4 mg total) by mouth 3 (three) times daily.  Dispense: 15 capsule; Refill: 0 - DG Hip Unilat W OR W/O Pelvis 2-3 Views Right; Future  [name], NP  Time spent with patient today was 30 minutes which consisted of chart review, discussing IT band strain, labral tear and low back pain, work up, treatment answering questions and  documentation.

## 2022-09-02 NOTE — Patient Instructions (Signed)
Health Maintenance Due  Topic Date Due   Zoster Vaccines- Shingrix (1 of 2) Never done   COVID-19 Vaccine (3 - Pfizer risk series) 04/26/2020   INFLUENZA VACCINE  07/27/2022      Row Labels 11/05/2021    8:45 AM 08/24/2018   12:32 PM 08/24/2018   12:10 PM  Depression screen PHQ 2/9   Section Header. No data exists in this row.     Decreased Interest   0 0 2  Down, Depressed, Hopeless   0 0 0  PHQ - 2 Score   0 0 2  Altered sleeping     0  Tired, decreased energy     3  Change in appetite     3  Feeling bad or failure about yourself      0  Trouble concentrating     3  Moving slowly or fidgety/restless     0  Suicidal thoughts     0  PHQ-9 Score     11

## 2022-09-08 ENCOUNTER — Other Ambulatory Visit: Payer: Self-pay

## 2022-09-08 DIAGNOSIS — I10 Essential (primary) hypertension: Secondary | ICD-10-CM | POA: Insufficient documentation

## 2022-09-08 DIAGNOSIS — K56600 Partial intestinal obstruction, unspecified as to cause: Secondary | ICD-10-CM | POA: Diagnosis not present

## 2022-09-08 DIAGNOSIS — Z87891 Personal history of nicotine dependence: Secondary | ICD-10-CM | POA: Diagnosis not present

## 2022-09-08 DIAGNOSIS — D72829 Elevated white blood cell count, unspecified: Secondary | ICD-10-CM | POA: Diagnosis not present

## 2022-09-08 DIAGNOSIS — R112 Nausea with vomiting, unspecified: Secondary | ICD-10-CM | POA: Diagnosis not present

## 2022-09-08 DIAGNOSIS — R109 Unspecified abdominal pain: Secondary | ICD-10-CM | POA: Diagnosis not present

## 2022-09-09 ENCOUNTER — Encounter (HOSPITAL_BASED_OUTPATIENT_CLINIC_OR_DEPARTMENT_OTHER): Payer: Self-pay | Admitting: Emergency Medicine

## 2022-09-09 ENCOUNTER — Emergency Department (HOSPITAL_BASED_OUTPATIENT_CLINIC_OR_DEPARTMENT_OTHER): Payer: BC Managed Care – PPO

## 2022-09-09 ENCOUNTER — Emergency Department (HOSPITAL_BASED_OUTPATIENT_CLINIC_OR_DEPARTMENT_OTHER)
Admission: EM | Admit: 2022-09-09 | Discharge: 2022-09-09 | Disposition: A | Discharge status: Home / Self Care | Payer: BC Managed Care – PPO | Attending: Emergency Medicine | Admitting: Emergency Medicine

## 2022-09-09 DIAGNOSIS — K56600 Partial intestinal obstruction, unspecified as to cause: Secondary | ICD-10-CM

## 2022-09-09 DIAGNOSIS — I700 Atherosclerosis of aorta: Secondary | ICD-10-CM | POA: Diagnosis not present

## 2022-09-09 LAB — COMPREHENSIVE METABOLIC PANEL
ALT: 36 U/L (ref 0–44)
AST: 31 U/L (ref 15–41)
Albumin: 5.1 g/dL — ABNORMAL HIGH (ref 3.5–5.0)
Alkaline Phosphatase: 43 U/L (ref 38–126)
Anion gap: 16 — ABNORMAL HIGH (ref 5–15)
BUN: 16 mg/dL (ref 8–23)
CO2: 24 mmol/L (ref 22–32)
Calcium: 10.8 mg/dL — ABNORMAL HIGH (ref 8.9–10.3)
Chloride: 97 mmol/L — ABNORMAL LOW (ref 98–111)
Creatinine, Ser: 1.17 mg/dL (ref 0.61–1.24)
GFR, Estimated: >60 mL/min (ref >60)
Glucose, Bld: 139 mg/dL — ABNORMAL HIGH (ref 70–99)
Potassium: 3.7 mmol/L (ref 3.5–5.1)
Sodium: 137 mmol/L (ref 135–145)
Total Bilirubin: 1 mg/dL (ref 0.3–1.2)
Total Protein: 8 g/dL (ref 6.5–8.1)

## 2022-09-09 LAB — CBC
HCT: 48.7 % (ref 39.0–52.0)
Hemoglobin: 17.3 g/dL — ABNORMAL HIGH (ref 13.0–17.0)
MCH: 32.3 pg (ref 26.0–34.0)
MCHC: 35.5 g/dL (ref 30.0–36.0)
MCV: 91 fL (ref 80.0–100.0)
Platelets: 276 10*3/uL (ref 150–400)
RBC: 5.35 MIL/uL (ref 4.22–5.81)
RDW: 13.6 % (ref 11.5–15.5)
WBC: 13 10*3/uL — ABNORMAL HIGH (ref 4.0–10.5)
nRBC: 0 % (ref 0.0–0.2)

## 2022-09-09 LAB — LACTIC ACID, PLASMA: Lactic Acid, Venous: 1.1 mmol/L (ref 0.5–1.9)

## 2022-09-09 LAB — TROPONIN I (HIGH SENSITIVITY): Troponin I (High Sensitivity): 3 ng/L (ref <18)

## 2022-09-09 LAB — LIPASE, BLOOD: Lipase: 34 U/L (ref 11–51)

## 2022-09-09 MED ORDER — HYDROMORPHONE HCL 1 MG/ML IJ SOLN
1.0000 mg | Freq: Once | INTRAMUSCULAR | Status: AC
  Administered 2022-09-09: 1 mg via INTRAVENOUS
  Filled 2022-09-09: qty 1

## 2022-09-09 MED ORDER — ONDANSETRON HCL 4 MG/2ML IJ SOLN
4.0000 mg | Freq: Once | INTRAMUSCULAR | Status: AC
  Administered 2022-09-09: 4 mg via INTRAVENOUS
  Filled 2022-09-09: qty 2

## 2022-09-09 MED ORDER — SODIUM CHLORIDE 0.9 % IV BOLUS
1000.0000 mL | Freq: Once | INTRAVENOUS | Status: AC
  Administered 2022-09-09: 1000 mL via INTRAVENOUS

## 2022-09-09 MED ORDER — IOHEXOL 350 MG/ML SOLN
80.0000 mL | Freq: Once | INTRAVENOUS | Status: AC | PRN
  Administered 2022-09-09: 80 mL via INTRAVENOUS

## 2022-09-09 MED ORDER — ONDANSETRON 4 MG PO TBDP: 4.0000 mg | ORAL_TABLET | Freq: Three times a day (TID) | ORAL | 0 refills | Status: DC | PRN

## 2022-09-09 NOTE — Discharge Instructions (Signed)
You were evaluated in the Emergency Department and after careful evaluation, we did not find any emergent condition requiring admission or further testing in the hospital.  Your exam/testing today is overall reassuring.  Symptoms seem to be due to a partial small bowel obstruction.  Recommend limiting your diet to clear liquids over the next few days to rest your bowels to see if this partial obstruction will resolve.  Can use the Zofran as needed for nausea.  Please return to the Emergency Department if you experience any worsening of your condition.   Thank you for allowing [place] to be a part of your care.

## 2022-09-09 NOTE — ED Triage Notes (Signed)
Abdominal pain. Hx IBS,  Not relieved with home medications. Distress in triage

## 2022-09-09 NOTE — ED Notes (Signed)
Patient transported to CT 

## 2022-09-09 NOTE — ED Notes (Signed)
CT notified pt is ready for scan

## 2022-09-09 NOTE — ED Provider Notes (Signed)
[facility] Emergency Department Provider Note MRN:  [id number]  Arrival date & time: 09/09/22     Chief Complaint   Abdominal Pain   History of Present Illness   Jesse Arroyo is a 61 y.o. year-old male with a history of hypertension presenting to the ED with chief complaint of abdominal pain.  Abdominal pain starting at 6 PM, initially mild but has become severe.  Decreased bowel movements today.  Nausea vomiting.  No chest pain or shortness of breath.  Review of Systems  A thorough review of systems was obtained and all systems are negative except as noted in the HPI and PMH.   Patient's Health History    Past Medical History:  Diagnosis Date   Allergy    Colonic mass    Deviated nasal septum 01/14/14   per pt   Hyperlipidemia    Hypertension    Skin abnormalities    feet    Past Surgical History:  Procedure Laterality Date   COLON RESECTION  2013   COLONOSCOPY     HEMICOLECTOMY  05/10/12   INSERTION OF MESH N/A 01/23/2014   Procedure: OPEN INSERTION OF MESH;  Surgeon: Stark Klein, MD;  Location: [facility];  Service: General;  Laterality: N/A;   VASECTOMY     VENTRAL HERNIA REPAIR N/A 01/23/2014   Procedure: LAPAROSCOPIC VENTRAL HERNIA REPAIR;  Surgeon: Stark Klein, MD;  Location: [facility];  Service: General;  Laterality: N/A;   WISDOM TOOTH EXTRACTION      Family History  Problem Relation Age of Onset   Lung cancer Father 1   Hypertension Mother    Hypertension Other    Colon cancer Neg Hx    Rectal cancer Neg Hx    Stomach cancer Neg Hx    Pancreatic cancer Neg Hx    Colon polyps Neg Hx    Esophageal cancer Neg Hx     Social History   Socioeconomic History   Marital status: Married    Spouse name: Not on file   Number of children: 2   Years of education: Not on file   Highest education level: Not on file  Occupational History   Occupation: Chartered loss adjuster: [employer]  Tobacco Use   Smoking  status: Former    Types: Cigarettes    Quit date: 12/27/1993    Years since quitting: 28.7   Smokeless tobacco: Former    Types: Snuff    Quit date: 12/28/2007  Vaping Use   Vaping Use: Never used  Substance and Sexual Activity   Alcohol use: Yes    Alcohol/week: 4.0 standard drinks of alcohol    Types: 4 Glasses of wine per week   Drug use: No   Sexual activity: Not on file  Other Topics Concern   Not on file  Social History Narrative   Married   Two daughters - One lives at home.       Social Determinants of Health   Financial Resource Strain: Not on file  Food Insecurity: Not on file  Transportation Needs: Not on file  Physical Activity: Not on file  Stress: Not on file  Social Connections: Not on file  Intimate Partner Violence: Not on file     Physical Exam   Vitals:   09/09/22 0030 09/09/22 0100  BP: 128/60 118/71  Pulse: 60 68  Resp: 20 14  Temp:    SpO2: 99% 96%    CONSTITUTIONAL: Ill-appearing, in  severe pain NEURO/PSYCH:  Alert and oriented x 3, no focal deficits EYES:  eyes equal and reactive ENT/NECK:  no LAD, no JVD CARDIO: Regular rate, well-perfused, normal S1 and S2 PULM:  CTAB no wheezing or rhonchi GI/GU:  non-distended, distended and tender abdomen MSK/SPINE:  No gross deformities, no edema SKIN:  no rash, atraumatic   *Additional and/or pertinent findings included in MDM below  Diagnostic and Interventional Summary    EKG Interpretation  Date/Time:    Ventricular Rate:    PR Interval:    QRS Duration:   QT Interval:    QTC Calculation:   R Axis:     Text Interpretation:         Labs Reviewed  CBC - Abnormal; Notable for the following components:      Result Value   WBC 13.0 (*)    Hemoglobin 17.3 (*)    All other components within normal limits  COMPREHENSIVE METABOLIC PANEL - Abnormal; Notable for the following components:   Chloride 97 (*)    Glucose, Bld 139 (*)    Calcium 10.8 (*)    Albumin 5.1 (*)    Anion gap 16  (*)    All other components within normal limits  LIPASE, BLOOD  LACTIC ACID, PLASMA  TROPONIN I (HIGH SENSITIVITY)    CT Angio Chest/Abd/Pel for Dissection W and/or Wo Contrast  Final Result      Medications  sodium chloride 0.9 % bolus 1,000 mL (1,000 mLs Intravenous New Bag/Given 09/09/22 0105)  HYDROmorphone (DILAUDID) injection 1 mg (1 mg Intravenous Given 09/09/22 0024)  ondansetron (ZOFRAN) injection 4 mg (4 mg Intravenous Given 09/09/22 0022)  iohexol (OMNIPAQUE) 350 MG/ML injection 80 mL (80 mLs Intravenous Contrast Given 09/09/22 0043)     Procedures  /  Critical Care Ultrasound ED Abd  Date/Time: 09/09/2022 12:43 AM  Performed by: Maudie Flakes, MD Authorized by: Maudie Flakes, MD   Procedure details:    Indications: abdominal pain     Assessment for:  AAA   Aorta:  Not visualized    Images: not archived   Study Limitations: bowel gas Comments:     Prominent dilated loops of bowel seen, impairing any view of the aorta   ED Course and Medical Decision Making  Initial Impression and Ddx Differential diagnosis includes AAA, small bowel obstruction, pancreatitis, awaiting CT imaging.  Bedside ultrasound unable to visualize the aorta as described above.  Past medical/surgical history that increases complexity of ED encounter: History of partial colectomy  Interpretation of Diagnostics I personally reviewed the laboratory assessment and my interpretation is as follows: No significant blood count or electrolyte disturbance, minimal leukocytosis  CT imaging is without aortic pathology.  There is a partial small bowel obstruction noted, possibly due to an enteritis.  Patient Reassessment and Ultimate Disposition/Management     Patient observed for 2 to 3 hours in the emergency department.  After the first dose of Zofran, Dilaudid, fluids patient felt much better.  Was able to relax.  Pain has not returned, he feels much less distended, he wants to go home.  We  discussed at length the CT imaging results, we discussed the possibility of a partial small bowel obstruction becoming a full obstruction and the possibility that he may need to return to the hospital if symptoms worsen.  Offered to keep him here in the emergency department for a few more hours to ensure continued improvement.  Patient seems reliable, he wants to go home, he  will return if symptoms worsen.  Appropriate for discharge  Patient management required discussion with the following services or consulting groups:  None  Complexity of Problems Addressed Acute illness or injury that poses threat of life of bodily function  Additional Data Reviewed and Analyzed Further history obtained from: Further history from spouse/family member  Additional Factors Impacting ED Encounter Risk Prescriptions and Consideration of hospitalization  Barth Kirks. [place] Small, [place] [email or web address]  Final Clinical Impressions(s) / ED Diagnoses     ICD-10-CM   1. Partial small bowel obstruction ([place])  K56.600       ED Discharge Orders          Ordered    ondansetron (ZOFRAN-ODT) 4 MG disintegrating tablet  Every 8 hours PRN        09/09/22 0226             Discharge Instructions Discussed with and Provided to Patient:     Discharge Instructions      You were evaluated in the Emergency Department and after careful evaluation, we did not find any emergent condition requiring admission or further testing in the hospital.  Your exam/testing today is overall reassuring.  Symptoms seem to be due to a partial small bowel obstruction.  Recommend limiting your diet to clear liquids over the next few days to rest your bowels to see if this partial obstruction will resolve.  Can use the Zofran as needed for nausea.  Please return to the Emergency Department if you experience any worsening of your condition.   Thank you for allowing [place] to be a  part of your care.       Maudie Flakes, MD 09/09/22 0230

## 2022-09-30 DIAGNOSIS — H903 Sensorineural hearing loss, bilateral: Secondary | ICD-10-CM | POA: Diagnosis not present

## 2022-09-30 DIAGNOSIS — J343 Hypertrophy of nasal turbinates: Secondary | ICD-10-CM | POA: Diagnosis not present

## 2022-09-30 DIAGNOSIS — H9313 Tinnitus, bilateral: Secondary | ICD-10-CM | POA: Diagnosis not present

## 2022-09-30 DIAGNOSIS — J310 Chronic rhinitis: Secondary | ICD-10-CM | POA: Diagnosis not present

## 2022-09-30 DIAGNOSIS — H6983 Other specified disorders of Eustachian tube, bilateral: Secondary | ICD-10-CM | POA: Diagnosis not present

## 2022-09-30 DIAGNOSIS — J342 Deviated nasal septum: Secondary | ICD-10-CM | POA: Diagnosis not present

## 2022-11-01 ENCOUNTER — Other Ambulatory Visit: Payer: Self-pay | Admitting: Adult Health

## 2022-12-29 ENCOUNTER — Other Ambulatory Visit: Payer: Self-pay

## 2022-12-29 ENCOUNTER — Emergency Department (HOSPITAL_BASED_OUTPATIENT_CLINIC_OR_DEPARTMENT_OTHER): Payer: BC Managed Care – PPO

## 2022-12-29 ENCOUNTER — Encounter (HOSPITAL_BASED_OUTPATIENT_CLINIC_OR_DEPARTMENT_OTHER): Payer: Self-pay

## 2022-12-29 ENCOUNTER — Emergency Department (HOSPITAL_BASED_OUTPATIENT_CLINIC_OR_DEPARTMENT_OTHER)
Admission: EM | Admit: 2022-12-29 | Discharge: 2022-12-29 | Disposition: A | Discharge status: Home / Self Care | Payer: BC Managed Care – PPO | Attending: Emergency Medicine | Admitting: Emergency Medicine

## 2022-12-29 DIAGNOSIS — I10 Essential (primary) hypertension: Secondary | ICD-10-CM | POA: Diagnosis not present

## 2022-12-29 DIAGNOSIS — Z7982 Long term (current) use of aspirin: Secondary | ICD-10-CM | POA: Insufficient documentation

## 2022-12-29 DIAGNOSIS — K56600 Partial intestinal obstruction, unspecified as to cause: Secondary | ICD-10-CM

## 2022-12-29 DIAGNOSIS — R1030 Lower abdominal pain, unspecified: Secondary | ICD-10-CM | POA: Diagnosis not present

## 2022-12-29 DIAGNOSIS — K6389 Other specified diseases of intestine: Secondary | ICD-10-CM | POA: Diagnosis not present

## 2022-12-29 DIAGNOSIS — Z79899 Other long term (current) drug therapy: Secondary | ICD-10-CM | POA: Diagnosis not present

## 2022-12-29 DIAGNOSIS — D72829 Elevated white blood cell count, unspecified: Secondary | ICD-10-CM | POA: Insufficient documentation

## 2022-12-29 DIAGNOSIS — R109 Unspecified abdominal pain: Secondary | ICD-10-CM

## 2022-12-29 DIAGNOSIS — R1033 Periumbilical pain: Secondary | ICD-10-CM | POA: Diagnosis not present

## 2022-12-29 LAB — CBC
HCT: 47.9 % (ref 39.0–52.0)
Hemoglobin: 16.4 g/dL (ref 13.0–17.0)
MCH: 31.1 pg (ref 26.0–34.0)
MCHC: 34.2 g/dL (ref 30.0–36.0)
MCV: 90.7 fL (ref 80.0–100.0)
Platelets: 236 10*3/uL (ref 150–400)
RBC: 5.28 MIL/uL (ref 4.22–5.81)
RDW: 13.5 % (ref 11.5–15.5)
WBC: 12 10*3/uL — ABNORMAL HIGH (ref 4.0–10.5)
nRBC: 0 % (ref 0.0–0.2)

## 2022-12-29 LAB — COMPREHENSIVE METABOLIC PANEL
ALT: 31 U/L (ref 0–44)
AST: 23 U/L (ref 15–41)
Albumin: 4.7 g/dL (ref 3.5–5.0)
Alkaline Phosphatase: 36 U/L — ABNORMAL LOW (ref 38–126)
Anion gap: 10 (ref 5–15)
BUN: 18 mg/dL (ref 8–23)
CO2: 26 mmol/L (ref 22–32)
Calcium: 9.5 mg/dL (ref 8.9–10.3)
Chloride: 103 mmol/L (ref 98–111)
Creatinine, Ser: 1.09 mg/dL (ref 0.61–1.24)
GFR, Estimated: >60 mL/min (ref >60)
Glucose, Bld: 118 mg/dL — ABNORMAL HIGH (ref 70–99)
Potassium: 4.2 mmol/L (ref 3.5–5.1)
Sodium: 139 mmol/L (ref 135–145)
Total Bilirubin: 0.5 mg/dL (ref 0.3–1.2)
Total Protein: 7.4 g/dL (ref 6.5–8.1)

## 2022-12-29 LAB — LIPASE, BLOOD: Lipase: 58 U/L — ABNORMAL HIGH (ref 11–51)

## 2022-12-29 MED ORDER — ONDANSETRON HCL 4 MG/2ML IJ SOLN
4.0000 mg | Freq: Once | INTRAMUSCULAR | Status: AC
  Administered 2022-12-29: 4 mg via INTRAVENOUS
  Filled 2022-12-29: qty 2

## 2022-12-29 MED ORDER — SODIUM CHLORIDE 0.9 % IV BOLUS
1000.0000 mL | Freq: Once | INTRAVENOUS | Status: AC
  Administered 2022-12-29: 1000 mL via INTRAVENOUS

## 2022-12-29 MED ORDER — MORPHINE SULFATE (PF) 4 MG/ML IV SOLN
4.0000 mg | Freq: Once | INTRAVENOUS | Status: AC
  Administered 2022-12-29: 4 mg via INTRAVENOUS
  Filled 2022-12-29: qty 1

## 2022-12-29 MED ORDER — HYDROMORPHONE HCL 1 MG/ML IJ SOLN
1.0000 mg | Freq: Once | INTRAMUSCULAR | Status: AC
  Administered 2022-12-29: 1 mg via INTRAVENOUS
  Filled 2022-12-29: qty 1

## 2022-12-29 MED ORDER — IOHEXOL 300 MG/ML  SOLN
80.0000 mL | Freq: Once | INTRAMUSCULAR | Status: AC | PRN
  Administered 2022-12-29: 80 mL via INTRAVENOUS

## 2022-12-29 NOTE — Discharge Instructions (Signed)
Drink plenty of fluids and get plenty of rest.  Return to the emergency department if you develop worsening pain, high fever, bloody stools, or for other new and concerning symptoms.

## 2022-12-29 NOTE — ED Triage Notes (Signed)
Pt c/o "severe intestinal cramping," onset 10pm. States he took 2 bentyl, no relief. Reports "a little" vomiting, 2x since 2a. Hx of same, never been hospitalized for it.  In obvious discomfort at time of triage

## 2022-12-29 NOTE — ED Provider Notes (Signed)
[facility] EMERGENCY DEPT Provider Note   CSN: [id number] Arrival date & time: 12/29/22  0300     History  Chief Complaint  Patient presents with   Abdominal Pain    Jesse Arroyo is a 62 y.o. male.  Patient is a 62 year old male with past medical history of hypertension, hernia repair, and partial colectomy due to benign cancerous adenoma in 2013.  Patient presenting with complaints of crampy abdominal pain that started earlier this evening.  He describes severe pain to the periumbilical region that he describes as a "spasm".  He had a similar episode in June of this year and was diagnosed with a partial small bowel obstruction.  He was given IV fluids and medications and symptoms resolved and he went home.  He has been doing well since.  He denies any diarrhea or constipation.  He does report an episode of vomiting this evening.  The history is provided by the patient.       Home Medications Prior to Admission medications   Medication Sig Start Date End Date Taking? Authorizing Provider  Ascorbic Acid (VITAMIN C PO) Take 1 tablet by mouth daily.    [provider]  aspirin 81 MG tablet Take 81 mg by mouth daily with breakfast.     [provider]  cholecalciferol (VITAMIN D3) 25 MCG (1000 UNIT) tablet Take 1,000 Units by mouth daily.    [provider]  cyclobenzaprine (FLEXERIL) 10 MG tablet Take 1 tablet (10 mg total) by mouth at bedtime as needed for muscle spasms. 06/11/22   Nafziger, Tommi Rumps, NP  dicyclomine (BENTYL) 10 MG capsule TAKE 1 CAPSULE (10 MG TOTAL) BY MOUTH 4 (FOUR) TIMES DAILY - BEFORE MEALS AND AT BEDTIME. 03/06/21   Nandigam, Venia Minks, MD  dicyclomine (BENTYL) 20 MG tablet Take 0.5 tablets (10 mg total) by mouth 2 (two) times daily as needed for up to 10 days for spasms. 06/22/20 06/11/22  Marcello Fennel, PA-C  lisinopril-hydrochlorothiazide (ZESTORETIC) 20-12.5 MG tablet TAKE 1 TABLET BY MOUTH EVERY DAY 08/04/22   Nafziger,  Tommi Rumps, NP  Multiple Vitamin (MULTIVITAMIN) tablet Take 1 tablet by mouth daily with breakfast.     [provider]  ondansetron (ZOFRAN-ODT) 4 MG disintegrating tablet Take 1 tablet (4 mg total) by mouth every 8 (eight) hours as needed for nausea or vomiting. 09/09/22   Maudie Flakes, MD  rosuvastatin (CRESTOR) 5 MG tablet TAKE 1 TABLET (5 MG TOTAL) BY MOUTH DAILY. 11/02/22   Nafziger, Tommi Rumps, NP  sildenafil (VIAGRA) 100 MG tablet Take 0.5-1 tablets (50-100 mg total) by mouth daily as needed for erectile dysfunction. 01/07/21   Nafziger, Tommi Rumps, NP  tiZANidine (ZANAFLEX) 4 MG capsule Take 1 capsule (4 mg total) by mouth 3 (three) times daily. 09/02/22   [name], NP      Allergies    Patient has no known allergies.    Review of Systems   Review of Systems  All other systems reviewed and are negative.   Physical Exam Updated Vital Signs BP (!) 115/101   Pulse 93   Temp 98.6 F (37 C) (Oral)   SpO2 99%  Physical Exam Vitals and nursing note reviewed.  Constitutional:      General: He is not in acute distress.    Appearance: He is well-developed. He is not diaphoretic.  HENT:     Head: Normocephalic and atraumatic.  Cardiovascular:     Rate and Rhythm: Normal rate and regular rhythm.  Heart sounds: No murmur heard.    No friction rub.  Pulmonary:     Effort: Pulmonary effort is normal. No respiratory distress.     Breath sounds: Normal breath sounds. No wheezing or rales.  Abdominal:     General: Bowel sounds are normal. There is no distension.     Palpations: Abdomen is soft.     Tenderness: There is abdominal tenderness in the periumbilical area. There is no right CVA tenderness, left CVA tenderness, guarding or rebound.  Musculoskeletal:        General: Normal range of motion.     Cervical back: Normal range of motion and neck supple.  Skin:    General: Skin is warm and dry.  Neurological:     Mental Status: He is alert and oriented to person, place, and time.      Coordination: Coordination normal.     ED Results / Procedures / Treatments   Labs (all labs ordered are listed, but only abnormal results are displayed) Labs Reviewed  CBC - Abnormal; Notable for the following components:      Result Value   WBC 12.0 (*)    All other components within normal limits  LIPASE, BLOOD  COMPREHENSIVE METABOLIC PANEL  URINALYSIS, ROUTINE W REFLEX MICROSCOPIC    EKG None  Radiology No results found.  Procedures Procedures    Medications Ordered in ED Medications  sodium chloride 0.9 % bolus 1,000 mL (has no administration in time range)  ondansetron (ZOFRAN) injection 4 mg (has no administration in time range)  morphine (PF) 4 MG/ML injection 4 mg (has no administration in time range)    ED Course/ Medical Decision Making/ A&P  Patient presenting here with complaints of abdominal pain as described in the HPI.  He has history of prior partial colectomy secondary to villous adenoma in the past.  He arrives here with stable vital signs and is afebrile.  There is tenderness to palpation in the periumbilical region, but no peritoneal signs.  He does appear uncomfortable.  Workup initiated including CBC, metabolic panel, and lipase.  He has a slight leukocytosis with white count of 12,000, but electrolytes are otherwise unremarkable.  CT scan was obtained showing evidence for a possible early partial small bowel obstruction.  Patient given IV fluids and morphine followed by Dilaudid for pain.  He also received Zofran for nausea and is now feeling considerably improved.  Patient is actually requesting to go home stating that his pain has resolved.  At this point, I feel as though this is reasonable.  He had a similar presentation several months ago with nearly identical workup and response to medication.  He will be discharged with as needed return.  Final Clinical Impression(s) / ED Diagnoses Final diagnoses:  None    Rx / DC Orders ED  Discharge Orders     None         Veryl Speak, MD 12/29/22 [phone]

## 2023-01-25 ENCOUNTER — Ambulatory Visit: Payer: BC Managed Care – PPO | Admitting: Family Medicine

## 2023-01-25 ENCOUNTER — Encounter: Payer: Self-pay | Admitting: Family Medicine

## 2023-01-25 VITALS — BP 128/82 | HR 63 | Temp 98.0°F | Wt 193.2 lb

## 2023-01-25 DIAGNOSIS — B029 Zoster without complications: Secondary | ICD-10-CM

## 2023-01-25 MED ORDER — VALACYCLOVIR HCL 1 G PO TABS: 1000.0000 mg | ORAL_TABLET | Freq: Three times a day (TID) | ORAL | 0 refills | Status: DC

## 2023-01-25 NOTE — Progress Notes (Signed)
   Subjective:    Patient ID: Jesse Arroyo, male    DOB: November 28, 1961, 62 y.o.   MRN: [id number]  HPI Here for 4 days of itching and burning in the skin of his inner left thigh, left scrotum, and left buttock. There has been a rash in these areas for the past 3 days. He has never had this before.    Review of Systems  Constitutional: Negative.   Respiratory: Negative.    Cardiovascular: Negative.   Skin:  Positive for rash.       Objective:   Physical Exam Constitutional:      Appearance: Normal appearance.  Cardiovascular:     Rate and Rhythm: Normal rate and regular rhythm.     Pulses: Normal pulses.     Heart sounds: Normal heart sounds.  Pulmonary:     Effort: Pulmonary effort is normal.     Breath sounds: Normal breath sounds.  Skin:    Comments: There bands of red vesicles and papules on the proximal inner left thigh, left scrotum, the left buttock, and the left perineum   Neurological:     Mental Status: He is alert.           Assessment & Plan:  Shingles, treat with 10 days of Valtrex. I advised him to get the shingles vaccine after this resolves.  Alysia Penna, MD

## 2023-01-27 ENCOUNTER — Other Ambulatory Visit: Payer: Self-pay | Admitting: Adult Health

## 2023-05-30 ENCOUNTER — Encounter: Payer: Self-pay | Admitting: Family Medicine

## 2023-05-30 ENCOUNTER — Ambulatory Visit (INDEPENDENT_AMBULATORY_CARE_PROVIDER_SITE_OTHER): Payer: BC Managed Care – PPO | Admitting: Family Medicine

## 2023-05-30 VITALS — BP 132/88 | HR 76 | Temp 98.4°F | Resp 17 | Ht 72.0 in | Wt 184.1 lb

## 2023-05-30 DIAGNOSIS — L089 Local infection of the skin and subcutaneous tissue, unspecified: Secondary | ICD-10-CM | POA: Diagnosis not present

## 2023-05-30 DIAGNOSIS — W57XXXA Bitten or stung by nonvenomous insect and other nonvenomous arthropods, initial encounter: Secondary | ICD-10-CM

## 2023-05-30 DIAGNOSIS — S80862A Insect bite (nonvenomous), left lower leg, initial encounter: Secondary | ICD-10-CM | POA: Diagnosis not present

## 2023-05-30 DIAGNOSIS — S30861A Insect bite (nonvenomous) of abdominal wall, initial encounter: Secondary | ICD-10-CM | POA: Diagnosis not present

## 2023-05-30 MED ORDER — DOXYCYCLINE HYCLATE 100 MG PO TABS: 100.0000 mg | ORAL_TABLET | Freq: Two times a day (BID) | ORAL | 0 refills | Status: DC

## 2023-05-30 NOTE — Patient Instructions (Signed)
Follow up as needed or as scheduled START the Doxycycline twice daily- take w/ food Try and limit sun exposure as this medication can make you sun sensitive Call with any questions or concerns Have a great summer!!!

## 2023-05-30 NOTE — Progress Notes (Signed)
   Subjective:    Patient ID: Jesse Arroyo, male    DOB: 07-09-1961, 62 y.o.   MRN: [id number]  HPI Tick bites- 1 bite occurred 2 weeks ago and 1 this past Thursday.  First bite was on L lower leg, 2nd bite on abdomen.  Was able to remove both ticks.  No fever, rashes, body aches.  Had some itching of leg bite- 'it's about cleared up'.  1 tick was 'so small I could barely see it'.     Review of Systems For ROS see HPI     Objective:   Physical Exam Vitals reviewed.  Constitutional:      General: He is not in acute distress.    Appearance: Normal appearance. He is not ill-appearing.  Musculoskeletal:        General: No swelling or tenderness.  Skin:    General: Skin is warm and dry.     Findings: Erythema (redness and mild induration around central abdominal bite site, no TTP) and lesion (tick bite of L lower leg almost completely healed- no redness, induration, TTP, fluctuance) present.  Neurological:     General: No focal deficit present.     Mental Status: He is alert and oriented to person, place, and time.  Psychiatric:        Mood and Affect: Mood normal.        Behavior: Behavior normal.        Thought Content: Thought content normal.           Assessment & Plan:  Infected Tick Bite- new.  Abdominal wall.  Since abx are needed, it makes sense to use Doxycycline to cover any potential tick borne illnesses (even though he is asymptomatic at this time).  He is UTD on Tdap.  Reviewed supportive care and red flags that should prompt return.  Pt expressed understanding and is in agreement w/ plan.   Tick bite L lower leg- new.  Area has nearly completely healed.  Only small amount of redness remains.  Reassurance provided.

## 2023-06-22 NOTE — Progress Notes (Unsigned)
Subjective:    Patient ID: Jesse Arroyo, male    DOB: Oct 08, 1961, 62 y.o.   MRN: [id number]  HPI  Patient presents for yearly preventative medicine examination. He is a pleasant 62 year old male who  has a past medical history of Allergy, Colonic mass, Deviated nasal septum (01/14/14), Hyperlipidemia, Hypertension, and Skin abnormalities.  Essential hypertension-currently prescribed lisinopril/hydrochlorothiazide 20-12.5 mg.  He denies dizziness, lightheadedness, chest pain, shortness of breath, or headaches. BP Readings from Last 3 Encounters:  05/30/23 132/88  01/25/23 128/82  12/29/22 114/72    Hyperlipidemia-takes Crestor 5 mg daily.  He denies myalgia or fatigue Lab Results  Component Value Date   CHOL 179 11/05/2021   HDL 47.80 11/05/2021   LDLCALC 110 (H) 11/05/2021   LDLDIRECT 110.0 02/03/2016   TRIG 107.0 11/05/2021   CHOLHDL 4 11/05/2021    Erectile dysfunction-uses Viagra 50 to 100 mg as needed.    All immunizations and health maintenance protocols were reviewed with the patient and needed orders were placed.  Appropriate screening laboratory values were ordered for the patient including screening of hyperlipidemia, renal function and hepatic function. If indicated by BPH, a PSA was ordered.  Medication reconciliation,  past medical history, social history, problem list and allergies were reviewed in detail with the patient  Goals were established with regard to weight loss, exercise, and  diet in compliance with medications Wt Readings from Last 3 Encounters:  05/30/23 184 lb 2 oz (83.5 kg)  01/25/23 193 lb 3.2 oz (87.6 kg)  09/02/22 195 lb (88.5 kg)    He is up to date on routine colon cancer screening    Review of Systems  Constitutional: Negative.   HENT: Negative.    Eyes: Negative.   Respiratory: Negative.    Cardiovascular: Negative.   Gastrointestinal: Negative.   Endocrine: Negative.   Genitourinary: Negative.   Musculoskeletal: Negative.    Skin: Negative.   Allergic/Immunologic: Negative.   Neurological: Negative.   Hematological: Negative.   Psychiatric/Behavioral: Negative.    All other systems reviewed and are negative.  Past Medical History:  Diagnosis Date   Allergy    Colonic mass    Deviated nasal septum 01/14/14   per pt   Hyperlipidemia    Hypertension    Skin abnormalities    feet    Social History   Socioeconomic History   Marital status: Married    Spouse name: Not on file   Number of children: 2   Years of education: Not on file   Highest education level: Not on file  Occupational History   Occupation: Publishing rights manager    Employer: [employer]  Tobacco Use   Smoking status: Former    Types: Cigarettes    Quit date: 12/27/1993    Years since quitting: 29.5   Smokeless tobacco: Former    Types: Snuff    Quit date: 12/28/2007  Vaping Use   Vaping Use: Never used  Substance and Sexual Activity   Alcohol use: Yes    Alcohol/week: 4.0 standard drinks of alcohol    Types: 4 Glasses of wine per week   Drug use: No   Sexual activity: Not on file  Other Topics Concern   Not on file  Social History Narrative   Married   Two daughters - One lives at home.       Social Determinants of Health   Financial Resource Strain: Not on file  Food Insecurity: Not on file  Transportation  Needs: Not on file  Physical Activity: Not on file  Stress: Not on file  Social Connections: Not on file  Intimate Partner Violence: Not on file    Past Surgical History:  Procedure Laterality Date   COLON RESECTION  2013   COLONOSCOPY     HEMICOLECTOMY  05/10/12   INSERTION OF MESH N/A 01/23/2014   Procedure: OPEN INSERTION OF MESH;  Surgeon: Almond Lint, MD;  Location: [facility];  Service: General;  Laterality: N/A;   VASECTOMY     VENTRAL HERNIA REPAIR N/A 01/23/2014   Procedure: LAPAROSCOPIC VENTRAL HERNIA REPAIR;  Surgeon: Almond Lint, MD;  Location: [facility];  Service: General;  Laterality:  N/A;   WISDOM TOOTH EXTRACTION      Family History  Problem Relation Age of Onset   Lung cancer Father 17   Hypertension Mother    Hypertension Other    Colon cancer Neg Hx    Rectal cancer Neg Hx    Stomach cancer Neg Hx    Pancreatic cancer Neg Hx    Colon polyps Neg Hx    Esophageal cancer Neg Hx     No Known Allergies  Current Outpatient Medications on File Prior to Visit  Medication Sig Dispense Refill   Ascorbic Acid (VITAMIN C PO) Take 1 tablet by mouth daily.     aspirin 81 MG tablet Take 81 mg by mouth daily with breakfast.      cyclobenzaprine (FLEXERIL) 10 MG tablet Take 1 tablet (10 mg total) by mouth at bedtime as needed for muscle spasms. 15 tablet 0   dicyclomine (BENTYL) 10 MG capsule TAKE 1 CAPSULE (10 MG TOTAL) BY MOUTH 4 (FOUR) TIMES DAILY - BEFORE MEALS AND AT BEDTIME. 360 capsule 1   dicyclomine (BENTYL) 20 MG tablet Take 0.5 tablets (10 mg total) by mouth 2 (two) times daily as needed for up to 10 days for spasms. 10 tablet 0   doxycycline (VIBRA-TABS) 100 MG tablet Take 1 tablet (100 mg total) by mouth 2 (two) times daily. 14 tablet 0   lisinopril-hydrochlorothiazide (ZESTORETIC) 20-12.5 MG tablet TAKE 1 TABLET BY MOUTH EVERY DAY 90 tablet 1   Multiple Vitamin (MULTIVITAMIN) tablet Take 1 tablet by mouth daily with breakfast.      rosuvastatin (CRESTOR) 5 MG tablet TAKE 1 TABLET (5 MG TOTAL) BY MOUTH DAILY. 90 tablet 3   No current facility-administered medications on file prior to visit.    There were no vitals taken for this visit.      Objective:   Physical Exam Vitals and nursing note reviewed.  Constitutional:      General: He is not in acute distress.    Appearance: Normal appearance. He is not ill-appearing.  HENT:     Head: Normocephalic and atraumatic.     Right Ear: Tympanic membrane, ear canal and external ear normal. There is no impacted cerumen.     Left Ear: Tympanic membrane, ear canal and external ear normal. There is no impacted  cerumen.     Nose: Nose normal. No congestion or rhinorrhea.     Mouth/Throat:     Mouth: Mucous membranes are moist.     Pharynx: Oropharynx is clear.  Eyes:     Extraocular Movements: Extraocular movements intact.     Conjunctiva/sclera: Conjunctivae normal.     Pupils: Pupils are equal, round, and reactive to light.  Neck:     Vascular: No carotid bruit.  Cardiovascular:     Rate and Rhythm: Normal rate  and regular rhythm.     Pulses: Normal pulses.     Heart sounds: No murmur heard.    No friction rub. No gallop.  Pulmonary:     Effort: Pulmonary effort is normal.     Breath sounds: Normal breath sounds.  Abdominal:     General: Abdomen is flat. Bowel sounds are normal. There is no distension.     Palpations: Abdomen is soft. There is no mass.     Tenderness: There is no abdominal tenderness. There is no guarding or rebound.     Hernia: No hernia is present.  Musculoskeletal:        General: Normal range of motion.     Cervical back: Normal range of motion and neck supple.  Lymphadenopathy:     Cervical: No cervical adenopathy.  Skin:    General: Skin is warm and dry.     Capillary Refill: Capillary refill takes less than 2 seconds.  Neurological:     General: No focal deficit present.     Mental Status: He is alert and oriented to person, place, and time.  Psychiatric:        Mood and Affect: Mood normal.        Behavior: Behavior normal.        Thought Content: Thought content normal.        Judgment: Judgment normal.           Assessment & Plan:

## 2023-06-23 ENCOUNTER — Encounter: Payer: Self-pay | Admitting: Adult Health

## 2023-06-23 ENCOUNTER — Ambulatory Visit (INDEPENDENT_AMBULATORY_CARE_PROVIDER_SITE_OTHER): Payer: BC Managed Care – PPO | Admitting: Adult Health

## 2023-06-23 VITALS — BP 134/84 | HR 70 | Temp 98.0°F | Ht 72.0 in | Wt 183.0 lb

## 2023-06-23 DIAGNOSIS — J342 Deviated nasal septum: Secondary | ICD-10-CM | POA: Diagnosis not present

## 2023-06-23 DIAGNOSIS — J310 Chronic rhinitis: Secondary | ICD-10-CM | POA: Diagnosis not present

## 2023-06-23 DIAGNOSIS — I10 Essential (primary) hypertension: Secondary | ICD-10-CM | POA: Diagnosis not present

## 2023-06-23 DIAGNOSIS — Z0000 Encounter for general adult medical examination without abnormal findings: Secondary | ICD-10-CM | POA: Diagnosis not present

## 2023-06-23 DIAGNOSIS — Z125 Encounter for screening for malignant neoplasm of prostate: Secondary | ICD-10-CM

## 2023-06-23 DIAGNOSIS — N529 Male erectile dysfunction, unspecified: Secondary | ICD-10-CM

## 2023-06-23 DIAGNOSIS — R3912 Poor urinary stream: Secondary | ICD-10-CM | POA: Diagnosis not present

## 2023-06-23 DIAGNOSIS — N401 Enlarged prostate with lower urinary tract symptoms: Secondary | ICD-10-CM | POA: Diagnosis not present

## 2023-06-23 DIAGNOSIS — J343 Hypertrophy of nasal turbinates: Secondary | ICD-10-CM | POA: Diagnosis not present

## 2023-06-23 DIAGNOSIS — E782 Mixed hyperlipidemia: Secondary | ICD-10-CM | POA: Diagnosis not present

## 2023-06-23 LAB — CBC
HCT: 46 % (ref 39.0–52.0)
Hemoglobin: 15.4 g/dL (ref 13.0–17.0)
MCHC: 33.6 g/dL (ref 30.0–36.0)
MCV: 92 fl (ref 78.0–100.0)
Platelets: 219 10*3/uL (ref 150.0–400.0)
RBC: 5 Mil/uL (ref 4.22–5.81)
RDW: 13.5 % (ref 11.5–15.5)
WBC: 5.7 10*3/uL (ref 4.0–10.5)

## 2023-06-23 LAB — LIPID PANEL
Cholesterol: 122 mg/dL (ref 0–200)
HDL: 51.1 mg/dL (ref >39.00)
LDL Cholesterol: 56 mg/dL (ref 0–99)
NonHDL: 70.71
Total CHOL/HDL Ratio: 2
Triglycerides: 75 mg/dL (ref 0.0–149.0)
VLDL: 15 mg/dL (ref 0.0–40.0)

## 2023-06-23 LAB — COMPREHENSIVE METABOLIC PANEL
ALT: 22 U/L (ref 0–53)
AST: 21 U/L (ref 0–37)
Albumin: 4.3 g/dL (ref 3.5–5.2)
Alkaline Phosphatase: 36 U/L — ABNORMAL LOW (ref 39–117)
BUN: 14 mg/dL (ref 6–23)
CO2: 31 mEq/L (ref 19–32)
Calcium: 10 mg/dL (ref 8.4–10.5)
Chloride: 104 mEq/L (ref 96–112)
Creatinine, Ser: 1.04 mg/dL (ref 0.40–1.50)
GFR: 76.07 mL/min (ref >60.00)
Glucose, Bld: 98 mg/dL (ref 70–99)
Potassium: 4.8 mEq/L (ref 3.5–5.1)
Sodium: 141 mEq/L (ref 135–145)
Total Bilirubin: 0.5 mg/dL (ref 0.2–1.2)
Total Protein: 6.7 g/dL (ref 6.0–8.3)

## 2023-06-23 LAB — TSH: TSH: 0.97 u[IU]/mL (ref 0.35–5.50)

## 2023-06-23 LAB — PSA: PSA: 0.91 ng/mL (ref 0.10–4.00)

## 2023-06-23 NOTE — Patient Instructions (Addendum)
It was great seeing you today   We will follow up with you regarding your lab work   Please let me know if you need anything    Someone will call you to schedule your CT scan    

## 2023-06-24 ENCOUNTER — Other Ambulatory Visit: Payer: Self-pay | Admitting: Adult Health

## 2023-07-14 ENCOUNTER — Ambulatory Visit (HOSPITAL_COMMUNITY)
Admission: RE | Admit: 2023-07-14 | Discharge: 2023-07-14 | Disposition: A | Discharge status: Home / Self Care | Payer: BC Managed Care – PPO | Source: Ambulatory Visit | Attending: Adult Health | Admitting: Adult Health

## 2023-07-14 DIAGNOSIS — E782 Mixed hyperlipidemia: Secondary | ICD-10-CM | POA: Insufficient documentation

## 2023-07-19 ENCOUNTER — Other Ambulatory Visit: Payer: Self-pay

## 2023-07-19 MED ORDER — ROSUVASTATIN CALCIUM 10 MG PO TABS: 10.0000 mg | ORAL_TABLET | Freq: Every day | ORAL | 3 refills | Status: DC

## 2023-08-31 DIAGNOSIS — M5023 Other cervical disc displacement, cervicothoracic region: Secondary | ICD-10-CM | POA: Diagnosis not present

## 2023-08-31 DIAGNOSIS — M9903 Segmental and somatic dysfunction of lumbar region: Secondary | ICD-10-CM | POA: Diagnosis not present

## 2023-08-31 DIAGNOSIS — M7918 Myalgia, other site: Secondary | ICD-10-CM | POA: Diagnosis not present

## 2023-08-31 DIAGNOSIS — M9902 Segmental and somatic dysfunction of thoracic region: Secondary | ICD-10-CM | POA: Diagnosis not present

## 2023-08-31 DIAGNOSIS — M9901 Segmental and somatic dysfunction of cervical region: Secondary | ICD-10-CM | POA: Diagnosis not present

## 2023-08-31 DIAGNOSIS — M25619 Stiffness of unspecified shoulder, not elsewhere classified: Secondary | ICD-10-CM | POA: Diagnosis not present

## 2023-09-02 DIAGNOSIS — M7918 Myalgia, other site: Secondary | ICD-10-CM | POA: Diagnosis not present

## 2023-09-02 DIAGNOSIS — M5023 Other cervical disc displacement, cervicothoracic region: Secondary | ICD-10-CM | POA: Diagnosis not present

## 2023-09-02 DIAGNOSIS — M9902 Segmental and somatic dysfunction of thoracic region: Secondary | ICD-10-CM | POA: Diagnosis not present

## 2023-09-02 DIAGNOSIS — M9901 Segmental and somatic dysfunction of cervical region: Secondary | ICD-10-CM | POA: Diagnosis not present

## 2023-09-02 DIAGNOSIS — M9903 Segmental and somatic dysfunction of lumbar region: Secondary | ICD-10-CM | POA: Diagnosis not present

## 2023-09-02 DIAGNOSIS — M25619 Stiffness of unspecified shoulder, not elsewhere classified: Secondary | ICD-10-CM | POA: Diagnosis not present

## 2023-09-07 DIAGNOSIS — M7918 Myalgia, other site: Secondary | ICD-10-CM | POA: Diagnosis not present

## 2023-09-07 DIAGNOSIS — M25619 Stiffness of unspecified shoulder, not elsewhere classified: Secondary | ICD-10-CM | POA: Diagnosis not present

## 2023-09-07 DIAGNOSIS — M9902 Segmental and somatic dysfunction of thoracic region: Secondary | ICD-10-CM | POA: Diagnosis not present

## 2023-09-07 DIAGNOSIS — M9901 Segmental and somatic dysfunction of cervical region: Secondary | ICD-10-CM | POA: Diagnosis not present

## 2023-09-07 DIAGNOSIS — M9903 Segmental and somatic dysfunction of lumbar region: Secondary | ICD-10-CM | POA: Diagnosis not present

## 2023-09-07 DIAGNOSIS — M5023 Other cervical disc displacement, cervicothoracic region: Secondary | ICD-10-CM | POA: Diagnosis not present

## 2023-09-10 IMAGING — DX DG LUMBAR SPINE COMPLETE 4+V
5 series · 5 of 5 positions shown · non-contrast
Comparison: Lumbar spine x-ray 04/13/2010.

CLINICAL DATA: Lumbar spine pain.

EXAM:
LUMBAR SPINE - COMPLETE 4+ VIEW

[lumbar spine ap]
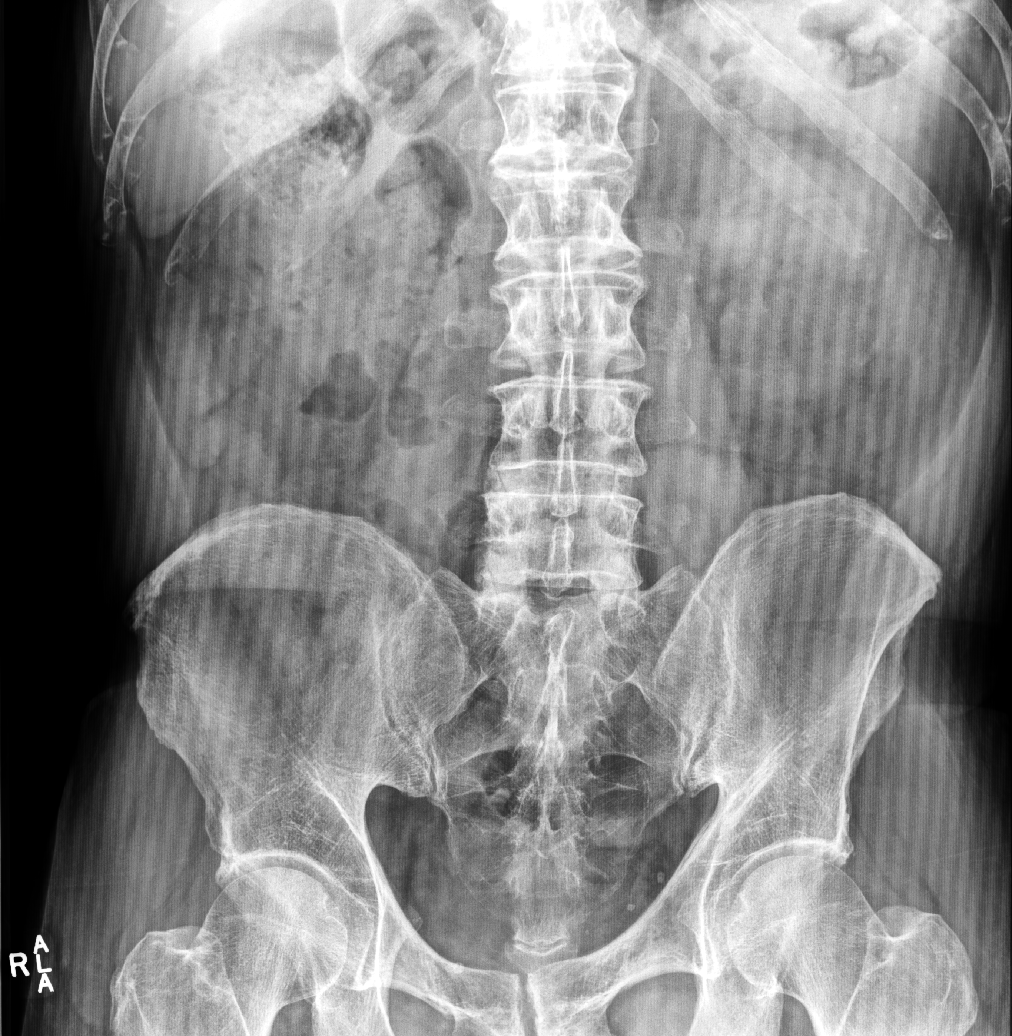

[lumbar spine oblique (1 of 2)]
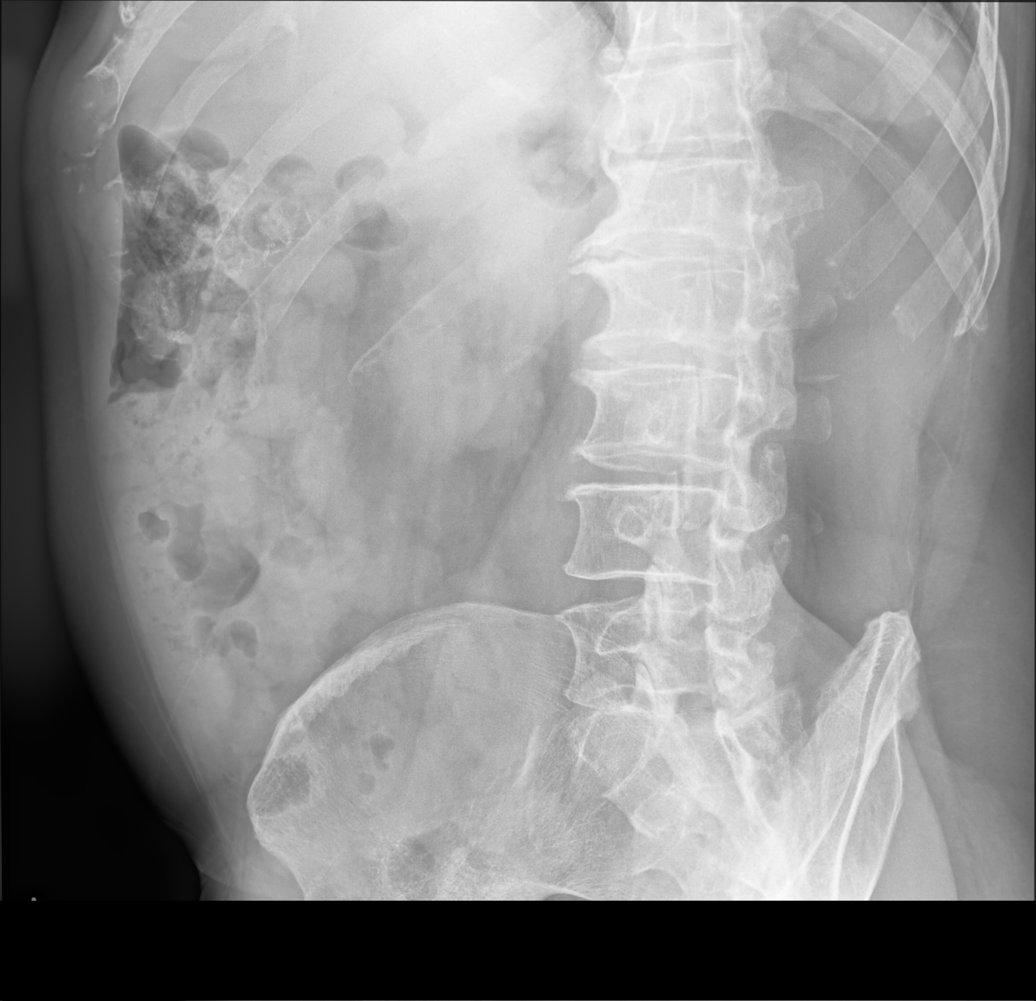

[lumbar spine oblique (2 of 2)]
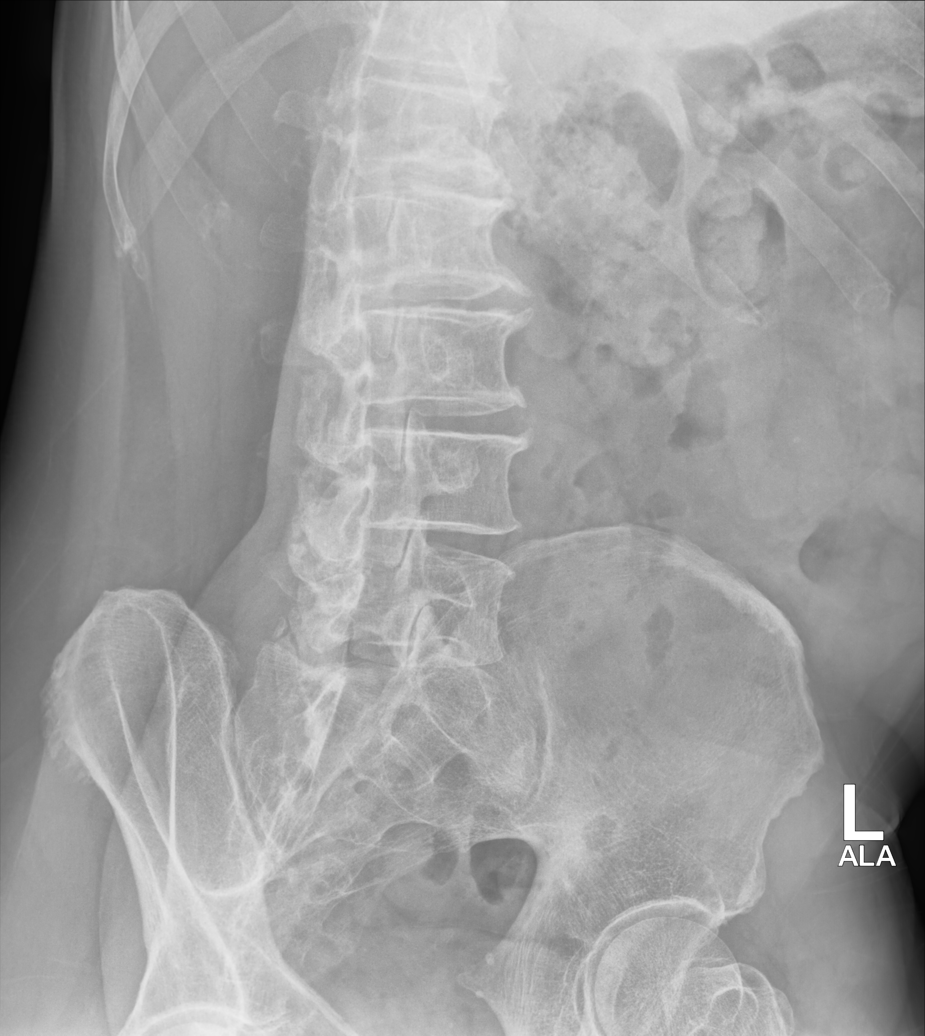

[lumbar spine lat (1 of 2)]
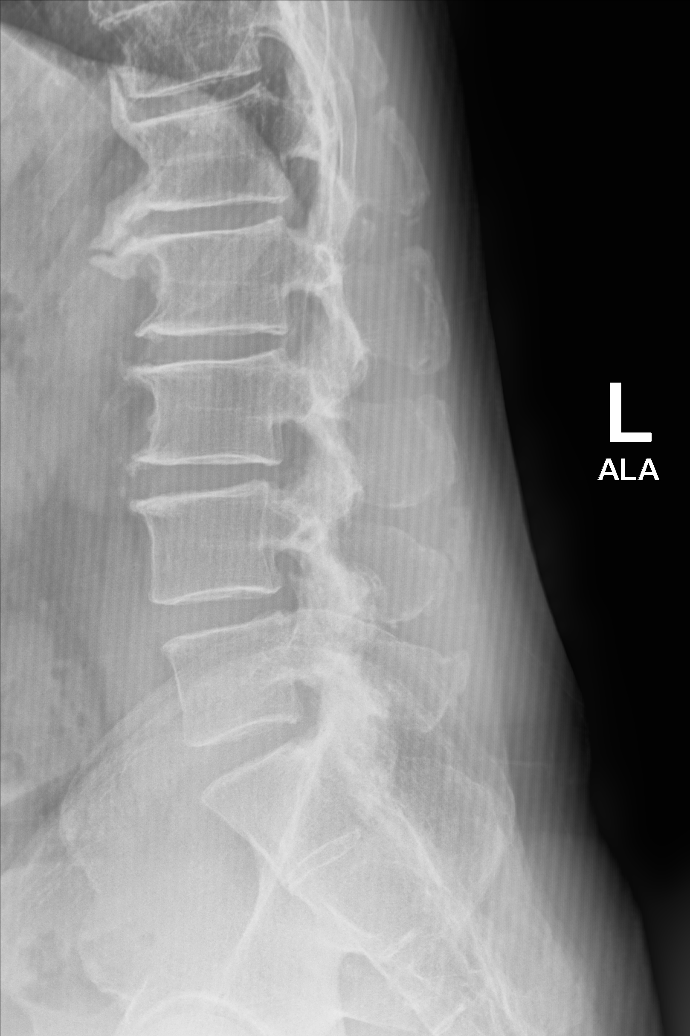

[lumbar spine lat (2 of 2)]
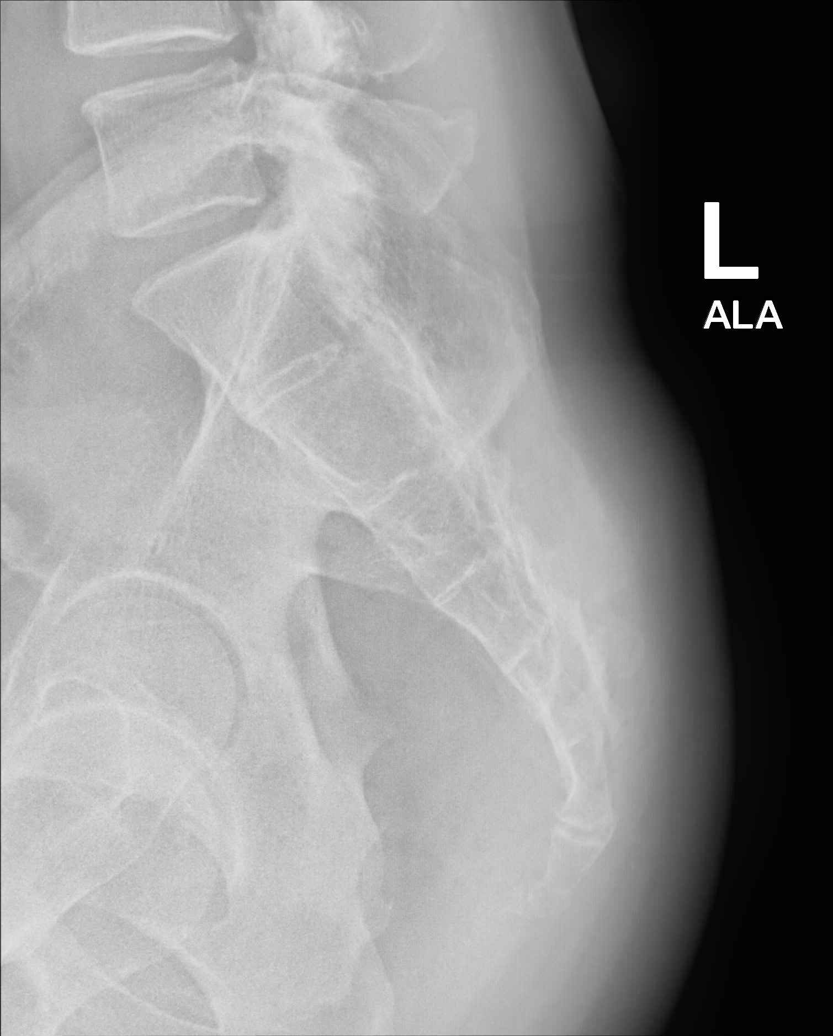

[5 of 5 positions shown; findings below may reference images not displayed]

FINDINGS: There is no evidence of lumbar spine fracture. Alignment is normal.
Intervertebral disc spaces are maintained. Degenerative endplate
osteophytes are seen at T12-L1, L1-L2, L2-L3 and L3-L4. These have
progressed compared to 5711. Soft tissues are within normal limits.
No focal osseous lesion.
IMPRESSION: 1. No evidence for fracture or malalignment.
2. Mild degenerative changes have progressed.

## 2023-09-10 IMAGING — DX DG SHOULDER 2+V*R*
3 series · 3 of 3 positions shown · non-contrast
Comparison: None Available.

CLINICAL DATA: Right shoulder pain for 8 months.

EXAM:
RIGHT SHOULDER - 2+ VIEW

[shoulder internal rotation ap]
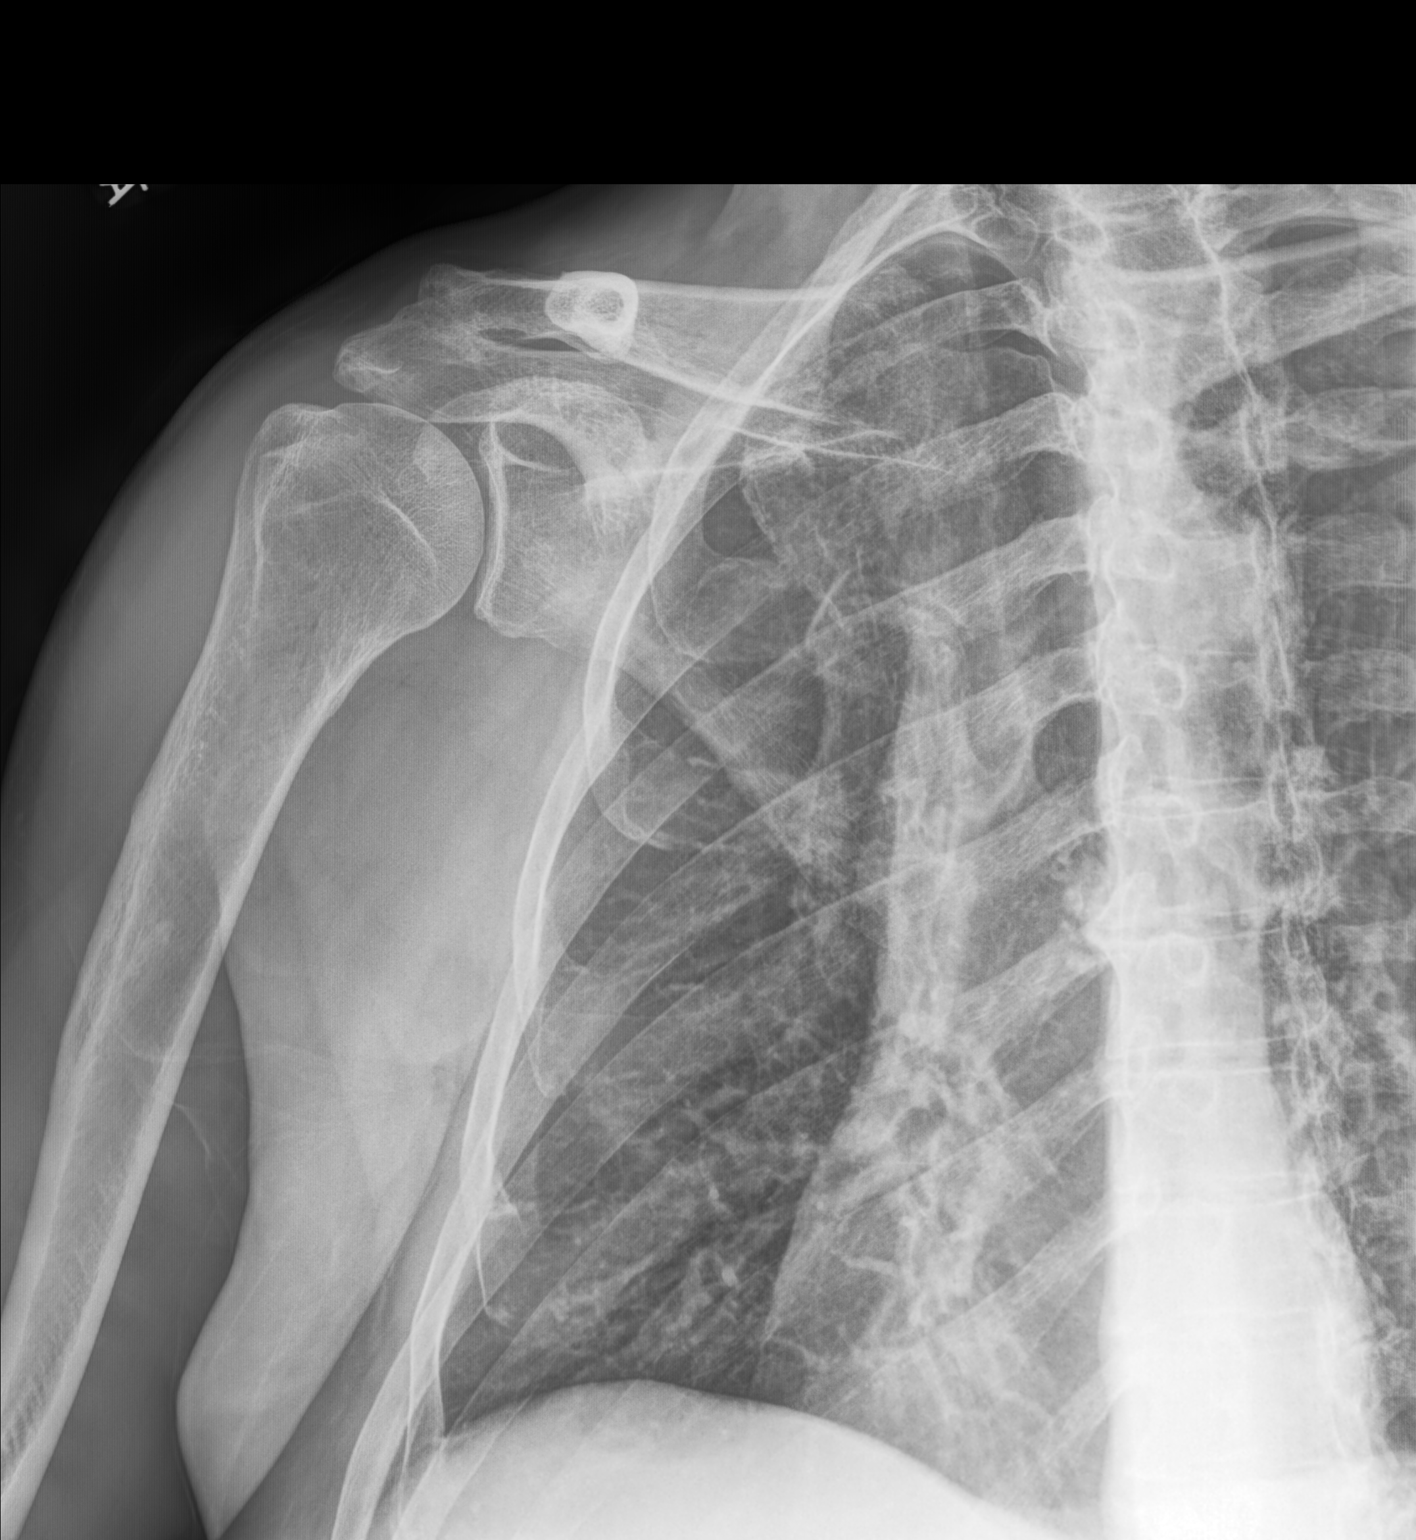

[shoulder (y view)]
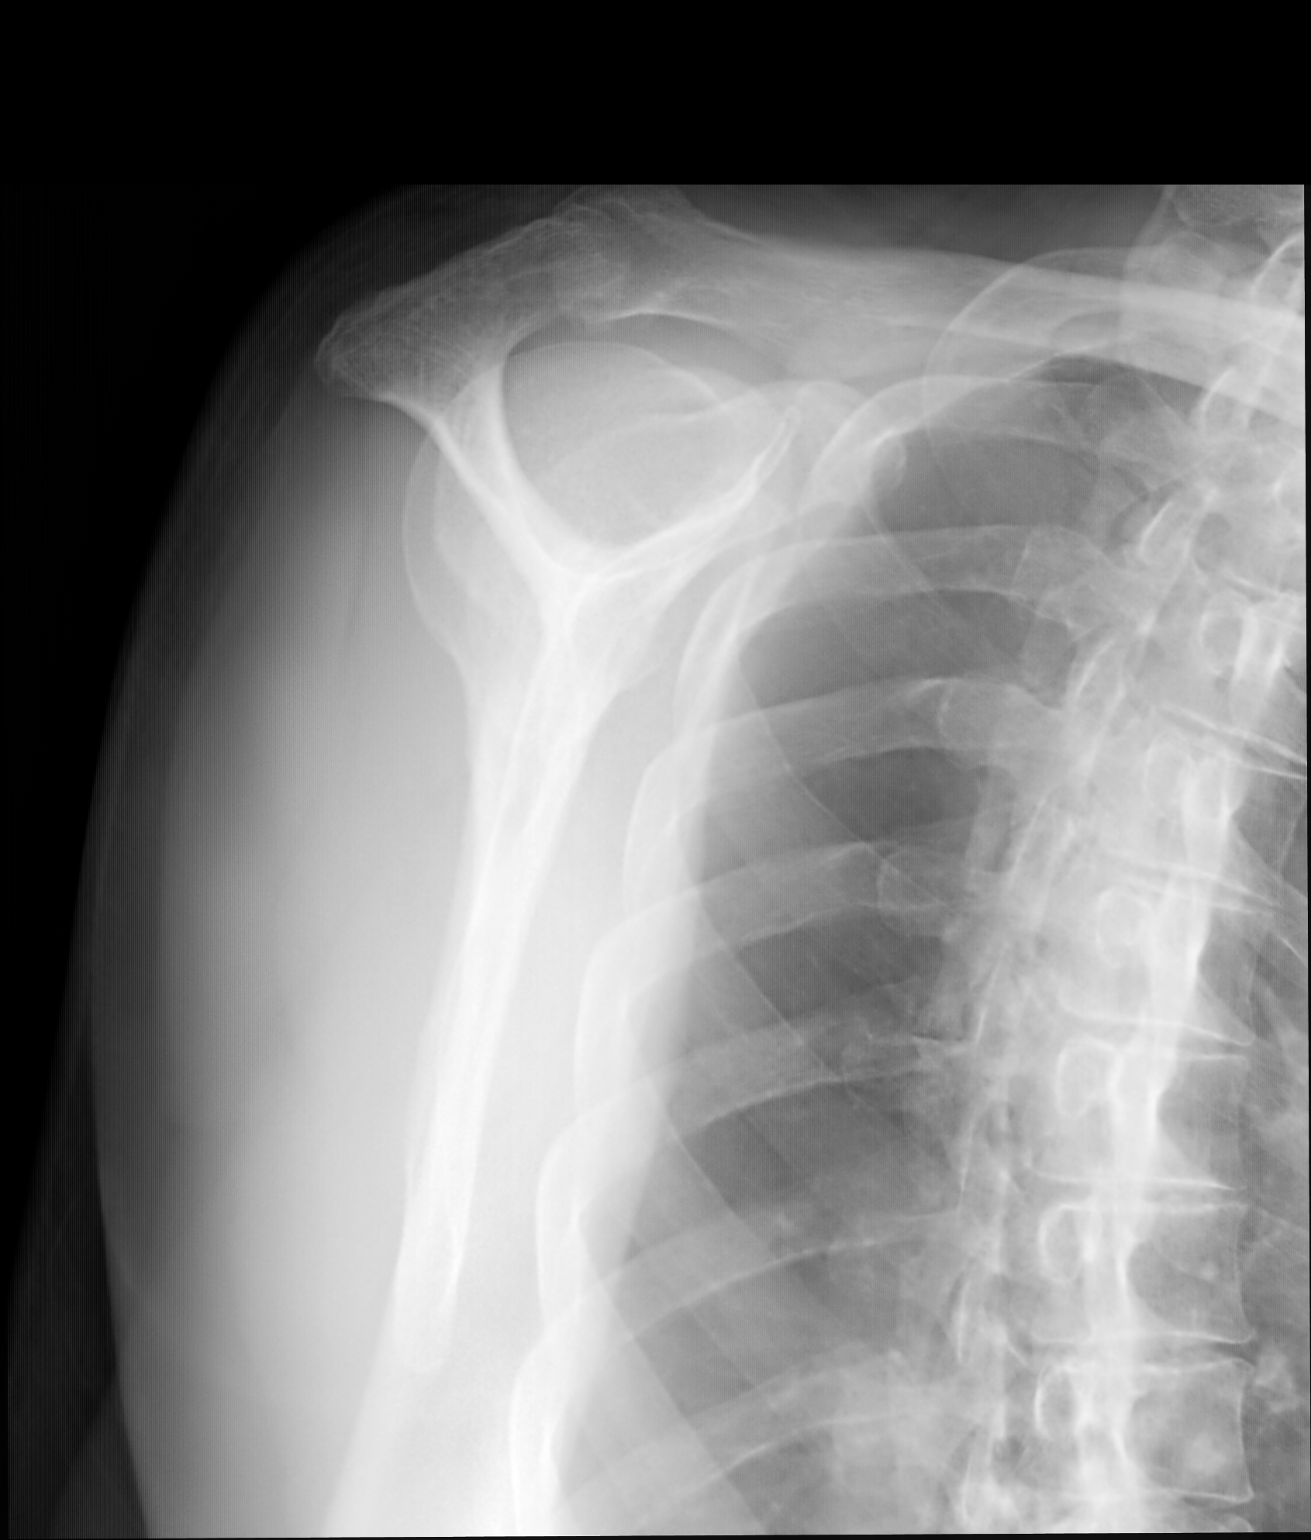

[shoulder (axial)]
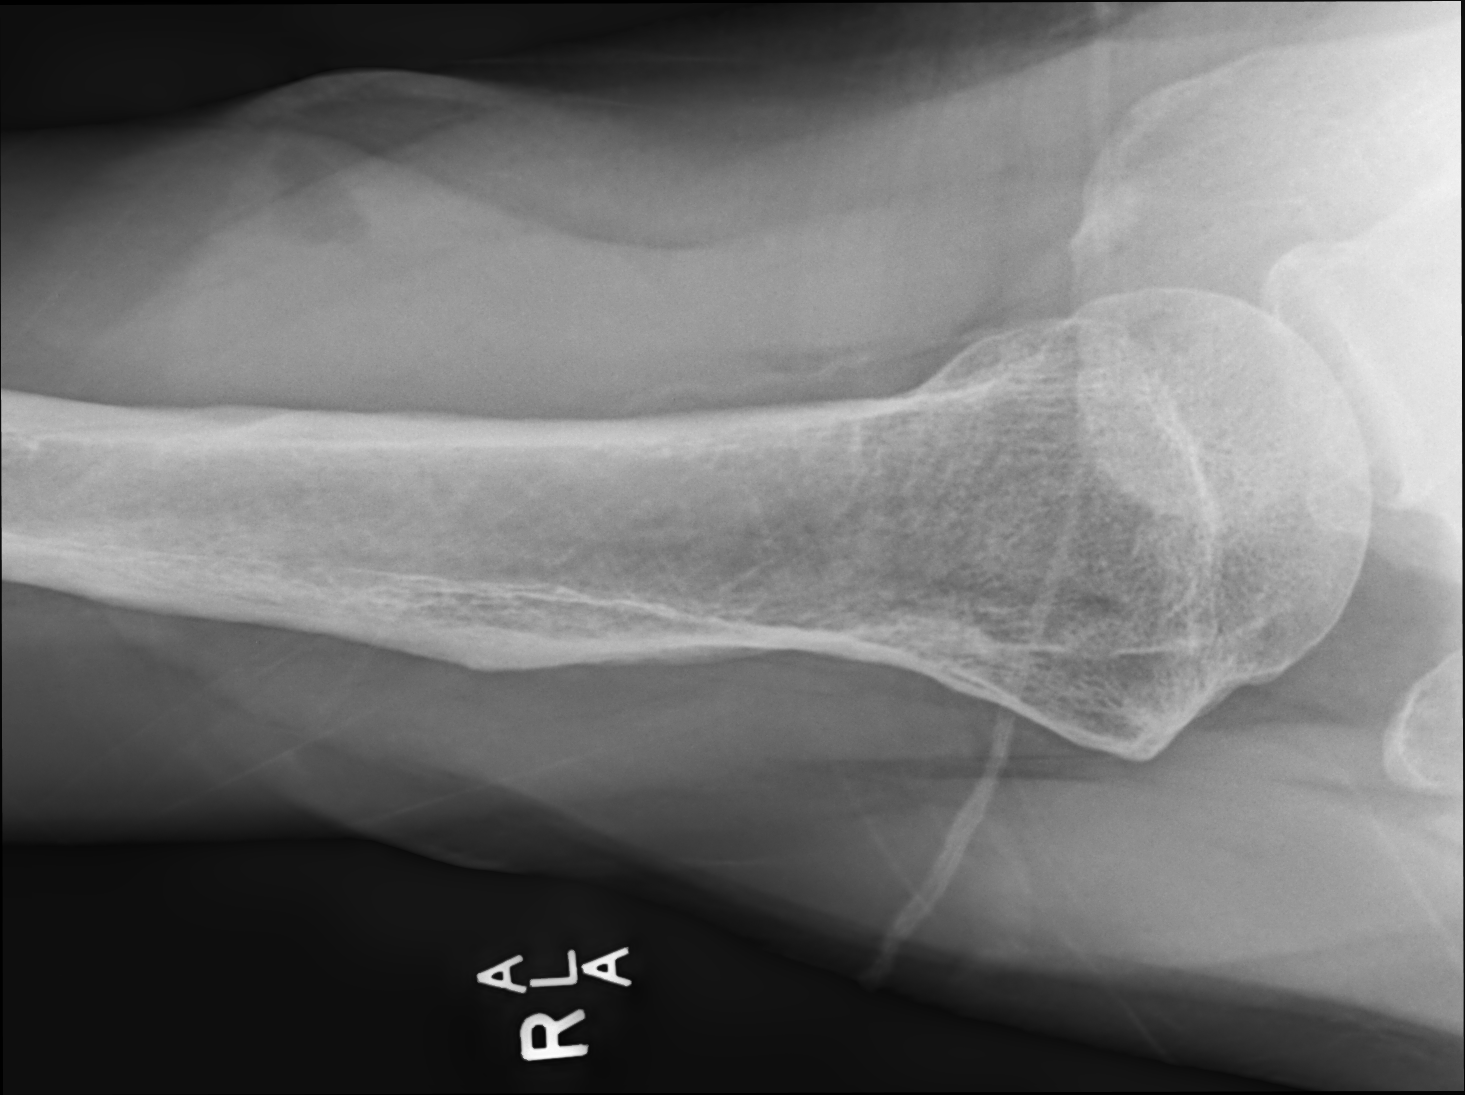

[3 of 3 positions shown; findings below may reference images not displayed]

FINDINGS: Moderate acromioclavicular joint space narrowing and peripheral
osteophytosis. No significant glenohumeral osteoarthritis. No acute
fracture or dislocation. The visualized portion of the right lung is
unremarkable.
IMPRESSION: Moderate right acromioclavicular osteoarthritis.

## 2023-09-22 ENCOUNTER — Ambulatory Visit: Payer: BC Managed Care – PPO | Admitting: Adult Health

## 2023-09-22 ENCOUNTER — Other Ambulatory Visit: Payer: Self-pay

## 2023-09-22 ENCOUNTER — Ambulatory Visit: Payer: BC Managed Care – PPO | Attending: Adult Health

## 2023-09-22 ENCOUNTER — Encounter: Payer: Self-pay | Admitting: Adult Health

## 2023-09-22 VITALS — BP 132/84 | HR 96 | Temp 98.3°F | Ht 72.0 in | Wt 180.0 lb

## 2023-09-22 DIAGNOSIS — M6281 Muscle weakness (generalized): Secondary | ICD-10-CM | POA: Insufficient documentation

## 2023-09-22 DIAGNOSIS — M542 Cervicalgia: Secondary | ICD-10-CM | POA: Insufficient documentation

## 2023-09-22 DIAGNOSIS — T148XXA Other injury of unspecified body region, initial encounter: Secondary | ICD-10-CM

## 2023-09-22 DIAGNOSIS — R293 Abnormal posture: Secondary | ICD-10-CM | POA: Insufficient documentation

## 2023-09-22 DIAGNOSIS — R252 Cramp and spasm: Secondary | ICD-10-CM | POA: Diagnosis not present

## 2023-09-22 MED ORDER — CYCLOBENZAPRINE HCL 10 MG PO TABS
10.0000 mg | ORAL_TABLET | Freq: Every day | ORAL | 0 refills | Status: DC
Start: 2023-09-22 — End: 2024-09-26

## 2023-09-22 MED ORDER — METHYLPREDNISOLONE 4 MG PO TBPK: ORAL_TABLET | ORAL | 0 refills | Status: DC

## 2023-09-22 NOTE — Progress Notes (Signed)
Subjective:    Patient ID: Jesse Arroyo, male    DOB: 1961-12-21, 62 y.o.   MRN: [id number]  HPI 62 year old male who  has a past medical history of Allergy, Colonic mass, Deviated nasal septum (01/14/14), Hyperlipidemia, Hypertension, and Skin abnormalities.  He presents to the office today for bilateral upper back/shoulder pain.  Does not necessarily feel like the pain is coming from the shoulder joint itself but more so of the muscles in his upper back and between his shoulder blades.  He has a jugular and a cyclist and reports that this causes worsening discomfort.  He has started going to see a massage therapist and finds this useful.  When he finds a pressure point and pushes on this muscle can cause tingling down his arm but he really does not have much tingling when he is not pushing on pressure points.  He was as though he has loss of range of motion in his neck from the tight muscles    Review of Systems See HPI   Past Medical History:  Diagnosis Date   Allergy    Colonic mass    Deviated nasal septum 01/14/14   per pt   Hyperlipidemia    Hypertension    Skin abnormalities    feet    Social History   Socioeconomic History   Marital status: Married    Spouse name: Not on file   Number of children: 2   Years of education: Not on file   Highest education level: Not on file  Occupational History   Occupation: Chief Technology Officer: [employer]  Tobacco Use   Smoking status: Former    Current packs/day: 0.00    Types: Cigarettes    Quit date: 12/27/1993    Years since quitting: 29.7   Smokeless tobacco: Former    Types: Snuff    Quit date: 12/28/2007  Vaping Use   Vaping status: Never Used  Substance and Sexual Activity   Alcohol use: Yes    Alcohol/week: 4.0 standard drinks of alcohol    Types: 4 Glasses of wine per week   Drug use: No   Sexual activity: Not on file  Other Topics Concern   Not on file  Social History Narrative    Married   Two daughters - One lives at home.       Social Determinants of Health   Financial Resource Strain: Not on file  Food Insecurity: Not on file  Transportation Needs: Not on file  Physical Activity: Not on file  Stress: Not on file  Social Connections: Not on file  Intimate Partner Violence: Not on file    Past Surgical History:  Procedure Laterality Date   COLON RESECTION  2013   COLONOSCOPY     HEMICOLECTOMY  05/10/12   INSERTION OF MESH N/A 01/23/2014   Procedure: OPEN INSERTION OF MESH;  Surgeon: Almond Lint, MD;  Location: [facility];  Service: General;  Laterality: N/A;   VASECTOMY     VENTRAL HERNIA REPAIR N/A 01/23/2014   Procedure: LAPAROSCOPIC VENTRAL HERNIA REPAIR;  Surgeon: Almond Lint, MD;  Location: [facility];  Service: General;  Laterality: N/A;   WISDOM TOOTH EXTRACTION      Family History  Problem Relation Age of Onset   Lung cancer Father 79   Hypertension Mother    Hypertension Other    Colon cancer Neg Hx    Rectal cancer Neg Hx  Stomach cancer Neg Hx    Pancreatic cancer Neg Hx    Colon polyps Neg Hx    Esophageal cancer Neg Hx     No Known Allergies  Current Outpatient Medications on File Prior to Visit  Medication Sig Dispense Refill   Ascorbic Acid (VITAMIN C PO) Take 1 tablet by mouth daily.     aspirin 81 MG tablet Take 81 mg by mouth daily with breakfast.      dicyclomine (BENTYL) 10 MG capsule TAKE 1 CAPSULE (10 MG TOTAL) BY MOUTH 4 (FOUR) TIMES DAILY - BEFORE MEALS AND AT BEDTIME. 360 capsule 1   fluticasone (FLONASE) 50 MCG/ACT nasal spray SMARTSIG:1-2 Spray(s) Both Nares Daily     lisinopril-hydrochlorothiazide (ZESTORETIC) 20-12.5 MG tablet TAKE 1 TABLET BY MOUTH EVERY DAY 90 tablet 1   Multiple Vitamin (MULTIVITAMIN) tablet Take 1 tablet by mouth daily with breakfast.      rosuvastatin (CRESTOR) 10 MG tablet Take 1 tablet (10 mg total) by mouth daily. 90 tablet 3   Zinc 50 MG TABS Take by mouth.     No current  facility-administered medications on file prior to visit.    BP 132/84   Pulse 96   Temp 98.3 F (36.8 C) (Oral)   Ht 6' (1.829 m)   Wt 180 lb (81.6 kg)   SpO2 97%   BMI 24.41 kg/m       Objective:   Physical Exam Vitals and nursing note reviewed.  Constitutional:      Appearance: Normal appearance.  Musculoskeletal:        General: Tenderness present.     Right shoulder: No bony tenderness. Normal range of motion. Normal strength.     Left shoulder: No bony tenderness. Normal range of motion. Normal strength.     Comments: He does have tight trapezius and Levitt her scapular muscles bilaterally.  Skin:    General: Skin is warm and dry.  Neurological:     General: No focal deficit present.     Mental Status: He is alert and oriented to person, place, and time.  Psychiatric:        Mood and Affect: Mood normal.        Behavior: Behavior normal.        Thought Content: Thought content normal.        Judgment: Judgment normal.        Assessment & Plan:  1. Muscle strain I think he would be a good candidate for physical therapy for dry needling for this issue.  This seems to be more muscular in nature.  Will also prescribe a Medrol Dosepak and Flexeril to help with his discomfort.  He was given an exercise band that can help with stretching and reviewed stretching exercises with him. - cyclobenzaprine (FLEXERIL) 10 MG tablet; Take 1 tablet (10 mg total) by mouth at bedtime.  Dispense: 15 tablet; Refill: 0 - methylPREDNISolone (MEDROL DOSEPAK) 4 MG TBPK tablet; Take as directed  Dispense: 21 tablet; Refill: 0 - Ambulatory referral to Physical Therapy  Time spent with patient today was 31 minutes which consisted of chart review, discussing muscular pain, work up, treatment answering questions, listening  and documentation.

## 2023-09-22 NOTE — Patient Instructions (Addendum)
I am going to refer you to PT and I have sent in a medrol dose pack and muscle relaxer for your shoulder pain   [facility]  Address: [address] [place], [place], [place] [address] Phone: [phone]

## 2023-09-23 ENCOUNTER — Encounter: Payer: Self-pay | Admitting: Rehabilitative and Restorative Service Providers"

## 2023-09-23 ENCOUNTER — Ambulatory Visit: Payer: BC Managed Care – PPO | Admitting: Rehabilitative and Restorative Service Providers"

## 2023-09-23 DIAGNOSIS — M6281 Muscle weakness (generalized): Secondary | ICD-10-CM

## 2023-09-23 DIAGNOSIS — M542 Cervicalgia: Secondary | ICD-10-CM | POA: Diagnosis not present

## 2023-09-23 DIAGNOSIS — R252 Cramp and spasm: Secondary | ICD-10-CM | POA: Diagnosis not present

## 2023-09-23 DIAGNOSIS — R293 Abnormal posture: Secondary | ICD-10-CM | POA: Diagnosis not present

## 2023-09-23 NOTE — Therapy (Signed)
OUTPATIENT PHYSICAL THERAPY TREATMENT NOTE   Patient Name: Jesse Arroyo MRN: [id number] DOB:May 28, 1961, 62 y.o., male Today's Date: 09/23/2023  END OF SESSION:  PT End of Session - 09/23/23 1016     Visit Number 2    Date for PT Re-Evaluation 11/18/23    Authorization Type BCBS    Authorization - Visit Number 4    Authorization - Number of Visits 30    PT Start Time 1015    PT Stop Time 1055    PT Time Calculation (min) 40 min    Activity Tolerance Patient tolerated treatment well    Behavior During Therapy [facility] for tasks assessed/performed             Past Medical History:  Diagnosis Date   Allergy    Colonic mass    Deviated nasal septum 01/14/14   per pt   Hyperlipidemia    Hypertension    Skin abnormalities    feet   Past Surgical History:  Procedure Laterality Date   COLON RESECTION  2013   COLONOSCOPY     HEMICOLECTOMY  05/10/12   INSERTION OF MESH N/A 01/23/2014   Procedure: OPEN INSERTION OF MESH;  Surgeon: Almond Lint, MD;  Location: [facility];  Service: General;  Laterality: N/A;   VASECTOMY     VENTRAL HERNIA REPAIR N/A 01/23/2014   Procedure: LAPAROSCOPIC VENTRAL HERNIA REPAIR;  Surgeon: Almond Lint, MD;  Location: [facility];  Service: General;  Laterality: N/A;   WISDOM TOOTH EXTRACTION     Patient Active Problem List   Diagnosis Date Noted   Herpes zoster without complication 01/25/2023   Routine general medical examination at a health care facility 02/04/2015   Tear of medial cartilage or meniscus of knee, current 03/12/2014   Ventral hernia 01/23/2014   Incisional hernia 01/02/2013   Dysplastic nevi 07/31/2012   Hematuria, gross 07/26/2012   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 04/14/2012   Plantar fasciitis 01/20/2012   ERECTILE DYSFUNCTION, ORGANIC 04/09/2010   DISC DISEASE, LUMBAR 09/28/2008   Essential hypertension 06/25/2008   ALLERGIC RHINITIS 06/25/2008    PCP: Shirline Frees, NP  REFERRING PROVIDER:  Shirline Frees, NP  REFERRING DIAG: T14.8XXA (ICD-10-CM) - Muscle strain  THERAPY DIAG:  Cervicalgia  Muscle weakness (generalized)  Cramp and spasm  Abnormal posture  Rationale for Evaluation and Treatment: Rehabilitation  ONSET DATE: 07/28/23  SUBJECTIVE:  SUBJECTIVE STATEMENT: Patient states that he is having some increased pain, states that he has been doing the exercises he was provided yesterday without any increased pain.  Hand dominance: Right  PERTINENT HISTORY:  Abdominal mesh due to colon resection.   PAIN:   Are you having pain? Yes: NPRS scale: 5/10 Pain location: bilateral upper traps and neck Pain description: aching and tight Aggravating factors: juggling and riding his bike (distances of up to 22 miles round trips) Relieving factors: Tylenol  PRECAUTIONS: Other: abdominal mesh due to colon resection  RED FLAGS: None     WEIGHT BEARING RESTRICTIONS: No  FALLS:  Has patient fallen in last 6 months? No  LIVING ENVIRONMENT: Lives with: lives with their spouse Lives in: House/apartment  OCCUPATION: juggler  PLOF: Independent, Independent with basic ADLs, Independent with household mobility without device, Independent with community mobility without device, Independent with homemaking with ambulation, Independent with gait, and Independent with transfers  PATIENT GOALS: His goal is to relieve his symptoms so that he is able to continue those activities that he enjoys.  NEXT MD VISIT: prn  OBJECTIVE:   DIAGNOSTIC FINDINGS:  Right shoulder Radiograph on 06/01/2022: IMPRESSION: Moderate right acromioclavicular osteoarthritis.  PATIENT SURVEYS:  09/23/2023:  NDI 11 / 50 = 22.0 %  COGNITION: Overall cognitive status: Within functional limits for tasks  assessed  SENSATION: WFL  POSTURE: rounded shoulders and forward head  PALPATION: Significant tension, taut bands and trigger points bilateral upper traps and levator,  L > R   CERVICAL ROM:   Active ROM A/PROM (deg) eval  Flexion WNL  Extension 75%  Right lateral flexion 75%  Left lateral flexion WNL  Right rotation 75%  Left rotation WNL   (Blank rows = not tested)  UPPER EXTREMITY ROM:  WFL - Impingement pain on left shoulder at approx 95-100 degrees flexion and abduction  UPPER EXTREMITY MMT:  Right UE generally 4+ to 5/5,  Left UE generally 4+/5 with exception of shoulder flexion, abduction, scaption with IR and ER and ER at 0 degrees abduction.    CERVICAL SPECIAL TESTS:  Spurling's test: Negative Special tests for shoulder: Positive Neers and empty can    TODAY'S TREATMENT:                                                                                                                               DATE: 09/23/2023 UBE level 1.0 x3 min each direction with PT present to discuss status Neck Disability Index score: 11 / 50 = 22.0 % 4D scapular stabilization on wall x20 each bilat Standing facing wall "W" lower trap lift offs 2x10 Standing shoulder rows with red tband 2x10 Standing shoulder extension with red tband 2x10 Standing shoulder ER with red tband 2x10 Standing shoulder horizontal abduction with red tband 2x10 Seated thoracic/cervical extension with ball behind back x20 Trigger Point Dry-Needling  Treatment instructions: Expect mild to moderate muscle soreness. S/S of pneumothorax if dry needled  over a lung field, and to seek immediate medical attention should they occur. Patient verbalized understanding of these instructions and education. Patient Consent Given: Yes Education handout provided: Yes Muscles treated: Bilateral cervical multifidi, bilat upper traps, bilat rhomboids, bilat delts Electrical stimulation performed: No Parameters: N/A Treatment  response/outcome: Utilized skilled palpation to identify bony landmarks and trigger points.  Able to illicit twitch response and muscle elongation.  Soft tissue mobilization following to further promote tissue elongation.      PATIENT EDUCATION:  Education details: Initiated HEP Person educated: Patient Education method: Programmer, multimedia, Facilities manager, Verbal cues, and Handouts Education comprehension: verbalized understanding, returned demonstration, and verbal cues required  HOME EXERCISE PROGRAM: Access Code: [email or web address] URL: [email or web address] Date: 09/23/2023 Prepared by: Mikey Kirschner  Exercises - Prone Shoulder Extension - Single Arm  - 2 x daily - 7 x weekly - 2 sets - 10 reps - Prone Shoulder Row  - 2 x daily - 7 x weekly - 2 sets - 10 reps - Prone Single Arm Shoulder Horizontal Abduction with Scapular Retraction and Palm Down  - 2 x daily - 7 x weekly - 2 sets - 10 reps - Sidelying Shoulder External Rotation  - 2 x daily - 7 x weekly - 2 sets - 10 reps - Single Arm Serratus Punches in Supine with Dumbbell  - 2 x daily - 7 x weekly - 2 sets - 10 reps - Standing Shoulder Flexion to 90 Degrees with Dumbbells  - 2 x daily - 7 x weekly - 2 sets - 10 reps - Standing Shoulder Scaption  - 2 x daily - 7 x weekly - 2 sets - 10 reps  ASSESSMENT:  CLINICAL IMPRESSION: Mr Coots presents to session reporting that he is still having pain and is interested in dry needling today.  Patient able to progress with strengthening exercises during session with minimal cuing for technique.  Patient reported that the cervical/thoracic extension with ball behind his back felt good. Patient with good response to dry needling with great twitch response noted with bilateral deltoids.  Patient has planned deviated septum surgery approaching in a couple of weeks.  OBJECTIVE IMPAIRMENTS: decreased ROM, decreased strength, increased fascial restrictions, increased muscle spasms, impaired  flexibility, impaired UE functional use, postural dysfunction, and pain.   ACTIVITY LIMITATIONS: carrying, lifting, transfers, and reach over head  PARTICIPATION LIMITATIONS: meal prep, cleaning, laundry, driving, and yard work  PERSONAL FACTORS: Past/current experiences and Profession are also affecting patient's functional outcome.   REHAB POTENTIAL: Good  CLINICAL DECISION MAKING: Stable/uncomplicated  EVALUATION COMPLEXITY: Low   GOALS: Goals reviewed with patient? Yes  SHORT TERM GOALS: Target date: 10/21/2023   Pain report to be no greater than 4/10  Baseline:  Goal status: Ongoing  2.  Patient will be independent with initial HEP  Baseline:  Goal status: Ongoing  LONG TERM GOALS: Target date: 11/18/2023   Patient to report pain no greater than 2/10  Baseline:  Goal status: INITIAL  2.  Patient to be independent with advanced HEP  Baseline:  Goal status: INITIAL  3.  Patient to be able to sleep through the night  Baseline:  Goal status: INITIAL  4.  Patient to report 85% improvement in overall symptoms  Baseline:  Goal status: INITIAL  5.  Patient to be able to juggle without restriction or limitations Baseline:  Goal status: INITIAL  6.  Patient will be able to reach overhead into cabinets and on top of shelves without pain  Baseline:  Goal status: INITIAL   PLAN:  PT FREQUENCY: 1-2x/week  PT DURATION: 8 weeks  PLANNED INTERVENTIONS: Therapeutic exercises, Therapeutic activity, Neuromuscular re-education, Balance training, Gait training, Patient/Family education, Self Care, Joint mobilization, Vestibular training, Canalith repositioning, Aquatic Therapy, Dry Needling, Electrical stimulation, Spinal mobilization, Cryotherapy, Moist heat, Splintting, Taping, Traction, Ultrasound, Biofeedback, Ionotophoresis 4mg /ml Dexamethasone, Manual therapy, and Re-evaluation  PLAN FOR NEXT SESSION: Do FOTO or NDI, dry needling to upper traps and levator  bilaterally, review HEP   Reather Laurence, PT, DPT 09/23/23, 11:11 AM  [facility] [address] [place], [place] [address] Phone # [phone] Fax [phone]

## 2023-09-23 NOTE — Patient Instructions (Signed)

## 2023-09-26 ENCOUNTER — Ambulatory Visit: Payer: BC Managed Care – PPO

## 2023-09-26 DIAGNOSIS — M6281 Muscle weakness (generalized): Secondary | ICD-10-CM

## 2023-09-26 DIAGNOSIS — R252 Cramp and spasm: Secondary | ICD-10-CM | POA: Diagnosis not present

## 2023-09-26 DIAGNOSIS — M542 Cervicalgia: Secondary | ICD-10-CM | POA: Diagnosis not present

## 2023-09-26 DIAGNOSIS — R293 Abnormal posture: Secondary | ICD-10-CM

## 2023-09-26 NOTE — Therapy (Signed)
OUTPATIENT PHYSICAL THERAPY TREATMENT NOTE   Patient Name: Jesse Arroyo MRN: [id number] DOB:10/30/1961, 62 y.o., male Today's Date: 09/26/2023  END OF SESSION:  PT End of Session - 09/26/23 1011     Visit Number 3    Date for PT Re-Evaluation 11/18/23    Authorization Type BCBS    PT Start Time [phone]    PT Stop Time 1011    PT Time Calculation (min) 40 min    Activity Tolerance Patient tolerated treatment well    Behavior During Therapy [facility] for tasks assessed/performed              Past Medical History:  Diagnosis Date   Allergy    Colonic mass    Deviated nasal septum 01/14/14   per pt   Hyperlipidemia    Hypertension    Skin abnormalities    feet   Past Surgical History:  Procedure Laterality Date   COLON RESECTION  2013   COLONOSCOPY     HEMICOLECTOMY  05/10/12   INSERTION OF MESH N/A 01/23/2014   Procedure: OPEN INSERTION OF MESH;  Surgeon: Almond Lint, MD;  Location: [facility];  Service: General;  Laterality: N/A;   VASECTOMY     VENTRAL HERNIA REPAIR N/A 01/23/2014   Procedure: LAPAROSCOPIC VENTRAL HERNIA REPAIR;  Surgeon: Almond Lint, MD;  Location: [facility];  Service: General;  Laterality: N/A;   WISDOM TOOTH EXTRACTION     Patient Active Problem List   Diagnosis Date Noted   Herpes zoster without complication 01/25/2023   Routine general medical examination at a health care facility 02/04/2015   Tear of medial cartilage or meniscus of knee, current 03/12/2014   Ventral hernia 01/23/2014   Incisional hernia 01/02/2013   Dysplastic nevi 07/31/2012   Hematuria, gross 07/26/2012   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 04/14/2012   Plantar fasciitis 01/20/2012   ERECTILE DYSFUNCTION, ORGANIC 04/09/2010   DISC DISEASE, LUMBAR 09/28/2008   Essential hypertension 06/25/2008   ALLERGIC RHINITIS 06/25/2008    PCP: Shirline Frees, NP  REFERRING PROVIDER: Shirline Frees, NP  REFERRING DIAG: T14.8XXA (ICD-10-CM) - Muscle  strain  THERAPY DIAG:  Cervicalgia  Muscle weakness (generalized)  Cramp and spasm  Abnormal posture  Rationale for Evaluation and Treatment: Rehabilitation  ONSET DATE: 07/28/23  SUBJECTIVE:                                                                                                                                                                                                         SUBJECTIVE STATEMENT: My  neck hurts.  Exercises are going well.  I liked the needling.   Hand dominance: Right  PERTINENT HISTORY:  Abdominal mesh due to colon resection.   PAIN:   Are you having pain? Yes: NPRS scale: 5-6/10 Pain location: bilateral upper traps and neck Pain description: aching and tight Aggravating factors: juggling and riding his bike (distances of up to 22 miles round trips) Relieving factors: Tylenol  PRECAUTIONS: Other: abdominal mesh due to colon resection  RED FLAGS: None     WEIGHT BEARING RESTRICTIONS: No  FALLS:  Has patient fallen in last 6 months? No  LIVING ENVIRONMENT: Lives with: lives with their spouse Lives in: House/apartment  OCCUPATION: juggler  PLOF: Independent, Independent with basic ADLs, Independent with household mobility without device, Independent with community mobility without device, Independent with homemaking with ambulation, Independent with gait, and Independent with transfers  PATIENT GOALS: His goal is to relieve his symptoms so that he is able to continue those activities that he enjoys.  NEXT MD VISIT: prn  OBJECTIVE:   DIAGNOSTIC FINDINGS:  Right shoulder Radiograph on 06/01/2022: IMPRESSION: Moderate right acromioclavicular osteoarthritis.  PATIENT SURVEYS:  09/23/2023:  NDI 11 / 50 = 22.0 %  COGNITION: Overall cognitive status: Within functional limits for tasks assessed  SENSATION: WFL  POSTURE: rounded shoulders and forward head  PALPATION: Significant tension, taut bands and trigger points bilateral  upper traps and levator,  L > R   CERVICAL ROM:   Active ROM A/PROM (deg) eval  Flexion WNL  Extension 75%  Right lateral flexion 75%  Left lateral flexion WNL  Right rotation 75%  Left rotation WNL   (Blank rows = not tested)  UPPER EXTREMITY ROM: WFL - Impingement pain on left shoulder at approx 95-100 degrees flexion and abduction  UPPER EXTREMITY MMT:  Right UE generally 4+ to 5/5,  Left UE generally 4+/5 with exception of shoulder flexion, abduction, scaption with IR and ER and ER at 0 degrees abduction.    CERVICAL SPECIAL TESTS:  Spurling's test: Negative Special tests for shoulder: Positive Neers and empty can    TODAY'S TREATMENT:                                                                                                                              DATE: 09/26/2023 UBE level 1.5 x3 min each direction with PT present to discuss status Seated upper trap stretch 2x20 seconds  Ball roll outs 3 ways x 5 second hold  4D scapular stabilization on wall x20 each bilat Standing facing wall "W" lower trap lift offs 2x10 Standing shoulder rows with red tband 2x10 Standing shoulder extension with red tband 2x10 Standing shoulder ER with red tband 2x10 Standing shoulder horizontal abduction with red tband 2x10 Seated thoracic/cervical extension with ball behind back x20 Trigger Point Dry-Needling  Treatment instructions: Expect mild to moderate muscle soreness. S/S of pneumothorax if dry needled over a lung field, and to seek immediate medical  attention should they occur. Patient verbalized understanding of these instructions and education. Patient Consent Given: Yes Education handout provided: Yes Muscles treated: Bilateral cervical multifidi, bilat upper traps, bilat rhomboids, bilat delts Treatment response/outcome: Utilized skilled palpation to identify bony landmarks and trigger points.  Able to illicit twitch response and muscle elongation.  Soft tissue mobilization  following to further promote tissue elongation.   DATE: 09/23/2023 UBE level 1.0 x3 min each direction with PT present to discuss status Neck Disability Index score: 11 / 50 = 22.0 % 4D scapular stabilization on wall x20 each bilat Standing facing wall "W" lower trap lift offs 2x10 Standing shoulder rows with red tband 2x10 Standing shoulder extension with red tband 2x10 Standing shoulder ER with red tband 2x10 Standing shoulder horizontal abduction with red tband 2x10 Seated thoracic/cervical extension with ball behind back x20 Trigger Point Dry-Needling  Treatment instructions: Expect mild to moderate muscle soreness. S/S of pneumothorax if dry needled over a lung field, and to seek immediate medical attention should they occur. Patient verbalized understanding of these instructions and education. Patient Consent Given: Yes Education handout provided: Yes Muscles treated: Bilateral cervical multifidi, bilat upper traps, bilat rhomboids, bilat delts Electrical stimulation performed: No Parameters: N/A Treatment response/outcome: Utilized skilled palpation to identify bony landmarks and trigger points.  Able to illicit twitch response and muscle elongation.  Soft tissue mobilization following to further promote tissue elongation.    PATIENT EDUCATION:  Education details: Initiated HEP Person educated: Patient Education method: Programmer, multimedia, Facilities manager, Verbal cues, and Handouts Education comprehension: verbalized understanding, returned demonstration, and verbal cues required  HOME EXERCISE PROGRAM: Access Code: [email or web address] URL: [email or web address] Date: 09/23/2023 Prepared by: Mikey Kirschner  Exercises - Prone Shoulder Extension - Single Arm  - 2 x daily - 7 x weekly - 2 sets - 10 reps - Prone Shoulder Row  - 2 x daily - 7 x weekly - 2 sets - 10 reps - Prone Single Arm Shoulder Horizontal Abduction with Scapular Retraction and Palm Down  - 2 x daily - 7 x weekly -  2 sets - 10 reps - Sidelying Shoulder External Rotation  - 2 x daily - 7 x weekly - 2 sets - 10 reps - Single Arm Serratus Punches in Supine with Dumbbell  - 2 x daily - 7 x weekly - 2 sets - 10 reps - Standing Shoulder Flexion to 90 Degrees with Dumbbells  - 2 x daily - 7 x weekly - 2 sets - 10 reps - Standing Shoulder Scaption  - 2 x daily - 7 x weekly - 2 sets - 10 reps  ASSESSMENT:  CLINICAL IMPRESSION: Pt reports that he feels looser after DN last session and reports the same level of pain.  Pt requires tactile and verbal cues for technique with scapular theraband exercises. PT educated pt regarding activity modification and balance of tasks to allow for return to juggling. Patient with good response to dry needling with twitch response and improved tissue mobility after treatment. Patient has planned deviated septum surgery approaching in a couple of weeks.  Patient will benefit from skilled PT to address the below impairments and improve overall function.   OBJECTIVE IMPAIRMENTS: decreased ROM, decreased strength, increased fascial restrictions, increased muscle spasms, impaired flexibility, impaired UE functional use, postural dysfunction, and pain.   ACTIVITY LIMITATIONS: carrying, lifting, transfers, and reach over head  PARTICIPATION LIMITATIONS: meal prep, cleaning, laundry, driving, and yard work  PERSONAL FACTORS: Past/current experiences and Profession are also affecting patient's functional outcome.  REHAB POTENTIAL: Good  CLINICAL DECISION MAKING: Stable/uncomplicated  EVALUATION COMPLEXITY: Low   GOALS: Goals reviewed with patient? Yes  SHORT TERM GOALS: Target date: 10/21/2023   Pain report to be no greater than 4/10  Baseline:  Goal status: Ongoing  2.  Patient will be independent with initial HEP  Baseline:  Goal status: Ongoing  LONG TERM GOALS: Target date: 11/18/2023   Patient to report pain no greater than 2/10  Baseline:  Goal status:  INITIAL  2.  Patient to be independent with advanced HEP  Baseline:  Goal status: INITIAL  3.  Patient to be able to sleep through the night  Baseline:  Goal status: INITIAL  4.  Patient to report 85% improvement in overall symptoms  Baseline:  Goal status: INITIAL  5.  Patient to be able to juggle without restriction or limitations Baseline:  Goal status: INITIAL  6.  Patient will be able to reach overhead into cabinets and on top of shelves without pain  Baseline:  Goal status: INITIAL   PLAN:  PT FREQUENCY: 1-2x/week  PT DURATION: 8 weeks  PLANNED INTERVENTIONS: Therapeutic exercises, Therapeutic activity, Neuromuscular re-education, Balance training, Gait training, Patient/Family education, Self Care, Joint mobilization, Vestibular training, Canalith repositioning, Aquatic Therapy, Dry Needling, Electrical stimulation, Spinal mobilization, Cryotherapy, Moist heat, Splintting, Taping, Traction, Ultrasound, Biofeedback, Ionotophoresis 4mg /ml Dexamethasone, Manual therapy, and Re-evaluation  PLAN FOR NEXT SESSION:  assess response to DN, continue strength and flexibility for upper quarter. Add thread the needle and open book to HEP   Lorrene Reid, PT 09/26/23 10:13 AM   [facility] [address] [place], [place] [address] Phone # [phone] Fax [phone]

## 2023-09-28 ENCOUNTER — Ambulatory Visit: Payer: BC Managed Care – PPO | Admitting: Podiatry

## 2023-09-28 DIAGNOSIS — Q828 Other specified congenital malformations of skin: Secondary | ICD-10-CM

## 2023-09-28 NOTE — Progress Notes (Signed)
Subjective:  Patient ID: Jesse Arroyo, male    DOB: 10-Jan-1961,  MRN: [id number]  Chief Complaint  Patient presents with   Callouses    62 y.o. male presents with the above complaint.  Patient presents with right heel benign skin lesion/porokeratotic lesion painful to touch is progressive gotten worse worse with ambulation worse with pressure he has not seen and was prior to seeing me denies any other acute complaints.  Hurts with ambulation shoe pressure he would like for me to debride down.  Denies any other acute issues.   Review of Systems: Negative except as noted in the HPI. Denies N/V/F/Ch.  Past Medical History:  Diagnosis Date   Allergy    Colonic mass    Deviated nasal septum 01/14/14   per pt   Hyperlipidemia    Hypertension    Skin abnormalities    feet    Current Outpatient Medications:    Ascorbic Acid (VITAMIN C PO), Take 1 tablet by mouth daily., Disp: , Rfl:    aspirin 81 MG tablet, Take 81 mg by mouth daily with breakfast. , Disp: , Rfl:    cyclobenzaprine (FLEXERIL) 10 MG tablet, Take 1 tablet (10 mg total) by mouth at bedtime., Disp: 15 tablet, Rfl: 0   dicyclomine (BENTYL) 10 MG capsule, TAKE 1 CAPSULE (10 MG TOTAL) BY MOUTH 4 (FOUR) TIMES DAILY - BEFORE MEALS AND AT BEDTIME., Disp: 360 capsule, Rfl: 1   fluticasone (FLONASE) 50 MCG/ACT nasal spray, SMARTSIG:1-2 Spray(s) Both Nares Daily, Disp: , Rfl:    lisinopril-hydrochlorothiazide (ZESTORETIC) 20-12.5 MG tablet, TAKE 1 TABLET BY MOUTH EVERY DAY, Disp: 90 tablet, Rfl: 1   methylPREDNISolone (MEDROL DOSEPAK) 4 MG TBPK tablet, Take as directed, Disp: 21 tablet, Rfl: 0   Multiple Vitamin (MULTIVITAMIN) tablet, Take 1 tablet by mouth daily with breakfast. , Disp: , Rfl:    rosuvastatin (CRESTOR) 10 MG tablet, Take 1 tablet (10 mg total) by mouth daily., Disp: 90 tablet, Rfl: 3   Zinc 50 MG TABS, Take by mouth., Disp: , Rfl:   Social History   Tobacco Use  Smoking Status Former   Current packs/day: 0.00    Types: Cigarettes   Quit date: 12/27/1993   Years since quitting: 29.7  Smokeless Tobacco Former   Types: Snuff   Quit date: 12/28/2007    No Known Allergies Objective:  There were no vitals filed for this visit. There is no height or weight on file to calculate BMI. Constitutional Well developed. Well nourished.  Vascular Dorsalis pedis pulses palpable bilaterally. Posterior tibial pulses palpable bilaterally. Capillary refill normal to all digits.  No cyanosis or clubbing noted. Pedal hair growth normal.  Neurologic Normal speech. Oriented to person, place, and time. Epicritic sensation to light touch grossly present bilaterally.  Dermatologic Right heel porokeratotic lesion with central nucleated core noted pain on palpation to the lesion.  No pinpoint bleeding noted upon debridement no signs of plantar verruca noted.  Orthopedic: Normal joint ROM without pain or crepitus bilaterally. No visible deformities. No bony tenderness.   Radiographs: None Assessment:   1. Porokeratosis    Plan:  Patient was evaluated and treated and all questions answered.  Right heel porokeratosis -All questions and concerns were discussed with the patient in extensive detail. -Given the amount of pain that he is having he will benefit from debridement of the lesion using chisel blade handle the lesion was debrided down to healthy dry tissue.  No complication noted no pinpoint bleeding noted -Discussed shoe  gear modification  No follow-ups on file.

## 2023-09-29 ENCOUNTER — Ambulatory Visit: Payer: BC Managed Care – PPO | Attending: Adult Health

## 2023-09-29 DIAGNOSIS — M542 Cervicalgia: Secondary | ICD-10-CM | POA: Diagnosis not present

## 2023-09-29 DIAGNOSIS — R293 Abnormal posture: Secondary | ICD-10-CM | POA: Diagnosis not present

## 2023-09-29 DIAGNOSIS — R252 Cramp and spasm: Secondary | ICD-10-CM | POA: Diagnosis not present

## 2023-09-29 DIAGNOSIS — M6281 Muscle weakness (generalized): Secondary | ICD-10-CM | POA: Diagnosis not present

## 2023-09-29 NOTE — Therapy (Signed)
OUTPATIENT PHYSICAL THERAPY TREATMENT NOTE   Patient Name: Jesse Arroyo MRN: [id number] DOB:08-Sep-1961, 62 y.o., male Today's Date: 09/29/2023  END OF SESSION:  PT End of Session - 09/29/23 0832     Visit Number 4    Date for PT Re-Evaluation 11/18/23    Authorization Type BCBS    PT Start Time 0757    PT Stop Time 0833    PT Time Calculation (min) 36 min    Activity Tolerance Patient tolerated treatment well    Behavior During Therapy [facility] for tasks assessed/performed               Past Medical History:  Diagnosis Date   Allergy    Colonic mass    Deviated nasal septum 01/14/14   per pt   Hyperlipidemia    Hypertension    Skin abnormalities    feet   Past Surgical History:  Procedure Laterality Date   COLON RESECTION  2013   COLONOSCOPY     HEMICOLECTOMY  05/10/12   INSERTION OF MESH N/A 01/23/2014   Procedure: OPEN INSERTION OF MESH;  Surgeon: Almond Lint, MD;  Location: [facility];  Service: General;  Laterality: N/A;   VASECTOMY     VENTRAL HERNIA REPAIR N/A 01/23/2014   Procedure: LAPAROSCOPIC VENTRAL HERNIA REPAIR;  Surgeon: Almond Lint, MD;  Location: [facility];  Service: General;  Laterality: N/A;   WISDOM TOOTH EXTRACTION     Patient Active Problem List   Diagnosis Date Noted   Herpes zoster without complication 01/25/2023   Routine general medical examination at a health care facility 02/04/2015   Tear of medial cartilage or meniscus of knee, current 03/12/2014   Ventral hernia 01/23/2014   Incisional hernia 01/02/2013   Dysplastic nevi 07/31/2012   Hematuria, gross 07/26/2012   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 04/14/2012   Plantar fasciitis 01/20/2012   ERECTILE DYSFUNCTION, ORGANIC 04/09/2010   DISC DISEASE, LUMBAR 09/28/2008   Essential hypertension 06/25/2008   Allergic rhinitis 06/25/2008    PCP: Shirline Frees, NP  REFERRING PROVIDER: Shirline Frees, NP  REFERRING DIAG: T14.8XXA (ICD-10-CM) - Muscle  strain  THERAPY DIAG:  Cervicalgia  Muscle weakness (generalized)  Cramp and spasm  Abnormal posture  Rationale for Evaluation and Treatment: Rehabilitation  ONSET DATE: 07/28/23  SUBJECTIVE:                                                                                                                                                                                                         SUBJECTIVE STATEMENT:  I was a little sore after last visit.  Overall feeling a little bit better, 7-8% better.   Hand dominance: Right  PERTINENT HISTORY:  Abdominal mesh due to colon resection.   PAIN:   Are you having pain? Yes: NPRS scale: 4/10 Pain location: bilateral upper traps and neck Pain description: aching and tight Aggravating factors: juggling and riding his bike (distances of up to 22 miles round trips) Relieving factors: Tylenol  PRECAUTIONS: Other: abdominal mesh due to colon resection  RED FLAGS: None     WEIGHT BEARING RESTRICTIONS: No  FALLS:  Has patient fallen in last 6 months? No  LIVING ENVIRONMENT: Lives with: lives with their spouse Lives in: House/apartment  OCCUPATION: juggler  PLOF: Independent, Independent with basic ADLs, Independent with household mobility without device, Independent with community mobility without device, Independent with homemaking with ambulation, Independent with gait, and Independent with transfers  PATIENT GOALS: His goal is to relieve his symptoms so that he is able to continue those activities that he enjoys.  NEXT MD VISIT: prn  OBJECTIVE:   DIAGNOSTIC FINDINGS:  Right shoulder Radiograph on 06/01/2022: IMPRESSION: Moderate right acromioclavicular osteoarthritis.  PATIENT SURVEYS:  09/23/2023:  NDI 11 / 50 = 22.0 %  COGNITION: Overall cognitive status: Within functional limits for tasks assessed  SENSATION: WFL  POSTURE: rounded shoulders and forward head  PALPATION: Significant tension, taut bands and  trigger points bilateral upper traps and levator,  L > R   CERVICAL ROM:   Active ROM A/PROM (deg) eval  Flexion WNL  Extension 75%  Right lateral flexion 75%  Left lateral flexion WNL  Right rotation 75%  Left rotation WNL   (Blank rows = not tested)  UPPER EXTREMITY ROM: WFL - Impingement pain on left shoulder at approx 95-100 degrees flexion and abduction  UPPER EXTREMITY MMT:  Right UE generally 4+ to 5/5,  Left UE generally 4+/5 with exception of shoulder flexion, abduction, scaption with IR and ER and ER at 0 degrees abduction.    CERVICAL SPECIAL TESTS:  Spurling's test: Negative Special tests for shoulder: Positive Neers and empty can  TODAY'S TREATMENT:     DATE: 09/29/2023 UBE level 1.5 x3 min each direction with PT present to discuss status Seated upper trap stretch 2x20 seconds  Ball roll outs 3 ways x 5 second hold  Wall clocks 2x5 Rt and Lt with green loop  Prone shoulder extension, horizontal abduction and rows with 3# Lt and Rt x20 each  Sidelying ER 3# x20 Rt and Lt Open book stretch: x10 Rt and Lt  Standing shoulder ER with red tband 2x10 Seated thoracic/cervical extension with ball behind back x20                                                                                                                          DATE: 09/26/2023 UBE level 1.5 x3 min each direction with PT present to discuss status Seated upper trap stretch 2x20 seconds  [place] Pies  roll outs 3 ways x 5 second hold  4D scapular stabilization on wall x20 each bilat Standing facing wall "W" lower trap lift offs 2x10 Standing shoulder rows with red tband 2x10 Standing shoulder extension with red tband 2x10 Standing shoulder ER with red tband 2x10 Standing shoulder horizontal abduction with red tband 2x10 Seated thoracic/cervical extension with ball behind back x20 Trigger Point Dry-Needling  Treatment instructions: Expect mild to moderate muscle soreness. S/S of pneumothorax if dry needled  over a lung field, and to seek immediate medical attention should they occur. Patient verbalized understanding of these instructions and education. Patient Consent Given: Yes Education handout provided: Yes Muscles treated: Bilateral cervical multifidi, bilat upper traps, bilat rhomboids, bilat delts Treatment response/outcome: Utilized skilled palpation to identify bony landmarks and trigger points.  Able to illicit twitch response and muscle elongation.  Soft tissue mobilization following to further promote tissue elongation.   DATE: 09/23/2023 UBE level 1.0 x3 min each direction with PT present to discuss status Neck Disability Index score: 11 / 50 = 22.0 % 4D scapular stabilization on wall x20 each bilat Standing facing wall "W" lower trap lift offs 2x10 Standing shoulder rows with red tband 2x10 Standing shoulder extension with red tband 2x10 Standing shoulder ER with red tband 2x10 Standing shoulder horizontal abduction with red tband 2x10 Seated thoracic/cervical extension with ball behind back x20 Trigger Point Dry-Needling  Treatment instructions: Expect mild to moderate muscle soreness. S/S of pneumothorax if dry needled over a lung field, and to seek immediate medical attention should they occur. Patient verbalized understanding of these instructions and education. Patient Consent Given: Yes Education handout provided: Yes Muscles treated: Bilateral cervical multifidi, bilat upper traps, bilat rhomboids, bilat delts Electrical stimulation performed: No Parameters: N/A Treatment response/outcome: Utilized skilled palpation to identify bony landmarks and trigger points.  Able to illicit twitch response and muscle elongation.  Soft tissue mobilization following to further promote tissue elongation.    PATIENT EDUCATION:  Education details: Initiated HEP Person educated: Patient Education method: Programmer, multimedia, Facilities manager, Verbal cues, and Handouts Education comprehension:  verbalized understanding, returned demonstration, and verbal cues required  HOME EXERCISE PROGRAM: Access Code: [email or web address] URL: [email or web address] Date: 09/29/2023 Prepared by: Tresa Endo  Exercises - Prone Shoulder Extension - Single Arm  - 2 x daily - 7 x weekly - 2 sets - 10 reps - Prone Shoulder Row  - 2 x daily - 7 x weekly - 2 sets - 10 reps - Prone Single Arm Shoulder Horizontal Abduction with Scapular Retraction and Palm Down  - 2 x daily - 7 x weekly - 2 sets - 10 reps - Sidelying Shoulder External Rotation  - 2 x daily - 7 x weekly - 2 sets - 10 reps - Single Arm Serratus Punches in Supine with Dumbbell  - 2 x daily - 7 x weekly - 2 sets - 10 reps - Standing Shoulder Flexion to 90 Degrees with Dumbbells  - 2 x daily - 7 x weekly - 2 sets - 10 reps - Standing Shoulder Scaption  - 2 x daily - 7 x weekly - 2 sets - 10 reps - Sidelying Open Book Thoracic Lumbar Rotation and Extension  - 2 x daily - 7 x weekly - 1 sets - 10 reps ASSESSMENT:  CLINICAL IMPRESSION: Pt had good response to DN and reports that his neck feels better overall with reduced tension.  He reports 7-8% improvement since the start of care.  Pt has been compliant with his HEP and PT  reviewed prone series for form.  Pt with good demo with only minor and intermittent tactile cues for postural muscle activation. He demonstrates thoracic stiffness with open book stretch and this added to HEP to improve mobility.    Patient will benefit from skilled PT to address the below impairments and improve overall function.   OBJECTIVE IMPAIRMENTS: decreased ROM, decreased strength, increased fascial restrictions, increased muscle spasms, impaired flexibility, impaired UE functional use, postural dysfunction, and pain.   ACTIVITY LIMITATIONS: carrying, lifting, transfers, and reach over head  PARTICIPATION LIMITATIONS: meal prep, cleaning, laundry, driving, and yard work  PERSONAL FACTORS: Past/current experiences and  Profession are also affecting patient's functional outcome.   REHAB POTENTIAL: Good  CLINICAL DECISION MAKING: Stable/uncomplicated  EVALUATION COMPLEXITY: Low   GOALS: Goals reviewed with patient? Yes  SHORT TERM GOALS: Target date: 10/21/2023   Pain report to be no greater than 4/10  Baseline:  5-6/10 (09/29/23) Goal status: Ongoing  2.  Patient will be independent with initial HEP  Baseline: 09/29/23 Goal status: MET  LONG TERM GOALS: Target date: 11/18/2023   Patient to report pain no greater than 2/10  Baseline:  Goal status: INITIAL  2.  Patient to be independent with advanced HEP  Baseline:  Goal status: INITIAL  3.  Patient to be able to sleep through the night  Baseline: sleeping better, not rated (09/29/23) Goal status: INITIAL  4.  Patient to report 85% improvement in overall symptoms  Baseline:  Goal status: INITIAL  5.  Patient to be able to juggle without restriction or limitations Baseline:  Goal status: INITIAL  6.  Patient will be able to reach overhead into cabinets and on top of shelves without pain  Baseline:  Goal status: INITIAL   PLAN:  PT FREQUENCY: 1-2x/week  PT DURATION: 8 weeks  PLANNED INTERVENTIONS: Therapeutic exercises, Therapeutic activity, Neuromuscular re-education, Balance training, Gait training, Patient/Family education, Self Care, Joint mobilization, Vestibular training, Canalith repositioning, Aquatic Therapy, Dry Needling, Electrical stimulation, Spinal mobilization, Cryotherapy, Moist heat, Splintting, Taping, Traction, Ultrasound, Biofeedback, Ionotophoresis 4mg /ml Dexamethasone, Manual therapy, and Re-evaluation  PLAN FOR NEXT SESSION:  assess response to DN, continue strength and flexibility for upper quarter. 1 more session prior to nasal surgery.  PT encouraged pt to schedule appts for after surgery.    Lorrene Reid, PT 09/29/23 8:33 AM   [facility] [address] [place], [place] [address] Phone # [phone] Fax [phone]

## 2023-10-04 DIAGNOSIS — J3489 Other specified disorders of nose and nasal sinuses: Secondary | ICD-10-CM | POA: Diagnosis not present

## 2023-10-04 DIAGNOSIS — J342 Deviated nasal septum: Secondary | ICD-10-CM | POA: Diagnosis not present

## 2023-10-04 DIAGNOSIS — J343 Hypertrophy of nasal turbinates: Secondary | ICD-10-CM | POA: Diagnosis not present

## 2023-10-07 ENCOUNTER — Encounter (INDEPENDENT_AMBULATORY_CARE_PROVIDER_SITE_OTHER): Payer: Self-pay

## 2023-10-07 ENCOUNTER — Ambulatory Visit (INDEPENDENT_AMBULATORY_CARE_PROVIDER_SITE_OTHER): Payer: BC Managed Care – PPO | Admitting: Otolaryngology

## 2023-10-07 VITALS — Ht 72.0 in | Wt 184.0 lb

## 2023-10-07 DIAGNOSIS — J310 Chronic rhinitis: Secondary | ICD-10-CM

## 2023-10-07 DIAGNOSIS — Z9889 Other specified postprocedural states: Secondary | ICD-10-CM

## 2023-10-09 DIAGNOSIS — J310 Chronic rhinitis: Secondary | ICD-10-CM | POA: Insufficient documentation

## 2023-10-09 NOTE — Progress Notes (Signed)
Patient ID: Jesse Arroyo, male   DOB: [birth year], 62 y.o.   MRN: [id number]  Ralph Leyden splints removed.   Septum and turbinates are healing well.   Both [place] debrided.  Nasal saline irrigation.  Recheck in 3 weeks.

## 2023-10-13 ENCOUNTER — Ambulatory Visit: Payer: BC Managed Care – PPO

## 2023-10-13 DIAGNOSIS — M6281 Muscle weakness (generalized): Secondary | ICD-10-CM

## 2023-10-13 DIAGNOSIS — R252 Cramp and spasm: Secondary | ICD-10-CM

## 2023-10-13 DIAGNOSIS — R293 Abnormal posture: Secondary | ICD-10-CM | POA: Diagnosis not present

## 2023-10-13 DIAGNOSIS — M542 Cervicalgia: Secondary | ICD-10-CM | POA: Diagnosis not present

## 2023-10-13 NOTE — Therapy (Signed)
OUTPATIENT PHYSICAL THERAPY TREATMENT NOTE   Patient Name: Jesse Arroyo MRN: [id number] DOB:March 06, 1961, 62 y.o., male Today's Date: 10/13/2023  END OF SESSION:  PT End of Session - 10/13/23 0841     Visit Number 5    Date for PT Re-Evaluation 11/18/23    Authorization Type BCBS    Authorization - Visit Number 5    Authorization - Number of Visits 30    PT Start Time 0756    PT Stop Time 0840    PT Time Calculation (min) 44 min    Activity Tolerance Patient tolerated treatment well    Behavior During Therapy [facility] for tasks assessed/performed                Past Medical History:  Diagnosis Date   Allergy    Colonic mass    Deviated nasal septum 01/14/14   per pt   Hyperlipidemia    Hypertension    Skin abnormalities    feet   Past Surgical History:  Procedure Laterality Date   COLON RESECTION  2013   COLONOSCOPY     HEMICOLECTOMY  05/10/12   INSERTION OF MESH N/A 01/23/2014   Procedure: OPEN INSERTION OF MESH;  Surgeon: Almond Lint, MD;  Location: [facility];  Service: General;  Laterality: N/A;   VASECTOMY     VENTRAL HERNIA REPAIR N/A 01/23/2014   Procedure: LAPAROSCOPIC VENTRAL HERNIA REPAIR;  Surgeon: Almond Lint, MD;  Location: [facility];  Service: General;  Laterality: N/A;   WISDOM TOOTH EXTRACTION     Patient Active Problem List   Diagnosis Date Noted   Chronic rhinitis 10/09/2023   Herpes zoster without complication 01/25/2023   Routine general medical examination at a health care facility 02/04/2015   Tear of medial cartilage or meniscus of knee, current 03/12/2014   Ventral hernia 01/23/2014   Incisional hernia 01/02/2013   Dysplastic nevi 07/31/2012   Hematuria, gross 07/26/2012   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  My neck is feeling better, I don't have the tingling as much.     Hand dominance: Right  PERTINENT HISTORY:  Abdominal mesh due to colon resection.   PAIN:  10/13/23: Are you having pain? Yes: NPRS scale: 3-4/10 Pain location: bilateral upper traps and neck Pain description: aching and tight, sore Aggravating factors: juggling and riding his bike (distances of up to 22 miles round trips) Relieving factors: Tylenol  PRECAUTIONS: Other: abdominal mesh due to colon resection  RED FLAGS: None     WEIGHT BEARING RESTRICTIONS: No  FALLS:  Has patient fallen in last 6 months? No  LIVING ENVIRONMENT: Lives with: lives with their spouse Lives in: House/apartment  OCCUPATION: juggler  PLOF: Independent, Independent with basic ADLs, Independent with household mobility without device, Independent with community mobility without device, Independent with homemaking with ambulation, Independent with gait, and Independent with transfers  PATIENT GOALS: His goal is to relieve his symptoms so that he is able to continue those activities that he enjoys.  NEXT MD VISIT: prn  OBJECTIVE:   DIAGNOSTIC FINDINGS:  Right shoulder Radiograph on 06/01/2022: IMPRESSION: Moderate right acromioclavicular osteoarthritis.  PATIENT SURVEYS:  09/23/2023:  NDI 11 / 50 = 22.0 %  COGNITION: Overall cognitive status: Within functional limits for  tasks assessed  SENSATION: WFL  POSTURE: rounded shoulders and forward head  PALPATION: Significant tension, taut bands and trigger points bilateral upper traps and levator,  L > R   CERVICAL ROM:   Active ROM A/PROM (deg) eval A/ROM 10/13/23  Flexion WNL 60  Extension 75%   Right lateral flexion 75% 35  Left lateral flexion WNL 35  Right rotation 75% 50  Left rotation WNL 50   (Blank rows = not tested)  UPPER EXTREMITY ROM: WFL - Impingement pain on left shoulder at approx 95-100 degrees flexion and abduction  UPPER EXTREMITY MMT:  Right UE generally 4+ to 5/5,  Left UE generally 4+/5 with exception of shoulder flexion, abduction, scaption with IR and ER and ER at 0 degrees abduction.    CERVICAL SPECIAL TESTS:  Spurling's test: Negative Special tests for shoulder: Positive Neers and empty can  TODAY'S TREATMENT:     DATE: 10/13/2023 UBE level 2 x3 min each direction with PT present to discuss status Seated cervical A/ROM 3 way and measures taken after- see above Ball roll outs 3 ways x 5 second hold  Wall clocks 2x5 Rt and Lt with green loop  Seated red band: horizontal abduction, ER and D2 2x10 each  Sidelying ER 3# x20 Rt and Lt Open book stretch: x10 Rt and Lt  Manual: cervical mobs with movement to neck in all directions , manual traction and passive upper trap stretch                   DATE: 09/29/2023 UBE level 1.5 x3 min each direction with PT present to discuss status Seated upper trap stretch 2x20 seconds  Ball roll outs 3 ways x 5 second hold  Wall clocks 2x5 Rt and Lt with green loop  Prone shoulder extension, horizontal abduction and rows with 3# Lt and Rt x20 each  Sidelying ER 3# x20 Rt and Lt Open book stretch: x10 Rt and Lt  Standing shoulder ER with red tband 2x10 Seated thoracic/cervical extension with ball behind back x20  DATE: 09/26/2023 UBE level 1.5 x3 min each direction with PT present to discuss status Seated upper trap stretch 2x20 seconds  Ball roll outs 3 ways x 5 second hold  4D scapular stabilization on wall x20 each bilat Standing facing wall "W" lower trap lift offs 2x10 Standing shoulder rows with red tband 2x10 Standing shoulder extension with red tband 2x10 Standing shoulder ER with red tband 2x10 Standing shoulder horizontal abduction with red tband 2x10 Seated thoracic/cervical extension with ball behind back x20 Trigger Point Dry-Needling  Treatment instructions: Expect mild to moderate muscle soreness. S/S of pneumothorax if dry needled over a lung field, and to seek immediate medical attention should they occur. Patient verbalized understanding of these instructions and education. Patient Consent Given: Yes Education handout provided: Yes Muscles treated: Bilateral cervical multifidi, bilat upper traps, bilat rhomboids, bilat delts Treatment response/outcome: Utilized skilled palpation to identify bony landmarks and trigger points.  Able to illicit twitch response and muscle elongation.  Soft tissue mobilization following to further promote tissue elongation.    PATIENT EDUCATION:  Education details: Initiated HEP Person educated: Patient Education method: Programmer, multimedia, Facilities manager, Verbal cues, and Handouts Education comprehension: verbalized understanding, returned demonstration, and verbal cues required  HOME EXERCISE PROGRAM: Access Code: [email or web address] URL: [email or web address] Date: 09/29/2023 Prepared by: Tresa Endo  Exercises - Prone Shoulder Extension - Single Arm  - 2 x daily - 7 x weekly - 2 sets - 10 reps - Prone Shoulder Row  - 2 x daily - 7 x weekly - 2 sets - 10 reps - Prone Single Arm Shoulder Horizontal Abduction with Scapular Retraction and Palm Down  - 2 x daily - 7 x weekly - 2 sets - 10 reps - Sidelying Shoulder External Rotation  - 2 x daily - 7 x  weekly - 2 sets - 10 reps - Single Arm Serratus Punches in Supine with Dumbbell  - 2 x daily - 7 x weekly - 2 sets - 10 reps - Standing Shoulder Flexion to 90 Degrees with Dumbbells  - 2 x daily - 7 x weekly - 2 sets - 10 reps - Standing Shoulder Scaption  - 2 x daily - 7 x weekly - 2 sets - 10 reps - Sidelying Open Book Thoracic Lumbar Rotation and Extension  - 2 x daily - 7 x weekly - 1 sets - 10 reps ASSESSMENT:  CLINICAL IMPRESSION: Pt had nasal surgery 2 weeks ago so lapse in treatment.  Pt reports that he is no longer waking with pain at night with pain.  Pt did well with seated band exercises without pain, PT provided tactile and verbal cueing for scapular depression.  Not able to perform D2 with Lt UE in sitting due to pain and weakness.   Cervical A/ROM is limited in all directions although is now equal in Rt and Lt directions. Patient will benefit from skilled PT to address the below impairments and improve overall function.   OBJECTIVE IMPAIRMENTS: decreased ROM, decreased strength, increased fascial restrictions, increased muscle spasms, impaired flexibility, impaired UE functional use, postural dysfunction, and pain.   ACTIVITY LIMITATIONS: carrying, lifting, transfers, and reach over head  PARTICIPATION LIMITATIONS: meal prep, cleaning, laundry, driving, and yard work  PERSONAL FACTORS: Past/current experiences and Profession are also affecting patient's functional outcome.   REHAB POTENTIAL: Good  CLINICAL DECISION MAKING: Stable/uncomplicated  EVALUATION COMPLEXITY: Low   GOALS: Goals reviewed with patient? Yes  SHORT TERM GOALS: Target date: 10/21/2023   Pain report to be no greater than 4/10  Baseline:  5-6/10 (09/29/23) Goal status: Ongoing  2.  Patient will be independent with initial HEP  Baseline: 09/29/23 Goal status: MET  LONG TERM GOALS: Target date: 11/18/2023   Patient to report pain no greater than 2/10  Baseline: 3-5/10 (10/13/23) Goal status: in  progress   2.  Patient to be independent with advanced HEP  Baseline:  Goal status: INITIAL  3.  Patient to be able to sleep through the night  Baseline: not waking with pain (10/13/23) Goal status: MET  4.  Patient to report 85% improvement in overall symptoms  Baseline:  Goal status: INITIAL  5.  Patient to be able to juggle without restriction or limitations Baseline:  Goal status: INITIAL  6.  Patient will be able to reach overhead into cabinets and on top of shelves without pain  Baseline:  Goal status: INITIAL   PLAN:  PT FREQUENCY: 1-2x/week  PT DURATION: 8 weeks  PLANNED INTERVENTIONS: Therapeutic exercises, Therapeutic activity, Neuromuscular re-education, Balance training, Gait training, Patient/Family education, Self Care, Joint mobilization, Vestibular training, Canalith repositioning, Aquatic Therapy, Dry Needling, Electrical stimulation, Spinal mobilization, Cryotherapy, Moist heat, Splintting, Taping, Traction, Ultrasound, Biofeedback, Ionotophoresis 4mg /ml Dexamethasone, Manual therapy, and Re-evaluation  PLAN FOR NEXT SESSION:  neck and shoulder strength, manual to address cervical stiffness    Lorrene Reid, PT 10/13/23 8:52 AM   [facility] [address], [address] [place], [place] [address] Phone # [phone] Fax [phone]

## 2023-10-17 ENCOUNTER — Ambulatory Visit: Payer: BC Managed Care – PPO

## 2023-10-17 DIAGNOSIS — M6281 Muscle weakness (generalized): Secondary | ICD-10-CM | POA: Diagnosis not present

## 2023-10-17 DIAGNOSIS — R252 Cramp and spasm: Secondary | ICD-10-CM

## 2023-10-17 DIAGNOSIS — R293 Abnormal posture: Secondary | ICD-10-CM | POA: Diagnosis not present

## 2023-10-17 DIAGNOSIS — M542 Cervicalgia: Secondary | ICD-10-CM

## 2023-10-17 NOTE — Therapy (Addendum)
OUTPATIENT PHYSICAL THERAPY TREATMENT NOTE   Patient Name: Jesse Arroyo MRN: [id number] DOB:12/05/1961, 62 y.o., male Today's Date: 10/17/2023  END OF SESSION:  PT End of Session - 10/17/23 1447     Visit Number 6    Date for PT Re-Evaluation 11/18/23    Authorization Type [employer]    Authorization - Number of Visits 30    PT Start Time 1402    PT Stop Time 1446    PT Time Calculation (min) 44 min    Activity Tolerance Patient tolerated treatment well    Behavior During Therapy [facility] for tasks assessed/performed                 Past Medical History:  Diagnosis Date   Allergy    Colonic mass    Deviated nasal septum 01/14/14   per pt   Hyperlipidemia    Hypertension    Skin abnormalities    feet   Past Surgical History:  Procedure Laterality Date   COLON RESECTION  2013   COLONOSCOPY     HEMICOLECTOMY  05/10/12   INSERTION OF MESH N/A 01/23/2014   Procedure: OPEN INSERTION OF MESH;  Surgeon: Almond Lint, MD;  Location: [facility];  Service: General;  Laterality: N/A;   VASECTOMY     VENTRAL HERNIA REPAIR N/A 01/23/2014   Procedure: LAPAROSCOPIC VENTRAL HERNIA REPAIR;  Surgeon: Almond Lint, MD;  Location: [facility];  Service: General;  Laterality: N/A;   WISDOM TOOTH EXTRACTION     Patient Active Problem List   Diagnosis Date Noted   Chronic rhinitis 10/09/2023   Herpes zoster without complication 01/25/2023   Routine general medical examination at a health care facility 02/04/2015   Tear of medial cartilage or meniscus of knee, current 03/12/2014   Ventral hernia 01/23/2014   Incisional hernia 01/02/2013   Dysplastic nevi 07/31/2012   Hematuria, gross 07/26/2012   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  70% overall reduction in these symptoms.       Hand dominance: Right  PERTINENT HISTORY:  Abdominal mesh due to colon resection.   PAIN:  10/17/23: Are you having pain? Yes: NPRS scale: 3/10 Pain location: bilateral upper traps and neck Pain description: aching and tight, sore Aggravating factors: juggling and riding his bike (distances of up to 22 miles round trips) Relieving factors: Tylenol  PRECAUTIONS: Other: abdominal mesh due to colon resection  RED FLAGS: None     WEIGHT BEARING RESTRICTIONS: No  FALLS:  Has patient fallen in last 6 months? No  LIVING ENVIRONMENT: Lives with: lives with their spouse Lives in: House/apartment  OCCUPATION: juggler  PLOF: Independent, Independent with basic ADLs, Independent with household mobility without device, Independent with community mobility without device, Independent with homemaking with ambulation, Independent with gait, and Independent with transfers  PATIENT GOALS: His goal is to relieve his symptoms so that he is able to continue those activities that he enjoys.  NEXT MD VISIT: prn  OBJECTIVE:   DIAGNOSTIC FINDINGS:  Right shoulder Radiograph on 06/01/2022: IMPRESSION: Moderate right acromioclavicular osteoarthritis.  PATIENT SURVEYS:  09/23/2023:  NDI 11 / 50 = 22.0 %  COGNITION: Overall cognitive status: Within functional limits for tasks assessed  SENSATION: WFL  POSTURE: rounded  shoulders and forward head  PALPATION: Significant tension, taut bands and trigger points bilateral upper traps and levator,  L > R   CERVICAL ROM:   Active ROM A/PROM (deg) eval A/ROM 10/13/23  Flexion WNL 60  Extension 75%   Right lateral flexion 75% 35  Left lateral flexion WNL 35  Right rotation 75% 50  Left rotation WNL 50   (Blank rows = not tested)  UPPER EXTREMITY ROM: WFL - Impingement pain on left shoulder at approx 95-100 degrees flexion and abduction  UPPER EXTREMITY MMT:  Right UE generally 4+ to 5/5,  Left UE generally 4+/5 with exception of shoulder flexion, abduction, scaption with IR and ER and ER at 0 degrees abduction.    CERVICAL SPECIAL TESTS:  Spurling's test: Negative Special tests for shoulder: Positive Neers and empty can  TODAY'S TREATMENT:     DATE: 10/17/2023 UBE level 2 x3 min each direction with PT present to discuss status Shoulder flexion: 2# added x10, horizontal abduction 2#  Ball roll outs 3 ways x 5 second hold  Wall clocks 2x5 Rt and Lt with green loop  Seated red band: horizontal abduction, ER  2x10 each  Sidelying ER 3# x20 Rt and Lt Open book stretch: x10 Rt and Lt  Traction: Cervical 16#/5# 60 seconds/10 seconds x12 minutes     DATE: 10/13/2023 UBE level 2 x3 min each direction with PT present to discuss status Seated cervical A/ROM 3 way and measures taken after- see above Ball roll outs 3 ways x 5 second hold  Wall clocks 2x5 Rt and Lt with green loop  Seated red band: horizontal abduction, ER and D2 2x10 each  Sidelying ER 3# x20 Rt and Lt Open book stretch: x10 Rt and Lt  Manual: cervical mobs with movement to neck in all directions , manual traction and passive upper trap stretch                   DATE: 09/29/2023 UBE level 1.5 x3 min each direction with PT present to discuss status Seated upper trap stretch 2x20 seconds  Ball roll outs 3 ways x 5  second hold  Wall clocks 2x5 Rt and Lt with green loop  Prone shoulder  extension, horizontal abduction and rows with 3# Lt and Rt x20 each  Sidelying ER 3# x20 Rt and Lt Open book stretch: x10 Rt and Lt  Standing shoulder ER with red tband 2x10 Seated thoracic/cervical extension with ball behind back x20                                                                                                                         PATIENT EDUCATION:  Education details: Initiated HEP Person educated: Patient Education method: Programmer, multimedia, Facilities manager, Verbal cues, and Handouts Education comprehension: verbalized understanding, returned demonstration, and verbal cues required  HOME EXERCISE PROGRAM: Access Code: [email or web address] URL: [email or web address] Date: 09/29/2023 Prepared by: Tresa Endo  Exercises - Prone Shoulder Extension - Single Arm  - 2 x daily - 7 x weekly - 2 sets - 10 reps - Prone Shoulder Row  - 2 x daily - 7 x weekly - 2 sets - 10 reps - Prone Single Arm Shoulder Horizontal Abduction with Scapular Retraction and Palm Down  - 2 x daily - 7 x weekly - 2 sets - 10 reps - Sidelying Shoulder External Rotation  - 2 x daily - 7 x weekly - 2 sets - 10 reps - Single Arm Serratus Punches in Supine with Dumbbell  - 2 x daily - 7 x weekly - 2 sets - 10 reps - Standing Shoulder Flexion to 90 Degrees with Dumbbells  - 2 x daily - 7 x weekly - 2 sets - 10 reps - Standing Shoulder Scaption  - 2 x daily - 7 x weekly - 2 sets - 10 reps - Sidelying Open Book Thoracic Lumbar Rotation and Extension  - 2 x daily - 7 x weekly - 1 sets - 10 reps ASSESSMENT:  CLINICAL IMPRESSION: Pt reports 70% reduction in Lt UE radiculopathy since the start of care and he is no longer waking with pain at night with pain.  Pt did well with seated band exercises without pain today and PT provided tactile and verbal cueing for scapular depression and pt reports that he is making these corrections at home. Pt did well with trial of traction today to address Lt UE radiculopathy.  Patient  will benefit from skilled PT to address the below impairments and improve overall function.   OBJECTIVE IMPAIRMENTS: decreased ROM, decreased strength, increased fascial restrictions, increased muscle spasms, impaired flexibility, impaired UE functional use, postural dysfunction, and pain.   ACTIVITY LIMITATIONS: carrying, lifting, transfers, and reach over head  PARTICIPATION LIMITATIONS: meal prep, cleaning, laundry, driving, and yard work  PERSONAL FACTORS: Past/current experiences and Profession are also affecting patient's functional outcome.   REHAB POTENTIAL: Good  CLINICAL DECISION MAKING: Stable/uncomplicated  EVALUATION COMPLEXITY: Low   GOALS: Goals reviewed with patient? Yes  SHORT TERM GOALS: Target date: 10/21/2023   Pain report to be no greater than 4/10  Baseline:  5-6/10 (09/29/23) Goal status: Ongoing  2.  Patient will be independent with initial HEP  Baseline: 09/29/23 Goal status: MET  LONG TERM GOALS: Target date: 11/18/2023   Patient to report pain no greater than 2/10  Baseline: 3-5/10 (10/13/23) Goal status: in progress   2.  Patient to be independent with advanced HEP  Baseline:  Goal status: INITIAL  3.  Patient to be able to sleep through the night  Baseline: not waking with pain (10/13/23) Goal status: MET  4.  Patient to report 85% improvement in overall symptoms  Baseline: 70% (10/17/23) Goal status: in progress   5.  Patient to be able to juggle without restriction or limitations Baseline:  Goal status: INITIAL  6.  Patient will be able to reach overhead into cabinets and on top of shelves without pain  Baseline: pain with reaching with Lt (10/17/23) Goal status: in progress    PLAN:  PT FREQUENCY: 1-2x/week  PT DURATION: 8 weeks  PLANNED INTERVENTIONS: Therapeutic exercises, Therapeutic activity, Neuromuscular re-education, Balance training, Gait training, Patient/Family education, Self Care, Joint mobilization, Vestibular  training, Canalith repositioning, Aquatic Therapy, Dry Needling, Electrical stimulation, Spinal mobilization, Cryotherapy, Moist heat, Splintting, Taping, Traction, Ultrasound, Biofeedback, Ionotophoresis 4mg /ml Dexamethasone, Manual therapy, and Re-evaluation  PLAN FOR NEXT SESSION:  neck and shoulder strength, manual to address cervical, traction if tolerated well.  Pt not sure when he will return due to schedule.  Place on hold.    Lorrene Reid, PT 10/17/23 2:48 PM   [facility] [address], [address] [place], [place] [address] Phone # [phone] Fax [phone] PHYSICAL THERAPY DISCHARGE SUMMARY  Visits from Start of Care: 6  Current functional level related to goals / functional outcomes: See above for current PT status.  Pt didn't return to PT.    Remaining deficits: See above    Education / Equipment: HEP   Patient agrees to discharge. Patient goals were partially met. Patient is being discharged due to being pleased with the current functional level.  Lorrene Reid, PT 12/01/23 10:16 AM

## 2023-10-27 DIAGNOSIS — L814 Other melanin hyperpigmentation: Secondary | ICD-10-CM | POA: Diagnosis not present

## 2023-10-27 DIAGNOSIS — L738 Other specified follicular disorders: Secondary | ICD-10-CM | POA: Diagnosis not present

## 2023-10-27 DIAGNOSIS — D2272 Melanocytic nevi of left lower limb, including hip: Secondary | ICD-10-CM | POA: Diagnosis not present

## 2023-10-27 DIAGNOSIS — D225 Melanocytic nevi of trunk: Secondary | ICD-10-CM | POA: Diagnosis not present

## 2023-10-28 ENCOUNTER — Ambulatory Visit (INDEPENDENT_AMBULATORY_CARE_PROVIDER_SITE_OTHER): Payer: BC Managed Care – PPO | Admitting: Otolaryngology

## 2023-10-28 ENCOUNTER — Encounter (INDEPENDENT_AMBULATORY_CARE_PROVIDER_SITE_OTHER): Payer: Self-pay

## 2023-10-28 VITALS — Ht 72.0 in | Wt 184.0 lb

## 2023-10-28 DIAGNOSIS — J310 Chronic rhinitis: Secondary | ICD-10-CM

## 2023-10-28 NOTE — Progress Notes (Signed)
Patient ID: Jesse Arroyo, male   DOB: 12-16-1961, 62 y.o.   MRN: [id number]  Septum and turbinates are healing well.   Both [place] debrided.   Nasal saline irrigation as needed.   Recheck in 6 months.

## 2023-12-16 ENCOUNTER — Other Ambulatory Visit: Payer: Self-pay | Admitting: Adult Health

## 2024-04-27 ENCOUNTER — Ambulatory Visit (INDEPENDENT_AMBULATORY_CARE_PROVIDER_SITE_OTHER): Payer: BC Managed Care – PPO

## 2024-06-03 ENCOUNTER — Other Ambulatory Visit: Payer: Self-pay | Admitting: Adult Health

## 2024-07-05 ENCOUNTER — Other Ambulatory Visit: Payer: Self-pay | Admitting: Adult Health

## 2024-07-06 NOTE — Telephone Encounter (Signed)
 Patient need to schedule a CPE for more refills. Rx filled for 30 days.

## 2024-07-29 ENCOUNTER — Other Ambulatory Visit: Payer: Self-pay | Admitting: Adult Health

## 2024-09-14 ENCOUNTER — Other Ambulatory Visit: Payer: Self-pay | Admitting: Adult Health

## 2024-09-14 NOTE — Telephone Encounter (Signed)
 Patient need to schedule CPE for more refills.

## 2024-09-26 ENCOUNTER — Encounter: Payer: Self-pay | Admitting: Adult Health

## 2024-09-26 ENCOUNTER — Ambulatory Visit: Admitting: Adult Health

## 2024-09-26 VITALS — BP 120/70 | HR 68 | Temp 98.2°F | Ht 72.0 in | Wt 192.0 lb

## 2024-09-26 DIAGNOSIS — M25512 Pain in left shoulder: Secondary | ICD-10-CM

## 2024-09-26 DIAGNOSIS — M25511 Pain in right shoulder: Secondary | ICD-10-CM

## 2024-09-26 DIAGNOSIS — G8929 Other chronic pain: Secondary | ICD-10-CM

## 2024-09-26 MED ORDER — CYCLOBENZAPRINE HCL 10 MG PO TABS: 10.0000 mg | ORAL_TABLET | Freq: Three times a day (TID) | ORAL | 0 refills | Status: DC | PRN

## 2024-09-26 NOTE — Progress Notes (Signed)
 Subjective:    Patient ID: Jesse Arroyo, male    DOB: 09/21/1961, 63 y.o.   MRN: [id number]  HPI  63 year old male who  has a past medical history of Allergy, Colonic mass, Deviated nasal septum (01/14/14), Hyperlipidemia, Hypertension, and Skin abnormalities.  He is being evaluated today for chronic bilateral shoulder pain.  He has had right sided shoulder pain for numerous years, last x-ray was done in June 2023 showed moderate right AC osteoarthritis.  His right shoulder continues to hurt but over the last year and a half or so he has had left-sided shoulder pain that has become progressively worse and now hurts more than the right shoulder.  In the right shoulder he has no loss of range of motion but has pain with doing activity.  In the left shoulder he has difficulty raising his arm above 90 degree degrees and has significant pain with reaching above his head.  He also is unable to easily reach behind his back.  His pain is pretty constant.  He has tried Aleve and Motrin with some varying results.  He went to physical therapy and reports that dry needling helped to some degree.  He has also been to chiropractor and done massage therapy without any improvement.  He denies any trauma or aggravating injury.   Review of Systems See HPI   Past Medical History:  Diagnosis Date   Allergy    Colonic mass    Deviated nasal septum 01/14/14   per pt   Hyperlipidemia    Hypertension    Skin abnormalities    feet    Social History   Socioeconomic History   Marital status: Married    Spouse name: Not on file   Number of children: 2   Years of education: Not on file   Highest education level: Not on file  Occupational History   Occupation: Chief Technology Officer: [employer]  Tobacco Use   Smoking status: Former    Current packs/day: 0.00    Types: Cigarettes    Quit date: 12/27/1993    Years since quitting: 30.7   Smokeless tobacco: Former    Types:  Snuff    Quit date: 12/28/2007  Vaping Use   Vaping status: Never Used  Substance and Sexual Activity   Alcohol use: Yes    Alcohol/week: 4.0 standard drinks of alcohol    Types: 4 Glasses of wine per week   Drug use: No   Sexual activity: Not on file  Other Topics Concern   Not on file  Social History Narrative   Married   Two daughters - One lives at home.       Social Drivers of Corporate investment banker Strain: Not on file  Food Insecurity: Not on file  Transportation Needs: Not on file  Physical Activity: Not on file  Stress: Not on file  Social Connections: Not on file  Intimate Partner Violence: Not on file    Past Surgical History:  Procedure Laterality Date   COLON RESECTION  2013   COLONOSCOPY     HEMICOLECTOMY  05/10/12   INSERTION OF MESH N/A 01/23/2014   Procedure: OPEN INSERTION OF MESH;  Surgeon: Jina Nephew, MD;  Location: [facility];  Service: General;  Laterality: N/A;   VASECTOMY     VENTRAL HERNIA REPAIR N/A 01/23/2014   Procedure: LAPAROSCOPIC VENTRAL HERNIA REPAIR;  Surgeon: Jina Nephew, MD;  Location: [facility];  Service:  General;  Laterality: N/A;   WISDOM TOOTH EXTRACTION      Family History  Problem Relation Age of Onset   Lung cancer Father 73   Hypertension Mother    Hypertension Other    Colon cancer Neg Hx    Rectal cancer Neg Hx    Stomach cancer Neg Hx    Pancreatic cancer Neg Hx    Colon polyps Neg Hx    Esophageal cancer Neg Hx     No Known Allergies  Current Outpatient Medications on File Prior to Visit  Medication Sig Dispense Refill   Ascorbic Acid (VITAMIN C PO) Take 1 tablet by mouth daily.     aspirin 81 MG tablet Take 81 mg by mouth daily with breakfast.      cyclobenzaprine  (FLEXERIL ) 10 MG tablet Take 1 tablet (10 mg total) by mouth at bedtime. 15 tablet 0   dicyclomine  (BENTYL ) 10 MG capsule TAKE 1 CAPSULE (10 MG TOTAL) BY MOUTH 4 (FOUR) TIMES DAILY - BEFORE MEALS AND AT BEDTIME. 360 capsule 1   fluticasone (FLONASE) 50  MCG/ACT nasal spray SMARTSIG:1-2 Spray(s) Both Nares Daily     lisinopril -hydrochlorothiazide  (ZESTORETIC ) 20-12.5 MG tablet TAKE 1 TABLET BY MOUTH EVERY DAY 30 tablet 0   methylPREDNISolone  (MEDROL  DOSEPAK) 4 MG TBPK tablet Take as directed 21 tablet 0   Multiple Vitamin (MULTIVITAMIN) tablet Take 1 tablet by mouth daily with breakfast.      rosuvastatin  (CRESTOR ) 10 MG tablet Take 1 tablet (10 mg total) by mouth daily. No further refills until see for CPE 90 tablet 0   Zinc 50 MG TABS Take by mouth.     No current facility-administered medications on file prior to visit.    BP 120/70   Pulse 68   Temp 98.2 F (36.8 C) (Oral)   Ht 6' (1.829 m)   Wt 192 lb (87.1 kg)   SpO2 97%   BMI 26.04 kg/m       Objective:   Physical Exam Vitals and nursing note reviewed.  Constitutional:      Appearance: Normal appearance.  Cardiovascular:     Rate and Rhythm: Normal rate and regular rhythm.     Pulses: Normal pulses.     Heart sounds: Normal heart sounds.  Musculoskeletal:     Right shoulder: No bony tenderness or crepitus. Normal range of motion. Normal strength.     Left shoulder: Tenderness and bony tenderness present. No crepitus. Decreased range of motion. Normal strength.       Arms:     Comments: Unable to bring arm above 90 degrees Unable to easily perform back scratch test   Pain with performing empty can test   Skin:    General: Skin is warm and dry.  Neurological:     General: No focal deficit present.     Mental Status: He is alert and oriented to person, place, and time.  Psychiatric:        Mood and Affect: Mood normal.        Behavior: Behavior normal.        Thought Content: Thought content normal.        Judgment: Judgment normal.       Assessment & Plan:   1. Chronic right shoulder pain (Primary) - Likely arthritis but will update xray  - DG Shoulder Right; Future - AMB referral to orthopedics - cyclobenzaprine  (FLEXERIL ) 10 MG tablet; Take 1 tablet  (10 mg total) by mouth 3 (three) times daily as  needed for muscle spasms.  Dispense: 30 tablet; Refill: 0  2. Chronic left shoulder pain - There is concern for rotator cuff injury - Will order xray but will need advanced imaging.  - Sen din Flexeril  for tenderness in left upper shoulder.  - DG Shoulder Left; Future - cyclobenzaprine  (FLEXERIL ) 10 MG tablet; Take 1 tablet (10 mg total) by mouth 3 (three) times daily as needed for muscle spasms.  Dispense: 30 tablet; Refill: 0   Darleene Shape, NP

## 2024-09-28 ENCOUNTER — Ambulatory Visit (INDEPENDENT_AMBULATORY_CARE_PROVIDER_SITE_OTHER)

## 2024-09-28 ENCOUNTER — Other Ambulatory Visit

## 2024-09-28 DIAGNOSIS — M19012 Primary osteoarthritis, left shoulder: Secondary | ICD-10-CM | POA: Diagnosis not present

## 2024-09-28 DIAGNOSIS — M25511 Pain in right shoulder: Secondary | ICD-10-CM | POA: Diagnosis not present

## 2024-09-28 DIAGNOSIS — G8929 Other chronic pain: Secondary | ICD-10-CM

## 2024-09-28 DIAGNOSIS — M19011 Primary osteoarthritis, right shoulder: Secondary | ICD-10-CM | POA: Diagnosis not present

## 2024-09-28 DIAGNOSIS — M25512 Pain in left shoulder: Secondary | ICD-10-CM

## 2024-10-04 ENCOUNTER — Ambulatory Visit: Payer: Self-pay | Admitting: Adult Health

## 2024-10-08 ENCOUNTER — Encounter: Payer: Self-pay | Admitting: Surgical

## 2024-10-08 ENCOUNTER — Ambulatory Visit: Admitting: Surgical

## 2024-10-08 ENCOUNTER — Other Ambulatory Visit

## 2024-10-08 DIAGNOSIS — G8929 Other chronic pain: Secondary | ICD-10-CM

## 2024-10-08 DIAGNOSIS — M25512 Pain in left shoulder: Secondary | ICD-10-CM | POA: Diagnosis not present

## 2024-10-08 DIAGNOSIS — M25511 Pain in right shoulder: Secondary | ICD-10-CM

## 2024-10-08 DIAGNOSIS — M542 Cervicalgia: Secondary | ICD-10-CM

## 2024-10-08 NOTE — Progress Notes (Signed)
 Office Visit Note   Patient: Jesse Arroyo           Date of Birth: Jan 07, 1961           MRN: [id number] Visit Date: 10/08/2024 Requested by: Merna Huxley, NP [address] [place],  [place] [address] PCP: Merna Huxley, NP  Subjective: Chief Complaint  Patient presents with   Left Shoulder - Pain   Right Shoulder - Pain    HPI: Jesse Arroyo is a 63 y.o. male who presents to the office reporting bilateral shoulder pain.  Patient states that he has had pain for several years.  He localizes pain to the trapezial region primarily in the left side but also to a lesser extent the right side.  Also has scapular pain on both sides.  He will have occasional tingling down the left arm all the way down into his left hand from the neck.  Does not keep him up at night.  He is right-hand dominant.  He does note constant daily pain.  It hurts him to ride a bike.  Overall it does not really limit him.  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  No history of prior shoulder or neck surgery.  He has tried Advil and IcyHot as well as Land, massage therapy, physical therapy.  The most helpful thing for him was dry needling and physical therapy and he says that gave him quite a bit of relief for extended periods of time.  He also has recently been prescribed a muscle relaxer by his PCP and feels that this has been helpful for him..                ROS: All systems reviewed are negative as they relate to the chief complaint within the history of present illness.  Patient denies fevers or chills.  Assessment & Plan: Visit Diagnoses:  1. Chronic pain of both shoulders   2. Neck pain     Plan: Impression is 63 year old male who presents for evaluation of longstanding shoulder and neck pain.  He saw his PCP who ordered shoulder x-rays demonstrating no significant abnormality to either shoulder except for a little bit of AC joint arthritis on the left which does not seem  particularly symptomatic for him.  With the reduced cervical spine range of motion as well as the trapezius/scapular region pain and occasional radiating numbness and tingling down the left arm, seems that this is more related to his cervical spine.  He has radiographs taken today demonstrating moderate degenerative changes primarily at C6-7.  After discussion of options, patient is interested in trying cervical spine ESI since other modalities have not provided lasting relief.  In addition to this, I think he would be useful to get him set up for physical therapy and dry needling at [facility] physical therapy who do a lot of dry needling very routinely since his last physical therapist was somewhat against repeating his dry needling, apparently.  We will order MRI cervical spine for further evaluation of primarily left arm radiculopathy and chronic trapezius/scapular pain.  Follow-up after MRI to review results and see how the dry needling has gone and consider setting him up for cervical spine ESI at that point.  Follow-Up Instructions: No follow-ups on file.   Orders:  Orders Placed This Encounter  Procedures   XR Cervical Spine 2 or 3 views   MR Cervical Spine w/o contrast   Ambulatory referral to Physical  Therapy   No orders of the defined types were placed in this encounter.     Procedures: No procedures performed   Clinical Data: No additional findings.  Objective: Vital Signs: There were no vitals taken for this visit.  Physical Exam:  Constitutional: Patient appears well-developed HEENT:  Head: Normocephalic Eyes:EOM are normal Neck: Normal range of motion Cardiovascular: Normal rate Pulmonary/chest: Effort normal Neurologic: Patient is alert Skin: Skin is warm Psychiatric: Patient has normal mood and affect  Ortho Exam: Ortho exam ortho exam demonstrates left shoulder with 80 degrees X rotation, 110 degrees abduction, 170 degrees forward elevation which is compared  with the right shoulder with 80 degrees external rotation, 110 degrees abduction, 170 degrees forward elevation passively.  No obvious deformity to inspection of the shoulder.  Axillary nerve is intact with deltoid firing.  2+ radial pulse of the bilateral upper extremities.  Intact EPL, FPL, finger abduction, pronation/supination, bicep, tricep, deltoid.  Intact shrug to bilateral shoulder with trapezius firing bilaterally.  Rotator cuff strength testing demonstrates excellent rotator cuff strength of supra, infra, subscap bilaterally rated 5/5.  Tender with palpation over [facility] joint mildly on the left but not the right.  Negative crossarm adduction test.  Negative O'Brien sign.  Negative Neer's and Hawkins impingement signs.  No cellulitis or skin changes noted.  No crepitus noted with passive motion of the shoulder.  Negative external rotation lag sign.  Negative Hornblower sign.  Negative Spurling sign.  Positive Lhermitte sign reproducing trapezial pain with a little bit of radiation to the lateral shoulder on the left but not the right.  Pain is reproduced with cervical spine range of motion.  He has decreased cervical spine range of motion particularly with extension and rotation and bending of the cervical spine.  Flexion range of motion is decent.  Specialty Comments:  No specialty comments available.  Imaging: No results found.   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 04/14/2012   Plantar fasciitis 01/20/2012   ERECTILE DYSFUNCTION, ORGANIC 04/09/2010   DISC  DISEASE, LUMBAR 09/28/2008   Essential hypertension 06/25/2008   Allergic rhinitis 06/25/2008   Past Medical History:  Diagnosis Date   Allergy    Colonic mass    Deviated nasal septum 01/14/14   per pt   Hyperlipidemia    Hypertension    Skin abnormalities    feet    Family History  Problem Relation Age of Onset   Lung cancer Father 51   Hypertension Mother    Hypertension Other    Colon cancer Neg Hx    Rectal cancer Neg Hx    Stomach cancer Neg Hx    Pancreatic cancer Neg Hx    Colon polyps Neg Hx    Esophageal cancer Neg Hx     Past Surgical History:  Procedure Laterality Date   COLON RESECTION  2013   COLONOSCOPY     HEMICOLECTOMY  05/10/12   INSERTION OF MESH N/A 01/23/2014   Procedure: OPEN INSERTION OF MESH;  Surgeon: Jina Nephew, MD;  Location: [facility];  Service: General;  Laterality: N/A;  VASECTOMY     VENTRAL HERNIA REPAIR N/A 01/23/2014   Procedure: LAPAROSCOPIC VENTRAL HERNIA REPAIR;  Surgeon: Jina Nephew, MD;  Location: [facility];  Service: General;  Laterality: N/A;   WISDOM TOOTH EXTRACTION     Social History   Occupational History   Occupation: Chief Technology Officer: [employer]  Tobacco Use   Smoking status: Former    Current packs/day: 0.00    Types: Cigarettes    Quit date: 12/27/1993    Years since quitting: 30.8   Smokeless tobacco: Former    Types: Snuff    Quit date: 12/28/2007  Vaping Use   Vaping status: Never Used  Substance and Sexual Activity   Alcohol use: Yes    Alcohol/week: 4.0 standard drinks of alcohol    Types: 4 Glasses of wine per week   Drug use: No   Sexual activity: Not on file

## 2024-10-10 DIAGNOSIS — M25519 Pain in unspecified shoulder: Secondary | ICD-10-CM | POA: Diagnosis not present

## 2024-10-10 DIAGNOSIS — M542 Cervicalgia: Secondary | ICD-10-CM | POA: Diagnosis not present

## 2024-10-12 ENCOUNTER — Other Ambulatory Visit: Payer: Self-pay | Admitting: Adult Health

## 2024-10-17 DIAGNOSIS — M25519 Pain in unspecified shoulder: Secondary | ICD-10-CM | POA: Diagnosis not present

## 2024-10-17 DIAGNOSIS — M542 Cervicalgia: Secondary | ICD-10-CM | POA: Diagnosis not present

## 2024-10-24 DIAGNOSIS — M25519 Pain in unspecified shoulder: Secondary | ICD-10-CM | POA: Diagnosis not present

## 2024-10-24 DIAGNOSIS — M542 Cervicalgia: Secondary | ICD-10-CM | POA: Diagnosis not present

## 2024-10-25 ENCOUNTER — Ambulatory Visit
Admission: RE | Admit: 2024-10-25 | Discharge: 2024-10-25 | Disposition: A | Source: Ambulatory Visit | Attending: Surgical

## 2024-10-25 DIAGNOSIS — M542 Cervicalgia: Secondary | ICD-10-CM

## 2024-10-26 ENCOUNTER — Other Ambulatory Visit: Payer: Self-pay | Admitting: Adult Health

## 2024-10-29 ENCOUNTER — Encounter: Payer: Self-pay | Admitting: Radiology

## 2024-11-01 ENCOUNTER — Encounter: Payer: Self-pay | Admitting: Surgical

## 2024-11-01 ENCOUNTER — Ambulatory Visit: Admitting: Surgical

## 2024-11-01 DIAGNOSIS — M542 Cervicalgia: Secondary | ICD-10-CM | POA: Diagnosis not present

## 2024-11-01 NOTE — Progress Notes (Signed)
 Office Visit Note   Patient: Jesse Arroyo           Date of Birth: Jun 25, 1961           MRN: [id number] Visit Date: 11/01/2024 Requested by: Merna Huxley, NP [address] [place],  [place] [address] PCP: Merna Huxley, NP  Subjective: Chief Complaint  Patient presents with   Other    Follow up to review MRI    HPI: Jesse Arroyo is a 63 y.o. male who presents to the office for MRI review.  Patient states that PT is going well.  Has had dry needling of the right trap region which has significantly helped with this area of his pain.  Has had dry needling in the left trap as well and this has not been as helpful.  Aside from this, no significant change in his symptoms compared with prior visit.  MRI results revealed: MR Cervical Spine w/o contrast Result Date: 10/30/2024 EXAM: MRI CERVICAL SPINE WITHOUT CONTRAST 10/25/2024 04:32:59 PM TECHNIQUE: Multiplanar multisequence MRI of the cervical spine was performed without the administration of intravenous contrast. COMPARISON: Cervical spine radiographs 10/08/2024. CLINICAL HISTORY: Neck pain with Bilateral shoulder pain with tingling for 2 years. FINDINGS: BONES AND ALIGNMENT: Normal alignment. Normal vertebral body heights. Marrow signal is unremarkable. The cervical spinal canal is relatively small on a congenital basis. Superimposed endplate degenerative changes, greatest at C6-C7. There are scattered facet degenerative changes, worst on the right at C3-4 and on the left at C7-T1. SPINAL CORD: Normal spinal cord size. Normal spinal cord signal. SOFT TISSUES: No paraspinal mass. C2-C3: Asymmetric uncinate spurring on the left with mild bilateral facet hypertrophy. No significant spinal stenosis or foraminal narrowing. C3-C4: Spondylosis with posterior osteophytes covering diffusely bulging disc material. There is bilateral uncinate spurring and facet hypertrophy, worse on the right. Mild spinal stenosis with moderate foraminal narrowing  bilaterally. C4-C5: Spondylosis with posterior osteophytes covering diffusely bulging disc material. There is bilateral uncinate spurring and facet hypertrophy. Resulting mild spinal stenosis with moderate foraminal narrowing, greater on the left. C5-C6: Spondylosis with disc bulging and posterior osteophytes. The central spinal canal appears adequately patent at this level. There is mild-to-moderate foraminal narrowing bilaterally. C6-C7: Spondylosis with loss of disc height and posterior osteophytes covering diffusely bulging disc material. No significant central spinal stenosis. Mild-to-moderate foraminal narrowing bilaterally. C7-T1: Spondylosis with loss of disc height and posterior osteophytes, and diffusely bulging disc material. Asymmetric facet hypertrophy on the left with associated subchondral edema. No significant central spinal stenosis. Mild foraminal narrowing bilaterally. IMPRESSION: 1. Multilevel spondylosis superimposed on a congenitally small canal. No acute findings demonstrated. 2. Resulting in mild spinal stenosis at C3-C4 and C4-C5. No cord deformity or abnormal cord signal. 3. Multilevel foraminal narrowing which may contribute to exiting nerve root impingement. Foraminal narrowing appears greatest at C3-4 and C4-5. Electronically signed by: Elsie Perone MD 10/30/2024 10:38 AM EST RP Workstation: [id number]                 ROS: All systems reviewed are negative as they relate to the chief complaint within the history of present illness.  Patient denies fevers or chills.  Assessment & Plan: Visit Diagnoses:  1. Neck pain     Plan: Jesse Arroyo is a 63 y.o. male who presents to the office for review of cervical spine MRI.  MRI demonstrates multilevel spondylosis and foraminal narrowing greatest at C3-4 and C4-5 with symptoms that correlate with stenosis at this region.  Has  had good relief from dry needling and is working with physical therapy on stretching and strengthening  exercises which I think will be helpful.  He does have a spot on the front of his neck where if he puts pressure in the region of the brachial plexus,, his symptoms will temporarily improve.  May benefit from some scalene stretching with physical therapy.  We will plan for cervical spine ESI with Dr. Eldonna and continue with PT and he will let me know how the [facility] affects his symptoms.  If no improvement, then most of his pain is probably myofascial but based on his symptoms I think it is probably a combination between this and the stenosis in his neck.  Follow-Up Instructions: No follow-ups on file.   Orders:  Orders Placed This Encounter  Procedures   Ambulatory referral to Physical Medicine Rehab   No orders of the defined types were placed in this encounter.     Procedures: No procedures performed   Clinical Data: No additional findings.  Objective: Vital Signs: There were no vitals taken for this visit.  Physical Exam:  Constitutional: Patient appears well-developed HEENT:  Head: Normocephalic Eyes:EOM are normal Neck: Normal range of motion Cardiovascular: Normal rate Pulmonary/chest: Effort normal Neurologic: Patient is alert Skin: Skin is warm Psychiatric: Patient has normal mood and affect  Ortho Exam: Ortho exam demonstrates no change compared with prior exam.  Specialty Comments:  No specialty comments available.  Imaging: No results found.   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 04/14/2012   Plantar fasciitis 01/20/2012    ERECTILE DYSFUNCTION, ORGANIC 04/09/2010   DISC DISEASE, LUMBAR 09/28/2008   Essential hypertension 06/25/2008   Allergic rhinitis 06/25/2008   Past Medical History:  Diagnosis Date   Allergy    Colonic mass    Deviated nasal septum 01/14/14   per pt   Hyperlipidemia    Hypertension    Skin abnormalities    feet    Family History  Problem Relation Age of Onset   Lung cancer Father 100   Hypertension Mother    Hypertension Other    Colon cancer Neg Hx    Rectal cancer Neg Hx    Stomach cancer Neg Hx    Pancreatic cancer Neg Hx    Colon polyps Neg Hx    Esophageal cancer Neg Hx     Past Surgical History:  Procedure Laterality Date   COLON RESECTION  2013   COLONOSCOPY     HEMICOLECTOMY  05/10/12   INSERTION OF MESH N/A 01/23/2014   Procedure: OPEN INSERTION OF MESH;  Surgeon: Jina Nephew, MD;  Location: [facility];  Service: General;  Laterality: N/A;   VASECTOMY     VENTRAL HERNIA REPAIR N/A 01/23/2014   Procedure: LAPAROSCOPIC VENTRAL HERNIA REPAIR;  Surgeon: Jina Nephew, MD;  Location: [facility];  Service: General;  Laterality: N/A;   WISDOM TOOTH EXTRACTION     Social History   Occupational History   Occupation: Chief Technology Officer: [employer]  Tobacco Use   Smoking status: Former  Current packs/day: 0.00    Types: Cigarettes    Quit date: 12/27/1993    Years since quitting: 30.8   Smokeless tobacco: Former    Types: Snuff    Quit date: 12/28/2007  Vaping Use   Vaping status: Never Used  Substance and Sexual Activity   Alcohol use: Yes    Alcohol/week: 4.0 standard drinks of alcohol    Types: 4 Glasses of wine per week   Drug use: No   Sexual activity: Not on file

## 2024-12-04 ENCOUNTER — Other Ambulatory Visit: Payer: Self-pay

## 2024-12-04 ENCOUNTER — Ambulatory Visit: Admitting: Physical Medicine and Rehabilitation

## 2024-12-04 VITALS — BP 156/84 | HR 58

## 2024-12-04 DIAGNOSIS — M5412 Radiculopathy, cervical region: Secondary | ICD-10-CM

## 2024-12-04 MED ORDER — METHYLPREDNISOLONE ACETATE 40 MG/ML IJ SUSP
40.0000 mg | Freq: Once | INTRAMUSCULAR | Status: AC
  Administered 2024-12-04: 40 mg

## 2024-12-04 NOTE — Progress Notes (Unsigned)
 Pain Scale   Average Pain 4 Patient advising he has chronic neck pain radiating to bilateral shoulders,pain is constant        +Driver, -BT, -Dye Allergies.

## 2024-12-17 NOTE — Progress Notes (Signed)
 "  RIEN MARLAND - 63 y.o. male MRN [id number]  Date of birth: January 03, 1961  Office Visit Note: Visit Date: 12/04/2024 PCP: Merna Huxley, NP Referred by: Merna Huxley, NP  Subjective: Chief Complaint  Patient presents with   Neck - Pain   HPI:  LARENZO CAPLES is a 63 y.o. male who comes in today at the request of Herlene Calix, PA-C for planned Left C7-T1 Cervical Interlaminar epidural steroid injection with fluoroscopic guidance.  The patient has failed conservative care including home exercise, medications, time and activity modification.  This injection will be diagnostic and hopefully therapeutic.  Please see requesting physician notes for further details and justification.   ROS Otherwise per HPI.  Assessment & Plan: Visit Diagnoses:    ICD-10-CM   1. Cervical radiculopathy  M54.12 XR C-ARM NO REPORT    Epidural Steroid injection    methylPREDNISolone  acetate (DEPO-MEDROL ) injection 40 mg      Plan: No additional findings.   Meds & Orders:  Meds ordered this encounter  Medications   methylPREDNISolone  acetate (DEPO-MEDROL ) injection 40 mg    Orders Placed This Encounter  Procedures   XR C-ARM NO REPORT   Epidural Steroid injection    Follow-up: Return for visit to requesting provider as needed.   Procedures: No procedures performed  Cervical Epidural Steroid Injection - Interlaminar Approach with Fluoroscopic Guidance  Patient: AMARION PORTELL      Date of Birth: August 30, 1961 MRN: [id number] PCP: Merna Huxley, NP      Visit Date: 12/04/2024   Universal Protocol:    Date/Time: 12/22/20255:07 AM  Consent Given By: the patient  Position: PRONE  Additional Comments: Vital signs were monitored before and after the procedure. Patient was prepped and draped in the usual sterile fashion. The correct patient, procedure, and site was verified.   Injection Procedure Details:   Procedure diagnoses: Cervical radiculopathy [M54.12]    Meds Administered:  Meds  ordered this encounter  Medications   methylPREDNISolone  acetate (DEPO-MEDROL ) injection 40 mg     Laterality: Left  Location/Site: C7-T1  Needle: 3.5 in., 20 ga. Tuohy  Needle Placement: Paramedian epidural space  Findings:  -Comments: Excellent flow of contrast into the epidural space.  Procedure Details: Using a paramedian approach from the side mentioned above, the region overlying the inferior lamina was localized under fluoroscopic visualization and the soft tissues overlying this structure were infiltrated with 4 ml. of 1% Lidocaine  without Epinephrine . A # 20 gauge, Tuohy needle was inserted into the epidural space using a paramedian approach.  The epidural space was localized using loss of resistance along with contralateral oblique bi-planar fluoroscopic views.  After negative aspirate for air, blood, and CSF, a 2 ml. volume of Isovue -250 was injected into the epidural space and the flow of contrast was observed. Radiographs were obtained for documentation purposes.   The injectate was administered into the level noted above.  Additional Comments:  The patient tolerated the procedure well Dressing: 2 x 2 sterile gauze and Band-Aid    Post-procedure details: Patient was observed during the procedure. Post-procedure instructions were reviewed.  Patient left the clinic in stable condition.   Clinical History: EXAM: MRI CERVICAL SPINE WITHOUT CONTRAST 10/25/2024 04:32:59 PM   TECHNIQUE: Multiplanar multisequence MRI of the cervical spine was performed without the administration of intravenous contrast.   COMPARISON: Cervical spine radiographs 10/08/2024.   CLINICAL HISTORY: Neck pain with Bilateral shoulder pain with tingling for 2 years.   FINDINGS:   BONES AND ALIGNMENT: Normal  alignment. Normal vertebral body heights. Marrow signal is unremarkable. The cervical spinal canal is relatively small on a congenital basis. Superimposed endplate degenerative  changes, greatest at C6-C7. There are scattered facet degenerative changes, worst on the right at C3-4 and on the left at C7-T1.   SPINAL CORD: Normal spinal cord size. Normal spinal cord signal.   SOFT TISSUES: No paraspinal mass.   C2-C3: Asymmetric uncinate spurring on the left with mild bilateral facet hypertrophy. No significant spinal stenosis or foraminal narrowing.   C3-C4: Spondylosis with posterior osteophytes covering diffusely bulging disc material. There is bilateral uncinate spurring and facet hypertrophy, worse on the right. Mild spinal stenosis with moderate foraminal narrowing bilaterally.   C4-C5: Spondylosis with posterior osteophytes covering diffusely bulging disc material. There is bilateral uncinate spurring and facet hypertrophy. Resulting mild spinal stenosis with moderate foraminal narrowing, greater on the left.   C5-C6: Spondylosis with disc bulging and posterior osteophytes. The central spinal canal appears adequately patent at this level. There is mild-to-moderate foraminal narrowing bilaterally.   C6-C7: Spondylosis with loss of disc height and posterior osteophytes covering diffusely bulging disc material. No significant central spinal stenosis. Mild-to-moderate foraminal narrowing bilaterally.   C7-T1: Spondylosis with loss of disc height and posterior osteophytes, and diffusely bulging disc material. Asymmetric facet hypertrophy on the left with associated subchondral edema. No significant central spinal stenosis. Mild foraminal narrowing bilaterally.   IMPRESSION: 1. Multilevel spondylosis superimposed on a congenitally small canal. No acute findings demonstrated. 2. Resulting in mild spinal stenosis at C3-C4 and C4-C5. No cord deformity or abnormal cord signal. 3. Multilevel foraminal narrowing which may contribute to exiting nerve root impingement. Foraminal narrowing appears greatest at C3-4 and C4-5.   Electronically signed by:  Elsie Perone MD 10/30/2024 10:38 AM EST RP Workstation: [id number]     Objective:  VS:  HT:    WT:   BMI:     BP:(!) 156/84  HR:(!) 58bpm  TEMP: ( )  RESP:  Physical Exam Vitals and nursing note reviewed.  Constitutional:      General: He is not in acute distress.    Appearance: Normal appearance. He is not ill-appearing.  HENT:     Head: Normocephalic and atraumatic.     Right Ear: External ear normal.     Left Ear: External ear normal.  Eyes:     Extraocular Movements: Extraocular movements intact.  Cardiovascular:     Rate and Rhythm: Normal rate.     Pulses: Normal pulses.  Abdominal:     General: There is no distension.     Palpations: Abdomen is soft.  Musculoskeletal:        General: No signs of injury.     Cervical back: Neck supple. Tenderness present. No rigidity.     Right lower leg: No edema.     Left lower leg: No edema.     Comments: Patient has good strength in the upper extremities with 5 out of 5 strength in wrist extension long finger flexion APB.  No intrinsic hand muscle atrophy.  Negative Hoffmann's test.  Lymphadenopathy:     Cervical: No cervical adenopathy.  Skin:    Findings: No erythema or rash.  Neurological:     General: No focal deficit present.     Mental Status: He is alert and oriented to person, place, and time.     Sensory: No sensory deficit.     Motor: No weakness or abnormal muscle tone.     Coordination: Coordination normal.  Psychiatric:        Mood and Affect: Mood normal.        Behavior: Behavior normal.      Imaging: No results found. "

## 2024-12-17 NOTE — Procedures (Signed)
 Cervical Epidural Steroid Injection - Interlaminar Approach with Fluoroscopic Guidance  Patient: Jesse Arroyo      Date of Birth: [birth year] MRN: [id number] PCP: Merna Huxley, NP      Visit Date: 12/04/2024   Universal Protocol:    Date/Time: 12/22/20255:07 AM  Consent Given By: the patient  Position: PRONE  Additional Comments: Vital signs were monitored before and after the procedure. Patient was prepped and draped in the usual sterile fashion. The correct patient, procedure, and site was verified.   Injection Procedure Details:   Procedure diagnoses: Cervical radiculopathy [M54.12]    Meds Administered:  Meds ordered this encounter  Medications   methylPREDNISolone  acetate (DEPO-MEDROL ) injection 40 mg     Laterality: Left  Location/Site: C7-T1  Needle: 3.5 in., 20 ga. Tuohy  Needle Placement: Paramedian epidural space  Findings:  -Comments: Excellent flow of contrast into the epidural space.  Procedure Details: Using a paramedian approach from the side mentioned above, the region overlying the inferior lamina was localized under fluoroscopic visualization and the soft tissues overlying this structure were infiltrated with 4 ml. of 1% Lidocaine  without Epinephrine . A # 20 gauge, Tuohy needle was inserted into the epidural space using a paramedian approach.  The epidural space was localized using loss of resistance along with contralateral oblique bi-planar fluoroscopic views.  After negative aspirate for air, blood, and CSF, a 2 ml. volume of Isovue -250 was injected into the epidural space and the flow of contrast was observed. Radiographs were obtained for documentation purposes.   The injectate was administered into the level noted above.  Additional Comments:  The patient tolerated the procedure well Dressing: 2 x 2 sterile gauze and Band-Aid    Post-procedure details: Patient was observed during the procedure. Post-procedure instructions were  reviewed.  Patient left the clinic in stable condition.

## 2025-01-10 ENCOUNTER — Ambulatory Visit: Admitting: Adult Health

## 2025-01-10 ENCOUNTER — Encounter: Payer: Self-pay | Admitting: Adult Health

## 2025-01-10 ENCOUNTER — Ambulatory Visit: Payer: Self-pay | Admitting: Adult Health

## 2025-01-10 VITALS — BP 130/82 | HR 71 | Temp 98.4°F | Ht 72.0 in | Wt 193.0 lb

## 2025-01-10 DIAGNOSIS — Z0000 Encounter for general adult medical examination without abnormal findings: Secondary | ICD-10-CM | POA: Diagnosis not present

## 2025-01-10 DIAGNOSIS — R3912 Poor urinary stream: Secondary | ICD-10-CM | POA: Diagnosis not present

## 2025-01-10 DIAGNOSIS — N401 Enlarged prostate with lower urinary tract symptoms: Secondary | ICD-10-CM | POA: Diagnosis not present

## 2025-01-10 DIAGNOSIS — M5412 Radiculopathy, cervical region: Secondary | ICD-10-CM

## 2025-01-10 DIAGNOSIS — I10 Essential (primary) hypertension: Secondary | ICD-10-CM | POA: Diagnosis not present

## 2025-01-10 DIAGNOSIS — E782 Mixed hyperlipidemia: Secondary | ICD-10-CM

## 2025-01-10 LAB — CBC
HCT: 43 % (ref 39.0–52.0)
Hemoglobin: 14.6 g/dL (ref 13.0–17.0)
MCHC: 34 g/dL (ref 30.0–36.0)
MCV: 90.6 fl (ref 78.0–100.0)
Platelets: 216 K/uL (ref 150.0–400.0)
RBC: 4.75 Mil/uL (ref 4.22–5.81)
RDW: 14.1 % (ref 11.5–15.5)
WBC: 6 K/uL (ref 4.0–10.5)

## 2025-01-10 LAB — LIPID PANEL
Cholesterol: 126 mg/dL (ref 28–200)
HDL: 53.5 mg/dL
LDL Cholesterol: 50 mg/dL (ref 10–99)
NonHDL: 72.06
Total CHOL/HDL Ratio: 2
Triglycerides: 112 mg/dL (ref 10.0–149.0)
VLDL: 22.4 mg/dL (ref 0.0–40.0)

## 2025-01-10 LAB — COMPREHENSIVE METABOLIC PANEL WITH GFR
ALT: 25 U/L (ref 3–53)
AST: 25 U/L (ref 5–37)
Albumin: 4.3 g/dL (ref 3.5–5.2)
Alkaline Phosphatase: 39 U/L (ref 39–117)
BUN: 17 mg/dL (ref 6–23)
CO2: 29 meq/L (ref 19–32)
Calcium: 9.2 mg/dL (ref 8.4–10.5)
Chloride: 106 meq/L (ref 96–112)
Creatinine, Ser: 1.03 mg/dL (ref 0.40–1.50)
GFR: 76.13 mL/min
Glucose, Bld: 71 mg/dL (ref 70–99)
Potassium: 4.1 meq/L (ref 3.5–5.1)
Sodium: 142 meq/L (ref 135–145)
Total Bilirubin: 0.6 mg/dL (ref 0.2–1.2)
Total Protein: 6.6 g/dL (ref 6.0–8.3)

## 2025-01-10 LAB — TSH: TSH: 2.77 u[IU]/mL (ref 0.35–5.50)

## 2025-01-10 LAB — PSA: PSA: 1.4 ng/mL (ref 0.10–4.00)

## 2025-01-10 MED ORDER — ROSUVASTATIN CALCIUM 10 MG PO TABS: 10.0000 mg | ORAL_TABLET | Freq: Every day | ORAL | 3 refills | Status: AC

## 2025-01-10 NOTE — Progress Notes (Signed)
 "  Subjective:    Patient ID: Jesse Arroyo, male    DOB: 1961/09/15, 64 y.o.   MRN: [id number]  HPI Patient presents for yearly preventative medicine examination. He is a pleasant 64 year old male who  has a past medical history of Allergy, Colonic mass, Deviated nasal septum (01/14/14), Hyperlipidemia, Hypertension, and Skin abnormalities.  Essential hypertension-currently prescribed lisinopril /hydrochlorothiazide  20-12.5 mg.  He denies dizziness, lightheadedness, chest pain, shortness of breath, or headaches. He does not check his blood pressure at home.. BP Readings from Last 3 Encounters:  01/10/25 130/82  12/04/24 (!) 156/84  09/26/24 120/70   Hyperlipidemia-takes Crestor  5 mg daily.  He denies myalgia or fatigue Lab Results  Component Value Date   CHOL 122 06/23/2023   HDL 51.10 06/23/2023   LDLCALC 56 06/23/2023   LDLDIRECT 110.0 02/03/2016   TRIG 75.0 06/23/2023   CHOLHDL 2 06/23/2023   BPH - has decreased stream but does not find this enough to warrant medication   Cervical Radiculpathy -he was seen by orthopedics in October 2025 for bilateral shoulder pain that had been present for several years.  Primarily on the left side and a lesser extent on the right side.  He did have an x-ray of the left shoulder which was unremarkable except for some mild arthritis.  Orthopedics thought that pain was likely coming from the cervical spine and an MRI was done which demonstrated multilevel spondylosis and foraminal narrowing greatest at C3-4 and C4-5 with symptoms that correlate with stenosis at this region.  He was referred to Dr. Eldonna for epidural injection and went through with this but reports that it did not help much.  He had better relief with dry needling with physical therapy but insurance stopped paying for it, for the time being.  He is doing stretching exercises at home and is going to try going back to the massage therapy to see if that.  All immunizations and health  maintenance protocols were reviewed with the patient and needed orders were placed. He will not get his pneumonia vaccination today.   Appropriate screening laboratory values were ordered for the patient including screening of hyperlipidemia, renal function and hepatic function. If indicated by BPH, a PSA was ordered.  Medication reconciliation,  past medical history, social history, problem list and allergies were reviewed in detail with the patient  Goals were established with regard to weight loss, exercise, and  diet in compliance with medications Wt Readings from Last 3 Encounters:  01/10/25 193 lb (87.5 kg)  09/26/24 192 lb (87.1 kg)  10/28/23 184 lb (83.5 kg)   He is up to date on routine colon cancer screening    Review of Systems  Constitutional: Negative.   HENT: Negative.    Eyes: Negative.   Respiratory: Negative.    Cardiovascular: Negative.   Gastrointestinal: Negative.   Endocrine: Negative.   Genitourinary: Negative.   Musculoskeletal:  Positive for arthralgias.  Skin: Negative.   Allergic/Immunologic: Negative.   Neurological: Negative.   Hematological: Negative.   Psychiatric/Behavioral: Negative.    All other systems reviewed and are negative.  Past Medical History:  Diagnosis Date   Allergy    Colonic mass    Deviated nasal septum 01/14/14   per pt   Hyperlipidemia    Hypertension    Skin abnormalities    feet    Social History   Socioeconomic History   Marital status: Married    Spouse name: Not on file   Number of children:  2   Years of education: Not on file   Highest education level: Not on file  Occupational History   Occupation: Publishing Rights Manager    Employer: [employer]  Tobacco Use   Smoking status: Former    Current packs/day: 0.00    Types: Cigarettes    Quit date: 12/27/1993    Years since quitting: 31.0   Smokeless tobacco: Former    Types: Snuff    Quit date: 12/28/2007  Vaping Use   Vaping status: Never  Used  Substance and Sexual Activity   Alcohol use: Yes    Alcohol/week: 4.0 standard drinks of alcohol    Types: 4 Glasses of wine per week   Drug use: No   Sexual activity: Not on file  Other Topics Concern   Not on file  Social History Narrative   Married   Two daughters - One lives at home.       Social Drivers of Health   Tobacco Use: Medium Risk (01/10/2025)   Patient History    Smoking Tobacco Use: Former    Smokeless Tobacco Use: Former    Passive Exposure: Not on Stage Manager: Not on file  Food Insecurity: Not on file  Transportation Needs: Not on file  Physical Activity: Not on file  Stress: Not on file  Social Connections: Not on file  Intimate Partner Violence: Not on file  Depression (PHQ2-9): Low Risk (09/26/2024)   Depression (PHQ2-9)    PHQ-2 Score: 0  Alcohol Screen: Not on file  Housing: Not on file  Utilities: Not on file  Health Literacy: Not on file    Past Surgical History:  Procedure Laterality Date   COLON RESECTION  2013   COLONOSCOPY     HEMICOLECTOMY  05/10/12   INSERTION OF MESH N/A 01/23/2014   Procedure: OPEN INSERTION OF MESH;  Surgeon: Jina Nephew, MD;  Location: [facility];  Service: General;  Laterality: N/A;   VASECTOMY     VENTRAL HERNIA REPAIR N/A 01/23/2014   Procedure: LAPAROSCOPIC VENTRAL HERNIA REPAIR;  Surgeon: Jina Nephew, MD;  Location: [facility];  Service: General;  Laterality: N/A;   WISDOM TOOTH EXTRACTION      Family History  Problem Relation Age of Onset   Lung cancer Father 70   Hypertension Mother    Hypertension Other    Colon cancer Neg Hx    Rectal cancer Neg Hx    Stomach cancer Neg Hx    Pancreatic cancer Neg Hx    Colon polyps Neg Hx    Esophageal cancer Neg Hx     Allergies[1]  Medications Ordered Prior to Encounter[2]  BP 130/82   Pulse 71   Temp 98.4 F (36.9 C) (Oral)   Ht 6' (1.829 m)   Wt 193 lb (87.5 kg)   SpO2 97%   BMI 26.18 kg/m       Objective:   Physical  Exam Vitals and nursing note reviewed.  Constitutional:      General: He is not in acute distress.    Appearance: Normal appearance. He is not ill-appearing.  HENT:     Head: Normocephalic and atraumatic.     Right Ear: Tympanic membrane, ear canal and external ear normal. There is no impacted cerumen.     Left Ear: Tympanic membrane, ear canal and external ear normal. There is no impacted cerumen.     Nose: Nose normal. No congestion or rhinorrhea.     Mouth/Throat:  Mouth: Mucous membranes are moist.     Pharynx: Oropharynx is clear.  Eyes:     Extraocular Movements: Extraocular movements intact.     Conjunctiva/sclera: Conjunctivae normal.     Pupils: Pupils are equal, round, and reactive to light.  Neck:     Vascular: No carotid bruit.  Cardiovascular:     Rate and Rhythm: Normal rate and regular rhythm.     Pulses: Normal pulses.     Heart sounds: No murmur heard.    No friction rub. No gallop.  Pulmonary:     Effort: Pulmonary effort is normal.     Breath sounds: Normal breath sounds.  Abdominal:     General: Abdomen is flat. Bowel sounds are normal. There is no distension.     Palpations: Abdomen is soft. There is no mass.     Tenderness: There is no abdominal tenderness. There is no guarding or rebound.     Hernia: No hernia is present.  Musculoskeletal:        General: Tenderness (left trapezius) present. Normal range of motion.     Cervical back: Normal range of motion and neck supple.  Lymphadenopathy:     Cervical: No cervical adenopathy.  Skin:    General: Skin is warm and dry.     Capillary Refill: Capillary refill takes less than 2 seconds.  Neurological:     General: No focal deficit present.     Mental Status: He is alert and oriented to person, place, and time.  Psychiatric:        Mood and Affect: Mood normal.        Behavior: Behavior normal.        Thought Content: Thought content normal.        Judgment: Judgment normal.        Assessment &  Plan:  1. Routine general medical examination at a health care facility (Primary) Today patient counseled on age appropriate routine health concerns for screening and prevention, each reviewed and up to date or declined. Immunizations reviewed and up to date or declined. Labs ordered and reviewed. Risk factors for depression reviewed and negative. Hearing function and visual acuity are intact. ADLs screened and addressed as needed. Functional ability and level of safety reviewed and appropriate. Education, counseling and referrals performed based on assessed risks today. Patient provided with a copy of personalized plan for preventive services. Follow up in one year or sooner if needed  2. Essential hypertension - Controlled. Continue with current medication  - Lipid panel; Future - TSH; Future - CBC; Future - Comprehensive metabolic panel with GFR; Future - Comprehensive metabolic panel with GFR - CBC - TSH - Lipid panel  3. Mixed hyperlipidemia - Continue with Crestor  10 mg daily  - Lipid panel; Future - TSH; Future - CBC; Future - Comprehensive metabolic panel with GFR; Future - Comprehensive metabolic panel with GFR - CBC - TSH - Lipid panel  4. Benign prostatic hyperplasia with weak urinary stream  - PSA; Future - PSA  5. Cervical radiculopathy - Encouraged massage therapy but also follow up with orthopedics. He may want to try a second epidural injection.   Darleene Shape, NP       [1] No Known Allergies [2]  Current Outpatient Medications on File Prior to Visit  Medication Sig Dispense Refill   Ascorbic Acid (VITAMIN C PO) Take 1 tablet by mouth daily.     aspirin 81 MG tablet Take 81 mg by mouth daily with breakfast.  dicyclomine  (BENTYL ) 10 MG capsule TAKE 1 CAPSULE (10 MG TOTAL) BY MOUTH 4 (FOUR) TIMES DAILY - BEFORE MEALS AND AT BEDTIME. 360 capsule 1   Ibuprofen (ADVIL PO) Take by mouth 3 (three) times a week.     lisinopril -hydrochlorothiazide   (ZESTORETIC ) 20-12.5 MG tablet TAKE 1 TABLET BY MOUTH EVERY DAY 90 tablet 1   Multiple Vitamin (MULTIVITAMIN) tablet Take 1 tablet by mouth daily with breakfast.      rosuvastatin  (CRESTOR ) 10 MG tablet TAKE 1 TABLET (10 MG TOTAL) BY MOUTH DAILY. NO FURTHER REFILLS UNTIL SEE FOR CPE 90 tablet 0   Zinc 50 MG TABS Take by mouth.     fluticasone (FLONASE) 50 MCG/ACT nasal spray SMARTSIG:1-2 Spray(s) Both Nares Daily     methylPREDNISolone  (MEDROL  DOSEPAK) 4 MG TBPK tablet Take as directed 21 tablet 0   No current facility-administered medications on file prior to visit.   "

## 2025-01-10 NOTE — Patient Instructions (Signed)
 It was great seeing you today   We will follow up with you regarding your lab work   Please let me know if you need anything   Please follow up with Orthopedics
